# Patient Record
Sex: Female | Born: 1970
Health system: Southern US, Community
[De-identification: ages and names within clinical notes are randomized; demographics above are authoritative.]

## PROBLEM LIST (undated history)

## (undated) DIAGNOSIS — I2699 Other pulmonary embolism without acute cor pulmonale: Secondary | ICD-10-CM

## (undated) DIAGNOSIS — I82409 Acute embolism and thrombosis of unspecified deep veins of unspecified lower extremity: Secondary | ICD-10-CM

## (undated) DIAGNOSIS — R079 Chest pain, unspecified: Secondary | ICD-10-CM

## (undated) HISTORY — DX: Chest pain, unspecified: R07.9

## (undated) HISTORY — PX: NO PAST SURGERIES: SHX2092

---

## 1999-04-27 ENCOUNTER — Other Ambulatory Visit: Admission: RE | Admit: 1999-04-27 | Discharge: 1999-04-27 | Payer: Self-pay | Admitting: Obstetrics & Gynecology

## 2000-05-11 ENCOUNTER — Other Ambulatory Visit: Admission: RE | Admit: 2000-05-11 | Discharge: 2000-05-11 | Payer: Self-pay | Admitting: Obstetrics & Gynecology

## 2002-11-07 ENCOUNTER — Other Ambulatory Visit: Admission: RE | Admit: 2002-11-07 | Discharge: 2002-11-07 | Payer: Self-pay | Admitting: Obstetrics & Gynecology

## 2002-11-12 ENCOUNTER — Encounter: Admission: RE | Admit: 2002-11-12 | Discharge: 2002-11-12 | Payer: Self-pay | Admitting: Obstetrics & Gynecology

## 2002-11-12 ENCOUNTER — Encounter: Payer: Self-pay | Admitting: Obstetrics & Gynecology

## 2003-12-11 ENCOUNTER — Other Ambulatory Visit: Admission: RE | Admit: 2003-12-11 | Discharge: 2003-12-11 | Payer: Self-pay | Admitting: Obstetrics & Gynecology

## 2005-08-09 ENCOUNTER — Ambulatory Visit (HOSPITAL_COMMUNITY): Admission: RE | Admit: 2005-08-09 | Discharge: 2005-08-09 | Payer: Self-pay | Admitting: Obstetrics & Gynecology

## 2010-02-24 ENCOUNTER — Encounter: Admission: RE | Admit: 2010-02-24 | Discharge: 2010-02-24 | Payer: Self-pay | Admitting: Obstetrics & Gynecology

## 2013-07-03 ENCOUNTER — Emergency Department (INDEPENDENT_AMBULATORY_CARE_PROVIDER_SITE_OTHER)
Admission: EM | Admit: 2013-07-03 | Discharge: 2013-07-03 | Disposition: A | Discharge status: Hospice | Payer: Self-pay | Source: Home / Self Care | Attending: Emergency Medicine | Admitting: Emergency Medicine

## 2013-07-03 ENCOUNTER — Encounter (HOSPITAL_COMMUNITY): Payer: Self-pay | Admitting: *Deleted

## 2013-07-03 ENCOUNTER — Emergency Department (HOSPITAL_COMMUNITY)
Admission: EM | Admit: 2013-07-03 | Discharge: 2013-07-04 | Disposition: A | Discharge status: Home / Self Care | Payer: Self-pay | Attending: Emergency Medicine | Admitting: Emergency Medicine

## 2013-07-03 DIAGNOSIS — Z79899 Other long term (current) drug therapy: Secondary | ICD-10-CM | POA: Insufficient documentation

## 2013-07-03 DIAGNOSIS — M7989 Other specified soft tissue disorders: Secondary | ICD-10-CM | POA: Insufficient documentation

## 2013-07-03 LAB — CBC WITH DIFFERENTIAL/PLATELET
Basophils Relative: 0 % (ref 0–1)
Eosinophils Absolute: 0.4 10*3/uL (ref 0.0–0.7)
HCT: 32.3 % — ABNORMAL LOW (ref 36.0–46.0)
Hemoglobin: 11.3 g/dL — ABNORMAL LOW (ref 12.0–15.0)
MCH: 29.4 pg (ref 26.0–34.0)
MCHC: 35 g/dL (ref 30.0–36.0)
Monocytes Absolute: 0.7 10*3/uL (ref 0.1–1.0)
Monocytes Relative: 8 % (ref 3–12)

## 2013-07-03 LAB — COMPREHENSIVE METABOLIC PANEL
Albumin: 3.7 g/dL (ref 3.5–5.2)
BUN: 11 mg/dL (ref 6–23)
Creatinine, Ser: 0.62 mg/dL (ref 0.50–1.10)
Total Bilirubin: 0.3 mg/dL (ref 0.3–1.2)
Total Protein: 7.4 g/dL (ref 6.0–8.3)

## 2013-07-03 LAB — POCT I-STAT TROPONIN I: Troponin i, poc: 0.01 ng/mL (ref 0.00–0.08)

## 2013-07-03 NOTE — ED Provider Notes (Addendum)
  CSN: [id number]     Arrival date & time 07/03/13  1702 History     First MD Initiated Contact with Patient 07/03/13 1901     Chief Complaint  Patient presents with  . Leg Swelling   (Consider location/radiation/quality/duration/timing/severity/associated sxs/prior Treatment) HPI Comments: Patient presents urgent care with left lower leg swelling and pain has been worsening over the last 3 weeks. Initially patient was diagnosed and treated for gout including chiropractor sessions. She has noticed that her leg has gotten progressively worse with more pain and swelling in described that her mother had a DVT and a pulmonary embolism and have recommended she come to urgent care tonight to be worked up for a DVT.  Patient at this point denies any chest pains or shortness of breath denies having a personal history of DVT or PE or any blood dyscrasias. She is having more difficulty walking on her leg  Patient is a 42 y.o. female presenting with leg pain. The history is provided by the patient.  Leg Pain Location:  Leg Leg location:  L leg Pain details:    Quality:  Aching   Severity:  Moderate   Onset quality:  Sudden   Timing:  Constant   Progression:  Worsening Chronicity:  New Ineffective treatments:  None tried Associated symptoms: swelling   Associated symptoms: no back pain, no fatigue, no fever and no numbness     No past medical history on file. No past surgical history on file. No family history on file. History  Substance Use Topics  . Smoking status: Not on file  . Smokeless tobacco: Not on file  . Alcohol Use: Not on file   OB History   No data available     Review of Systems  Constitutional: Positive for activity change. Negative for fever and fatigue.  Musculoskeletal: Negative for back pain.    Allergies  Review of patient's allergies indicates no known allergies.  Home Medications   Current Outpatient Rx  Name  Route  Sig  Dispense  Refill  .  acetaminophen (TYLENOL) 325 MG tablet   Oral   Take 650 mg by mouth every 6 (six) hours as needed for pain.          BP 140/91  Pulse 80  Temp(Src) 99.4 F (37.4 C) (Oral)  Resp 18  SpO2 97%  LMP 06/12/2013 Physical Exam  Constitutional: No distress.  HENT:  Head: Normocephalic.  Pulmonary/Chest: Effort normal. No respiratory distress. She has no wheezes. She has no rales.  Musculoskeletal: She exhibits tenderness.       Legs: Skin: No erythema.    ED Course   Procedures (including critical care time)  Labs Reviewed - No data to display No results found. 1. Leg swelling     MDM  Left lower extremity swelling and pain-   Physical exam in retrospect are consistent with a DVT patient will be transferred to the emergency department for further evaluation difficult venous Dopplers to be initiated on the DVT protocol. Consider possibility of extensive DVT patient has a significant circumferential diameter difference. Denies chest pain or dyspnea.  Jimmie Molly, MD 07/03/13 Kristopher Oppenheim  Jimmie Molly, MD 07/03/13 [phone]

## 2013-07-03 NOTE — ED Notes (Signed)
C/o left leg swelling which started a couple of weeks ago.  Patient thought it was gout.  Denies any injury.   OTC medications pain medications taken but no relief.  Patient saw a chiropractor 06/24/13 and was told it was gout

## 2013-07-03 NOTE — ED Notes (Signed)
NURSE FIRST ROUNDS : NURSES UPDATED PT. ON WAIT TIME / DELAY AND PROCESS. ALERT /RESPIRATIONS UNLABORED / DENIES PAIN AT THIS TIME .

## 2013-07-03 NOTE — ED Notes (Signed)
The pts lt leg is swollen and painful for 2 weeks.  She was sent down from [facility] for treatment.  She has also had some sl sob when walking  None now.  lmp July 23rd

## 2013-07-04 ENCOUNTER — Encounter (HOSPITAL_COMMUNITY): Payer: Self-pay | Admitting: Radiology

## 2013-07-04 ENCOUNTER — Ambulatory Visit (HOSPITAL_COMMUNITY)
Admission: RE | Admit: 2013-07-04 | Discharge: 2013-07-04 | Disposition: A | Discharge status: Home / Self Care | Payer: Self-pay | Source: Ambulatory Visit | Attending: Emergency Medicine | Admitting: Emergency Medicine

## 2013-07-04 ENCOUNTER — Emergency Department (HOSPITAL_COMMUNITY): Payer: Self-pay

## 2013-07-04 ENCOUNTER — Inpatient Hospital Stay (HOSPITAL_COMMUNITY)
Admission: EM | Admit: 2013-07-04 | Discharge: 2013-07-05 | DRG: 176 | Disposition: A | Discharge status: Home / Self Care | Payer: MEDICAID | Attending: Internal Medicine | Admitting: Internal Medicine

## 2013-07-04 DIAGNOSIS — D649 Anemia, unspecified: Secondary | ICD-10-CM | POA: Diagnosis present

## 2013-07-04 DIAGNOSIS — I82409 Acute embolism and thrombosis of unspecified deep veins of unspecified lower extremity: Secondary | ICD-10-CM | POA: Diagnosis present

## 2013-07-04 DIAGNOSIS — M7989 Other specified soft tissue disorders: Secondary | ICD-10-CM | POA: Insufficient documentation

## 2013-07-04 DIAGNOSIS — M79609 Pain in unspecified limb: Secondary | ICD-10-CM | POA: Insufficient documentation

## 2013-07-04 DIAGNOSIS — I2699 Other pulmonary embolism without acute cor pulmonale: Principal | ICD-10-CM | POA: Diagnosis present

## 2013-07-04 DIAGNOSIS — I82402 Acute embolism and thrombosis of unspecified deep veins of left lower extremity: Secondary | ICD-10-CM

## 2013-07-04 HISTORY — DX: Acute embolism and thrombosis of unspecified deep veins of unspecified lower extremity: I82.409

## 2013-07-04 HISTORY — DX: Other pulmonary embolism without acute cor pulmonale: I26.99

## 2013-07-04 LAB — PROTIME-INR: Prothrombin Time: 14.5 seconds (ref 11.6–15.2)

## 2013-07-04 MED ORDER — HYDROCODONE-ACETAMINOPHEN 5-325 MG PO TABS
1.0000 | ORAL_TABLET | Freq: Once | ORAL | Status: AC
  Administered 2013-07-04: 1 via ORAL
  Filled 2013-07-04: qty 1

## 2013-07-04 MED ORDER — HYDROCODONE-ACETAMINOPHEN 5-325 MG PO TABS
1.0000 | ORAL_TABLET | ORAL | Status: DC | PRN
  Administered 2013-07-04 – 2013-07-05 (×4): 1 via ORAL
  Filled 2013-07-04 (×4): qty 1

## 2013-07-04 MED ORDER — WARFARIN - PHARMACIST DOSING INPATIENT
Freq: Every day | Status: DC
  Administered 2013-07-04: 18:00:00

## 2013-07-04 MED ORDER — PATIENT'S GUIDE TO USING COUMADIN BOOK
Freq: Once | Status: AC
  Administered 2013-07-04: 18:00:00
  Filled 2013-07-04: qty 1

## 2013-07-04 MED ORDER — ENOXAPARIN SODIUM 120 MG/0.8ML ~~LOC~~ SOLN
110.0000 mg | Freq: Two times a day (BID) | SUBCUTANEOUS | Status: DC
  Administered 2013-07-04 – 2013-07-05 (×2): 110 mg via SUBCUTANEOUS
  Filled 2013-07-04 (×5): qty 0.8

## 2013-07-04 MED ORDER — WARFARIN VIDEO: Freq: Once | Status: DC

## 2013-07-04 MED ORDER — SODIUM CHLORIDE 0.9 % IJ SOLN
3.0000 mL | Freq: Two times a day (BID) | INTRAMUSCULAR | Status: DC
  Administered 2013-07-04 – 2013-07-05 (×2): 3 mL via INTRAVENOUS

## 2013-07-04 MED ORDER — HYDROCODONE-ACETAMINOPHEN 5-325 MG PO TABS: 1.0000 | ORAL_TABLET | Freq: Four times a day (QID) | ORAL | Status: DC | PRN

## 2013-07-04 MED ORDER — WARFARIN SODIUM 7.5 MG PO TABS
7.5000 mg | ORAL_TABLET | Freq: Once | ORAL | Status: AC
  Administered 2013-07-04: 7.5 mg via ORAL
  Filled 2013-07-04: qty 1

## 2013-07-04 MED ORDER — IOHEXOL 350 MG/ML SOLN
100.0000 mL | Freq: Once | INTRAVENOUS | Status: AC | PRN
  Administered 2013-07-04: 100 mL via INTRAVENOUS

## 2013-07-04 MED ORDER — ACETAMINOPHEN 325 MG PO TABS: 650.0000 mg | ORAL_TABLET | Freq: Four times a day (QID) | ORAL | Status: DC | PRN

## 2013-07-04 MED ORDER — OXYCODONE-ACETAMINOPHEN 5-325 MG PO TABS
2.0000 | ORAL_TABLET | Freq: Once | ORAL | Status: AC
  Administered 2013-07-04: 2 via ORAL
  Filled 2013-07-04: qty 2

## 2013-07-04 MED ORDER — ACETAMINOPHEN 650 MG RE SUPP: 650.0000 mg | Freq: Four times a day (QID) | RECTAL | Status: DC | PRN

## 2013-07-04 MED ORDER — ENOXAPARIN SODIUM 100 MG/ML ~~LOC~~ SOLN
1.0000 mg/kg | Freq: Once | SUBCUTANEOUS | Status: AC
  Administered 2013-07-04: 100 mg via SUBCUTANEOUS
  Filled 2013-07-04: qty 1

## 2013-07-04 NOTE — ED Notes (Signed)
MD at bedside. 

## 2013-07-04 NOTE — H&P (Signed)
Date: 07/04/2013               Patient Name:  Kara Mcclain MRN: [id number]  DOB: [birth year] Age / Sex: 42 y.o., female   PCP: No Pcp Per Patient         Medical Service: Internal Medicine Teaching Service         Attending Physician: Dr. Juliet Rude. Rubin Payor, MD    First Contact: Dr. Angelina Sheriff, MD Pager: [phone]  Second Contact: Dr. Dede Query, MD Pager: [phone]       After Hours (After 5p/  First Contact Pager: [phone]  weekends / holidays): Second Contact Pager: [phone]   Chief Complaint: Exertional Dyspnea  History of Present Illness: Kara Mcclain is a 42 y.o. female with no significant past medical history who came to the ED with a chief complaint of exertional dyspnea. The patient describes a 1 month history of left leg leg swelling associated with calf pain. The swelling started shortly after the July 4th and has been slowly progressive since. The lower leg increased in circumference and swelling moved proximally to her mid thigh over the last month. There has been no redness, swelling, or purulence. The patient does not have health insurance so she went online and diagnosed herself with gout. She saw a chiropractor who apparently recommended that she take her sisters colchicine, which did not effect her leg swelling and pain.   Three days ago, the patient noticed that she became short of breath after walking. She normally makes this identical walk without any problems. This made her concerned and se sought medical attention at an urgent care center. The center performed a doppler that identified a left lower extremity DVT. The patient was then instructed to go to the hospital for futher work up.  In the ED, the patient had stable vital signs. CTA diagnosed a PE. The internal medicine was contacted to admit the patient for treatment of DVT c/b PE.  Meds: No current facility-administered medications for this encounter.   Current Outpatient Prescriptions  Medication Sig  Dispense Refill  . acetaminophen (TYLENOL) 325 MG tablet Take 650 mg by mouth every 6 (six) hours as needed for pain.        Allergies: Allergies as of 07/04/2013  . (No Known Allergies)   History reviewed. No pertinent past medical history. No past surgical history on file. No family history on file. History   Social History  . Marital Status: Married    Spouse Name: N/A    Number of Children: N/A  . Years of Education: N/A   Occupational History  . Not on file.   Social History Main Topics  . Smoking status: Never Smoker   . Smokeless tobacco: Not on file  . Alcohol Use: Yes  . Drug Use: Not on file  . Sexual Activity: Not on file   Other Topics Concern  . Not on file   Social History Narrative  . No narrative on file    Review of Systems: Pertinent items are noted in HPI.  Physical Exam: Blood pressure 150/77, pulse 73, temperature 98.2 F (36.8 C), temperature source Oral, resp. rate 19, last menstrual period 06/12/2013, SpO2 100.00%. Physical Exam  Constitutional: She appears well-developed and well-nourished. No distress.  HENT:  Head: Normocephalic.  Eyes: EOM are normal.  Cardiovascular: Normal rate, regular rhythm, normal heart sounds and intact distal pulses.   No murmur heard. Pulmonary/Chest: Effort normal and breath sounds normal. No respiratory distress. She  has no wheezes. She has no rales. She exhibits no tenderness.  Abdominal: Soft. Bowel sounds are normal.  Musculoskeletal: She exhibits edema.  Significant lower extremity edema up to the mid thigh (left).  Skin: No rash noted. She is not diaphoretic. No erythema. No pallor.  Psychiatric: She has a normal mood and affect. Her behavior is normal.   Lab results: Basic Metabolic Panel:  Recent Labs  40/98/11 2040  NA 139  K 3.6  CL 103  CO2 26  GLUCOSE 101*  BUN 11  CREATININE 0.62  CALCIUM 9.3   Liver Function Tests:  Recent Labs  07/03/13 2040  AST 12  ALT 9  ALKPHOS 78    BILITOT 0.3  PROT 7.4  ALBUMIN 3.7   CBC:  Recent Labs  07/03/13 2040  WBC 9.6  NEUTROABS 5.9  HGB 11.3*  HCT 32.3*  MCV 84.1  PLT 367   Coagulation:  Recent Labs  07/04/13 1000  LABPROT 14.5  INR 1.15    Imaging results:  Ct Angio Chest W/cm &/or Wo Cm  07/04/2013   *RADIOLOGY REPORT*  Clinical Data: Shortness of breath; deep venous thrombosis.  CT ANGIOGRAPHY CHEST  Technique:  Multidetector CT imaging of the chest using the standard protocol during bolus administration of intravenous contrast. Multiplanar reconstructed images including MIPs were obtained and reviewed to evaluate the vascular anatomy.  Contrast: OMNIPAQUE IOHEXOL 350 MG/ML SOLN  Comparison: None.  Findings: There is extensive pulmonary embolism involving multiple pulmonary arterial structures in the lower lobe branches bilaterally.  Pulmonary emboli began in the proximal lower lobe branches bilaterally but do not involve the main pulmonary artery on either side.  There is mild bibasilar atelectasis.  There is no frank edema or consolidation. There is underlying centrilobular emphysema with mild lower lobe bronchiectatic change bilaterally.  There is no appreciable thoracic adenopathy. The pericardium is not thickened.  Visualized upper abdominal structures appear within normal limits. There are no blastic or lytic bone lesions.  IMPRESSION: Extensive lower lobe pulmonary emboli bilaterally.  Underlying centrilobular emphysema with mild lower lobe bronchiectasis.  No edema or consolidation.  Comment: Results of this study were communicated directly by phone to Dr.Pickering, emergency department physician, immediately upon review of the study.   Original Report Authenticated By: Bretta Bang, M.D.    Other results: EKG: abnormal EKG, normal sinus rhythm, nonspecific ST and T waves changes, QT prolongated.  Assessment & Plan by Problem:  Assessment Plan  Acute pulmonary embolism 2/2 Acute DVT (deep  venous thrombosis) The patients SOB and leg pain are due to DVT c/b PE. The patient is hemodynamically stable. Plan to admit to floor bed. Plan for sq lovenox to bridge coumadin as it reaches a therapeutic level. As she has no risk factors for DVT, aside from family history, we plan for her PCP to r/o causes of DVT (eg Factor V leiden, Protein C and S deficiency, AT III deficiency). Unfortunately, the acute process of DVT and subsequent PE may cause these factors to be abnormal. The ED ordered these labs and we will f/u accordingly. - Lovenox per pharmacy - Coumadin per pharmacy, goal INR=2-3 - Plan to have nurse teach patient how to administer sq lovenox at home - BMP and CBC - Tylenol prn for mild pain Hydrocodone-APAP for moderate pain prn  Anemia Hg found to be 11.3. We do not have a baseline. Plan to trend CBC to ensure stable. Plan for patient to f/u as outpatient with PCP to workout anemia  if stable. - F/U CBC in am  Nutrition/ DVT Regular Diet/ on lovenox for PE     Dispo: Disposition is deferred at this time, awaiting improvement of current medical problems. Anticipated discharge in approximately 2-3 day(s).   The patient does not have a current PCP (No Pcp Per Patient) and does need an [facility] hospital follow-up appointment after discharge.  The patient does not have transportation limitations that hinder transportation to clinic appointments.  Signed: Pleas Koch, MD 07/04/2013, 12:20 PM

## 2013-07-04 NOTE — ED Notes (Signed)
Pt states she understands discharge instructions.

## 2013-07-04 NOTE — ED Notes (Signed)
Admitting MD at bedside.

## 2013-07-04 NOTE — ED Notes (Addendum)
Pt thought she had gout when she had swelling in left leg for several weeks. Swelling did not get better and started to move from ankle to calf. Normal walk to work was extremely painful. Left leg is swollen at this time. Pt not on BC pills or smoker.

## 2013-07-04 NOTE — ED Notes (Signed)
Patient transported to CT 

## 2013-07-04 NOTE — Progress Notes (Signed)
*  Preliminary Results* Left lower extremity venous duplex completed. Left lower extremity is positive for deep vein thrombosis involving the left mid femoral vein, left popliteal vein, left posterior tibial veins, and left peroneal veins. There is no evidence of left Baker's cyst.  Preliminary results discussed with Dr.Pickering and he has advised that the patient be admitted to the ED.  07/04/2013 9:17 AM  Gertie Fey, RVT, RDCS, RDMS

## 2013-07-04 NOTE — ED Provider Notes (Signed)
CSN: [id number]     Arrival date & time 07/03/13  2004 History     None    Chief Complaint  Patient presents with  . Leg Pain   (Consider location/radiation/quality/duration/timing/severity/associated sxs/prior Treatment) HPI   Kara Mcclain is a 42 y/o F iiwth 2 weeks of LLE swelling and pain. + family history of DVT. Denies HX SOB, dyspnea or pleurisy. Patient is not on any exogenous estrogens. Sent from [facility] for DVT r/o. Denies fevers, chills, myalgias, arthralgias. Denies, chest tightness or pressure, radiation to left arm, jaw or back, or diaphoresis. Denies dysuria, flank pain, suprapubic pain, frequency, urgency, or hematuria. Denies headaches, light headedness, weakness, visual disturbances. Denies abdominal pain, nausea, vomiting, diarrhea or constipation.    No past medical history on file. History reviewed. No pertinent past surgical history. No family history on file. History  Substance Use Topics  . Smoking status: Never Smoker   . Smokeless tobacco: Not on file  . Alcohol Use: Yes   OB History   Grav Para Term Preterm Abortions TAB SAB Ect Mult Living                 Review of Systems Ten systems reviewed and are negative for acute change, except as noted in the HPI.   Allergies  Review of patient's allergies indicates no known allergies.  Home Medications   Current Outpatient Rx  Name  Route  Sig  Dispense  Refill  . acetaminophen (TYLENOL) 325 MG tablet   Oral   Take 650 mg by mouth every 6 (six) hours as needed for pain.         Marland Kitchen COLCHICINE PO   Oral   Take 1 tablet by mouth daily.          BP 146/81  Pulse 73  Temp(Src) 99.3 F (37.4 C) (Oral)  Resp 14  Ht 5\' 11"  (1.803 m)  Wt 224 lb (101.606 kg)  BMI 31.26 kg/m2  SpO2 100%  LMP 06/12/2013 Physical Exam Physical Exam  Nursing note and vitals reviewed. Constitutional: She is oriented to person, place, and time. She appears well-developed and well-nourished. No distress.  HENT:   Head: Normocephalic and atraumatic.  Eyes: Conjunctivae normal and EOM are normal. Pupils are equal, round, and reactive to light. No scleral icterus.  Neck: Normal range of motion.  Cardiovascular: Normal rate, regular rhythm and normal heart sounds.  Exam reveals no gallop and no friction rub.   No murmur heard. LLE swelling, warmth Redness, Homan's Positive, 2+ pitting Edema. Pulmonary/Chest: Effort normal and breath sounds normal. No respiratory distress.  Abdominal: Soft. Bowel sounds are normal. She exhibits no distension and no mass. There is no tenderness. There is no guarding.  Neurological: She is alert and oriented to person, place, and time.  Skin: Skin is warm and dry. She is not diaphoretic.    ED Course   Procedures (including critical care time)  Labs Reviewed  CBC WITH DIFFERENTIAL - Abnormal; Notable for the following:    RBC 3.84 (*)    Hemoglobin 11.3 (*)    HCT 32.3 (*)    All other components within normal limits  COMPREHENSIVE METABOLIC PANEL - Abnormal; Notable for the following:    Glucose, Bld 101 (*)    All other components within normal limits  POCT I-STAT TROPONIN I   No results found. No diagnosis found.  MDM  Patient with LLE swelling concerning for DVT. I have covered the patient with Sub Q lovenox. She  will return to the [place] department first thing in the morning for US of the LEg.  I explained risks of blood clot including PE and Death. Patient02 ssaturations normalwithout dypnea or cp. Patient appears safe for discharge.  Arthor Captain, PA-C 07/14/13 [phone]

## 2013-07-04 NOTE — Progress Notes (Signed)
Report received for transfer.

## 2013-07-04 NOTE — Progress Notes (Signed)
Chaplain visited pt in response to a spiritual care request. Pt was awake alert and responsive. Chaplain provided listened empathically to pt and prayed with her at her request. Pt thanked chaplain for his visit, presence and prayer. Kelle Darting [phone]   07/04/13 1708  Clinical Encounter Type  Visited With Patient  Visit Type Initial;Spiritual support  Referral From Patient  Consult/Referral To Chaplain  Spiritual Encounters  Spiritual Needs Prayer  Stress Factors  Patient Stress Factors None identified

## 2013-07-04 NOTE — ED Notes (Signed)
Kara Mcclain. Patient was given the orange card application to establish Primary Care and to follow up after discharging from the hospital.  Patient does have my contact information for any questions or concerns.

## 2013-07-04 NOTE — Progress Notes (Signed)
Attempted to receive report from transferring RN. Unable to take call; awaiting call back. Kara Mcclain

## 2013-07-04 NOTE — Progress Notes (Signed)
ANTICOAGULATION CONSULT NOTE - Initial Consult  Pharmacy Consult for Lovenox and Coumadin Indication: pulmonary embolus and DVT  No Known Allergies  Patient Measurements: Height: 5' 11.5" (181.6 cm) Weight: 245 lb 13 oz (111.5 kg) IBW/kg (Calculated) : 71.95  Vital Signs: Temp: 98.8 F (37.1 C) (08/14 1333) Temp src: Oral (08/14 1333) BP: 142/79 mmHg (08/14 1333) Pulse Rate: 70 (08/14 1333)  Labs:  Recent Labs  07/03/13 2040 07/04/13 1000  HGB 11.3*  --   HCT 32.3*  --   PLT 367  --   LABPROT  --  14.5  INR  --  1.15  CREATININE 0.62  --     Estimated Creatinine Clearance: 129.6 ml/min (by C-G formula based on Cr of 0.62).   Medical History: History reviewed. No pertinent past medical history.  Assessment:  42 yr old woman, presented to ED with exertional dyspnea, worse over the last few days. Also noted to have had left leg swelling for ~1 month, which had slowly progressed.  Found to have extensive bilateral lower lobe PE and left leg DVT.  Hypercoag panel sent.   To begin Lovenox and Coumadin.  Expect at least 5-day overlap, and at least until INR >2 x 2 consecutive days.    Goal of Therapy:  INR 2-3 Anti-Xa level 0.6-1.2 units/ml 4hrs after LMWH dose given Monitor platelets by anticoagulation protocol: Yes   Plan:    Lovenox 110 mg SQ q12hrs.   Coumadin 10 mg today.   Daily PT/INR.  CBC at least q72 hrs.   Coumadin education prior to discharge.   Patient to be instructed on self-administration of Lovenox.  Dennie Fetters, [place] Pager: [phone] 07/04/2013,2:37 PM

## 2013-07-04 NOTE — Progress Notes (Signed)
Patient states that she is familiar with coumadin, diet, and INR d/t her mother being on coumadin. She refused  Coumadin teaching/video at this time.Kara Mcclain

## 2013-07-05 ENCOUNTER — Other Ambulatory Visit: Payer: Self-pay | Admitting: Internal Medicine

## 2013-07-05 DIAGNOSIS — I82409 Acute embolism and thrombosis of unspecified deep veins of unspecified lower extremity: Secondary | ICD-10-CM

## 2013-07-05 LAB — LUPUS ANTICOAGULANT PANEL: DRVVT: 36.5 secs (ref <42.9)

## 2013-07-05 LAB — CARDIOLIPIN ANTIBODIES, IGG, IGM, IGA
Anticardiolipin IgA: 1 APL U/mL — ABNORMAL LOW (ref <22)
Anticardiolipin IgM: 0 MPL U/mL — ABNORMAL LOW (ref <11)

## 2013-07-05 LAB — PROTIME-INR
INR: 1.16 (ref 0.00–1.49)
Prothrombin Time: 14.6 seconds (ref 11.6–15.2)

## 2013-07-05 LAB — BASIC METABOLIC PANEL
BUN: 10 mg/dL (ref 6–23)
Chloride: 104 mEq/L (ref 96–112)
GFR calc Af Amer: >90 mL/min (ref >90)
Potassium: 3.7 mEq/L (ref 3.5–5.1)
Sodium: 138 mEq/L (ref 135–145)

## 2013-07-05 LAB — PROTEIN C ACTIVITY: Protein C Activity: 79 % (ref 75–133)

## 2013-07-05 LAB — CBC
HCT: 31.6 % — ABNORMAL LOW (ref 36.0–46.0)
RDW: 13.2 % (ref 11.5–15.5)
WBC: 7.4 10*3/uL (ref 4.0–10.5)

## 2013-07-05 LAB — BETA-2-GLYCOPROTEIN I ABS, IGG/M/A: Beta-2-Glycoprotein I IgM: 1 M Units (ref <20)

## 2013-07-05 LAB — RAPID URINE DRUG SCREEN, HOSP PERFORMED
Cocaine: NOT DETECTED
Opiates: POSITIVE — AB
Tetrahydrocannabinol: POSITIVE — AB

## 2013-07-05 LAB — PROTHROMBIN GENE MUTATION

## 2013-07-05 LAB — PROTEIN C, TOTAL: Protein C, Total: 91 % (ref 72–160)

## 2013-07-05 MED ORDER — HYDROCODONE-ACETAMINOPHEN 5-325 MG PO TABS: 1.0000 | ORAL_TABLET | ORAL | Status: DC | PRN

## 2013-07-05 MED ORDER — WARFARIN SODIUM 10 MG PO TABS
10.0000 mg | ORAL_TABLET | Freq: Once | ORAL | Status: AC
  Administered 2013-07-05: 10 mg via ORAL
  Filled 2013-07-05: qty 1

## 2013-07-05 MED ORDER — ENOXAPARIN SODIUM 120 MG/0.8ML ~~LOC~~ SOLN: 110.0000 mg | Freq: Two times a day (BID) | SUBCUTANEOUS | Status: DC

## 2013-07-05 MED ORDER — WARFARIN SODIUM 5 MG PO TABS: 5.0000 mg | ORAL_TABLET | Freq: Every day | ORAL | Status: DC

## 2013-07-05 NOTE — Care Management Note (Signed)
    Page 1 of 1   07/05/2013     3:33:45 PM   CARE MANAGEMENT NOTE 07/05/2013  Patient:  Kara Mcclain, Kara Mcclain   Account Number:  [id number]  Date Initiated:  07/05/2013  Documentation initiated by:  Ryah Cribb  Subjective/Objective Assessment:   PT ADM ON 07/05/13 WITH BILAT MULT PULM EMBOLI.  PTA, PT INDEPENDENT OF ADLS.     Action/Plan:   CM REFERRAL FOR MEDICATION ASSISTANCE.  PT IS UNINSURED, AND WILL DC HOME ON LOVENOX THERAPY.  SHE IS ELIGIBLE FOR [facility] MATCH PROGRAM.   Anticipated DC Date:  07/05/2013   Anticipated DC Plan:  HOME W HOME HEALTH SERVICES      DC Planning Services  CM consult  MATCH Program      [facility] Choice  HOME HEALTH   Choice offered to / List presented to:          [facility] arranged  HH - 11 Patient Refused      Status of service:  Completed, signed off Medicare Important Message given?   (If response is "NO", the following Medicare IM given date fields will be blank) Date Medicare IM given:   Date Additional Medicare IM given:    Discharge Disposition:  HOME/SELF CARE  Per UR Regulation:  Reviewed for med. necessity/level of care/duration of stay  If discussed at Long Length of Stay Meetings, dates discussed:    Comments:  07/05/13 Ziyana Morikawa,RN,BSN [phone] PT GIVEN MATCH LETTER WITH EXPLANATION OF PROGRAM BENEFITS. PT STATES SHE CAN SELF INJECT; HAS GIVEN LOVENOX TO HER MOM.  BEDSIDE RN TO PROVIDE TEACHING WITH 4PM DOSE.  SHE POLITELY DECLINES HHRN SET UP, AS SHE STATES SHE WILL HAVE NO PROB WITH INJECTIONS.  FAXED ALL DC RX TO [facility], [id number].

## 2013-07-05 NOTE — H&P (Signed)
INTERNAL MEDICINE TEACHING SERVICE Attending Admission Note  Date: 07/05/2013  Patient name: ALENE BERGERSON  Medical record number: [id number]  Date of birth: 10/08/1971    I have seen and evaluated Si Gaul and discussed their care with the Residency Team.   42 yr. Old AAF w/ no previous medical history with SOB on exertion and leg pain/swelling. She was found to have evidence of DVT and bilateral lower lobe PE's.  She is hemodynamically stable without evidence of hypoxia. No hx of provoking event and uncertain family history. She will need to be D/C on lovenox 1 mg/kg bid for at least 5 days and coumadin until INR therapeutic 2 days in a row. She will need follow up with Dr. Alexandria Lodge on Monday for INR check. She can then decide if she wants one of the newer agents, affordability may be an issue. She is eager to go home. She may be D/C from a medical standpoint if Lovenox/coumadin arranged for home. Risks/benefits explained to patient as well as alternatives to current tx and patient agrees to proceed.  She will need a PCP, she may establish in our clinic.  Jonah Blue, [place] 8/15/20141:52 PM

## 2013-07-05 NOTE — Plan of Care (Signed)
Problem: Food- and Nutrition-Related Knowledge Deficit (NB-1.1) Goal: Nutrition education Formal process to instruct or train a patient/client in a skill or to impart knowledge to help patients/clients voluntarily manage or modify food choices and eating behavior to maintain or improve health. Outcome: Completed/Met Date Met:  07/05/13 RD consulted for diet edu re: Vitamin K.   Pt provided with Vitamin K and Medication hand out from [facility]. Discussed need to have consistent intake. Pt verbalized understanding. Denied any additional nutrition questions at this time.  Body mass index is 33.81 kg/(m^2). Overweight Diet: Regular  Chart reviewed, no additional nutrition interventions warranted at this time. Please re-consult as needed.   Clarene Duke RD, LDN Pager [phone] After Hours pager [phone]

## 2013-07-05 NOTE — Progress Notes (Signed)
Patient stated she was comfortable self administering lovenox at home. Unable to demonstrate administration at this time; next dose due 4pm. Patient was able to verbalize administration to Rn. Will re-evaluate.Kara Mcclain

## 2013-07-05 NOTE — Progress Notes (Signed)
ANTICOAGULATION CONSULT NOTE - Follow Up Consult  Pharmacy Consult for Lovenox and Coumadin Indication: pulmonary embolus and DVT  No Known Allergies  Patient Measurements: Height: 5' 11.5" (181.6 cm) Weight: 245 lb 13 oz (111.5 kg) IBW/kg (Calculated) : 71.95  Vital Signs: Temp: 98.3 F (36.8 C) (08/15 1330) Temp src: Oral (08/15 1330) BP: 123/79 mmHg (08/15 1330) Pulse Rate: 80 (08/15 1330)  Labs:  Recent Labs  07/03/13 2040 07/04/13 1000 07/05/13 0430  HGB 11.3*  --  10.8*  HCT 32.3*  --  31.6*  PLT 367  --  360  LABPROT  --  14.5 14.6  INR  --  1.15 1.16  CREATININE 0.62  --  0.65    Estimated Creatinine Clearance: 129.6 ml/min (by C-G formula based on Cr of 0.65).  Assessment:  Day # 2 51f 7 minimum overlap of Lovenox and Coumadin for PE and left leg DVT.  For discharge later today.  Patient to self-administer next Lovenox injection, ~4pm.  INR unchanged after 1st dose of Coumadin.  Intended 10 mg yesterday, but mistakenly entered 7.5 mg dose, so 7.5 mg dose given.  Today's dose of Coumadin 10 mg to be given prior to discharge.   Lovenox Rx to be given.  Discussed Coumadin with patient and her mother, including precautions, monitoring, potential drug-drug and drug-food interactions. Also discussed potential teratogenic effects of Coumadin, and need to avoid pregnancy while on Coumadin.  Goal of Therapy:  INR 2-3 Anti-Xa level 0.6-1.2 units/ml 4hrs after LMWH dose given Monitor platelets by anticoagulation protocol: Yes   Plan:   Coumadin 10 mg today.  Continue Lovenox 110 mg SQ q12hrs.  Discussed discharge plans with Dr. Glendell Docker.  To be given Rx for Coumadin 5 mg tablets.  Suggested 10 mg dose on 8/16, then 7.5 mg on 8/17.  For follow-up PT/INR on 8/18.   Lovenox 110 mg SQ q12hrs to continue, through at least 8/18, and until INR >2.  Case Manager assisting with Lovenox acquistion.  Dennie Fetters, [place] Pager: [phone] 07/05/2013,2:02 PM

## 2013-07-05 NOTE — Discharge Summary (Signed)
Name: Kara Mcclain MRN: [id number] DOB: Apr 27, 1971 42 y.o. PCP: No Pcp Per Patient  Date of Admission: 07/04/2013  9:24 AM Date of Discharge: 07/05/2013 Attending Physician: Jonah Blue, DO  Discharge Diagnosis: 1. Acute pulmonary embolism 2. Acute DVT (deep venous thrombosis)  Discharge Medications:   Medication List         acetaminophen 325 MG tablet  Commonly known as:  TYLENOL  Take 650 mg by mouth every 6 (six) hours as needed for pain.     enoxaparin 120 MG/0.8ML injection  Commonly known as:  LOVENOX  Inject 0.73 mL (110 mg total) into the skin every 12 (twelve) hours.     HYDROcodone-acetaminophen 5-325 MG per tablet  Commonly known as:  NORCO/VICODIN  Take 1 tablet by mouth every 4 (four) hours as needed.     warfarin 5 MG tablet  Commonly known as:  COUMADIN  Take 1 tablet (5 mg total) by mouth daily. Take two tablets (10 mg) on Saturday. Take one and a half tablets (7.5 mg) on Sunday. Do no take any additional tablets until directed by Dr. Alexandria Lodge on Monday.        Disposition and follow-up:   Kara Mcclain was discharged from [facility] in Good condition.  At the hospital follow up visit please address:  1.  DVT c/b PE  2.  Labs / imaging needed at time of follow-up: PT INR  3.  Pending labs/ test needing follow-up: None  Follow-up Appointments:   Discharge Instructions:     Discharge Orders   Future Appointments Provider Department Dept Phone   07/08/2013 1:15 PM Dow Adolph, MD [facility] [phone]   07/08/2013 2:00 PM [facility] [phone]   07/31/2013 1:15 PM Willodean Rosenthal, MD [facility] [phone]   Future Orders Complete By Expires   Call MD for:  difficulty breathing, headache or visual disturbances  As directed    Call MD for:  extreme fatigue  As directed    Call MD for:  persistant dizziness or  light-headedness  As directed    Call MD for:  severe uncontrolled pain  As directed    Call MD for:  As directed    Diet - low sodium heart healthy  As directed    Discharge instructions  As directed    Comments:     You have been diasgnosed with a clot in your leg and clots in both of your lungs. We have initiated treatment with lovenox injections and warfarin. These are blood thinners that prevent the clot from enlarging. You will require a prolonged treatment with warfarin. We will stop the lovenox injections once your warfarin has reached a therapeutic level.   Increase activity slowly  As directed      Procedures Performed:  Ct Angio Chest W/cm &/or Wo Cm  07/04/2013   *RADIOLOGY REPORT*  Clinical Data: Shortness of breath; deep venous thrombosis.  CT ANGIOGRAPHY CHEST  Technique:  Multidetector CT imaging of the chest using the standard protocol during bolus administration of intravenous contrast. Multiplanar reconstructed images including MIPs were obtained and reviewed to evaluate the vascular anatomy.  Contrast: OMNIPAQUE IOHEXOL 350 MG/ML SOLN  Comparison: None.  Findings: There is extensive pulmonary embolism involving multiple pulmonary arterial structures in the lower lobe branches bilaterally.  Pulmonary emboli began in the proximal lower lobe branches bilaterally but do not involve the main pulmonary artery on either side.  There is mild bibasilar atelectasis.  There is no frank edema or consolidation. There is underlying centrilobular emphysema with mild lower lobe bronchiectatic change bilaterally.  There is no appreciable thoracic adenopathy. The pericardium is not thickened.  Visualized upper abdominal structures appear within normal limits. There are no blastic or lytic bone lesions.  IMPRESSION: Extensive lower lobe pulmonary emboli bilaterally.  Underlying centrilobular emphysema with mild lower lobe bronchiectasis.  No edema or consolidation.  Comment: Results of this study  were communicated directly by phone to Dr.Pickering, emergency department physician, immediately upon review of the study.   Original Report Authenticated By: Bretta Bang, M.D.   Admission HPI: Kara Mcclain is a 42 y.o. female with no significant past medical history who came to the ED with a chief complaint of exertional dyspnea. The patient describes a 1 month history of left leg leg swelling associated with calf pain. The swelling started shortly after the July 4th and has been slowly progressive since. The lower leg increased in circumference and swelling moved proximally to her mid thigh over the last month. There has been no redness, swelling, or purulence. The patient does not have health insurance so she went online and diagnosed herself with gout. She saw a chiropractor who apparently recommended that she take her sisters colchicine, which did not effect her leg swelling and pain.  Three days ago, the patient noticed that she became short of breath after walking. She normally makes this identical walk without any problems. This made her concerned and se sought medical attention at an urgent care center. The center performed a doppler that identified a left lower extremity DVT. The patient was then instructed to go to the hospital for futher work up.  In the ED, the patient had stable vital signs. CTA diagnosed a PE. The internal medicine was contacted to admit the patient for treatment of DVT c/b PE.   Hospital Course by problem list: Principal Problem:   Acute pulmonary embolism Active Problems:   Acute DVT (deep venous thrombosis)   1. Acute pulmonary embolism 2/2 Acute DVT (deep venous thrombosis)  The patients SOB and leg pain are due to DVT c/b PE. The patient is hemodynamically stable. Plan to admit to floor bed. Plan for sq lovenox to bridge coumadin as it reaches a therapeutic level. As she has no risk factors for DVT, aside from family history, we plan for her PCP to r/o causes  of DVT (eg Factor V leiden, Protein C and S deficiency, AT III deficiency). Unfortunately, the acute process of DVT and subsequent PE may cause these factors to be abnormal. The ED ordered these labs and we will f/u accordingly. The patient was started on treatment dose of sq lovenox and po coumadin. The patient was instructed how to administer her lovenox injections. The patient was able to perform these injections. Furthermore, she was instructed on coumadin therapy. She was prescribed coumadin 5 mg tablets and instructed to take 10 mg on Sat and 7.5 mg on Sunday, per pharmacy's reccomendation. The patient was discharged home and scheduled for follow-up with Dr. Alexandria Lodge on Monday 8/18 in his coumadin clinic.   Discharge Vitals:   BP 126/78  Pulse 85  Temp(Src) 99 F (37.2 C) (Oral)  Resp 18  Ht 5' 11.5" (1.816 m)  Wt 245 lb 13 oz (111.5 kg)  BMI 33.81 kg/m2  SpO2 99%  LMP 06/12/2013  Discharge Labs:  Results for orders placed during the hospital encounter of 07/04/13 (from the past  24 hour(s))  CBC     Status: Abnormal   Collection Time    07/05/13  4:30 AM      Result Value Range   WBC 7.4  4.0 - 10.5 K/uL   RBC 3.77 (*) 3.87 - 5.11 MIL/uL   Hemoglobin 10.8 (*) 12.0 - 15.0 g/dL   HCT 40.9 (*) 81.1 - 91.4 %   MCV 83.8  78.0 - 100.0 fL   MCH 28.6  26.0 - 34.0 pg   MCHC 34.2  30.0 - 36.0 g/dL   RDW 78.2  95.6 - 21.3 %   Platelets 360  150 - 400 K/uL  BASIC METABOLIC PANEL     Status: None   Collection Time    07/05/13  4:30 AM      Result Value Range   Sodium 138  135 - 145 mEq/L   Potassium 3.7  3.5 - 5.1 mEq/L   Chloride 104  96 - 112 mEq/L   CO2 26  19 - 32 mEq/L   Glucose, Bld 98  70 - 99 mg/dL   BUN 10  6 - 23 mg/dL   Creatinine, Ser 0.86  0.50 - 1.10 mg/dL   Calcium 9.1  8.4 - 57.8 mg/dL   GFR calc non Af Amer >90  >90 mL/min   GFR calc Af Amer >90  >90 mL/min  PROTIME-INR     Status: None   Collection Time    07/05/13  4:30 AM      Result Value Range   Prothrombin  Time 14.6  11.6 - 15.2 seconds   INR 1.16  0.00 - 1.49  URINE RAPID DRUG SCREEN (HOSP PERFORMED)     Status: Abnormal   Collection Time    07/05/13  8:14 AM      Result Value Range   Opiates POSITIVE (*) NONE DETECTED   Cocaine NONE DETECTED  NONE DETECTED   Benzodiazepines NONE DETECTED  NONE DETECTED   Amphetamines NONE DETECTED  NONE DETECTED   Tetrahydrocannabinol POSITIVE (*) NONE DETECTED   Barbiturates NONE DETECTED  NONE DETECTED    Signed: Pleas Koch, MD 07/05/2013, 1:53 PM   Time Spent on Discharge: 20 minutes Services Ordered on Discharge: None Equipment Ordered on Discharge: None

## 2013-07-05 NOTE — Progress Notes (Signed)
Subjective:  NAE overnight. Is learning how to give herself lovenox injections.  Objective: Vital signs in last 24 hours: Filed Vitals:   07/04/13 1300 07/04/13 1333 07/04/13 2042 07/05/13 0515  BP: 125/67 142/79 119/80 126/78  Pulse: 59 70 79 85  Temp:  98.8 F (37.1 C) 98 F (36.7 C) 99 F (37.2 C)  TempSrc:  Oral Oral Oral  Resp: 16 16 18 18   Height:  5' 11.5" (1.816 m)    Weight:  245 lb 13 oz (111.5 kg)    SpO2: 100% 100% 100% 99%   Weight change:   Intake/Output Summary (Last 24 hours) at 07/05/13 9562 Last data filed at 07/04/13 2242  Gross per 24 hour  Intake    240 ml  Output    150 ml  Net     90 ml  Physical Exam  Constitutional: She appears well-developed and well-nourished. No distress.  HENT:  Head: Normocephalic.  Eyes: EOM are normal.  Cardiovascular: Normal rate, regular rhythm, normal heart sounds and intact distal pulses.  No murmur heard.  Pulmonary/Chest: Effort normal and breath sounds normal. No respiratory distress. She has no wheezes. She has no rales. She exhibits no tenderness.  Abdominal: Soft. Bowel sounds are normal.  Musculoskeletal: She exhibits edema.  Significant lower extremity edema up to the mid thigh (left).  Skin: No rash noted. She is not diaphoretic. No erythema. No pallor.  Psychiatric: She has a normal mood and affect. Her behavior is normal.    Lab Results: Basic Metabolic Panel:  Recent Labs Lab 07/03/13 2040 07/05/13 0430  NA 139 138  K 3.6 3.7  CL 103 104  CO2 26 26  GLUCOSE 101* 98  BUN 11 10  CREATININE 0.62 0.65  CALCIUM 9.3 9.1   Liver Function Tests:  Recent Labs Lab 07/03/13 2040  AST 12  ALT 9  ALKPHOS 78  BILITOT 0.3  PROT 7.4  ALBUMIN 3.7   No results found for this basename: LIPASE, AMYLASE,  in the last 168 hours No results found for this basename: AMMONIA,  in the last 168 hours CBC:  Recent Labs Lab 07/03/13 2040 07/05/13 0430  WBC 9.6 7.4  NEUTROABS 5.9  --   HGB 11.3*  10.8*  HCT 32.3* 31.6*  MCV 84.1 83.8  PLT 367 360   Coagulation:  Recent Labs Lab 07/04/13 1000 07/05/13 0430  LABPROT 14.5 14.6  INR 1.15 1.16   Studies/Results: Ct Angio Chest W/cm &/or Wo Cm  07/04/2013   *RADIOLOGY REPORT*  Clinical Data: Shortness of breath; deep venous thrombosis.  CT ANGIOGRAPHY CHEST  Technique:  Multidetector CT imaging of the chest using the standard protocol during bolus administration of intravenous contrast. Multiplanar reconstructed images including MIPs were obtained and reviewed to evaluate the vascular anatomy.  Contrast: OMNIPAQUE IOHEXOL 350 MG/ML SOLN  Comparison: None.  Findings: There is extensive pulmonary embolism involving multiple pulmonary arterial structures in the lower lobe branches bilaterally.  Pulmonary emboli began in the proximal lower lobe branches bilaterally but do not involve the main pulmonary artery on either side.  There is mild bibasilar atelectasis.  There is no frank edema or consolidation. There is underlying centrilobular emphysema with mild lower lobe bronchiectatic change bilaterally.  There is no appreciable thoracic adenopathy. The pericardium is not thickened.  Visualized upper abdominal structures appear within normal limits. There are no blastic or lytic bone lesions.  IMPRESSION: Extensive lower lobe pulmonary emboli bilaterally.  Underlying centrilobular emphysema with mild lower  lobe bronchiectasis.  No edema or consolidation.  Comment: Results of this study were communicated directly by phone to Dr.Pickering, emergency department physician, immediately upon review of the study.   Original Report Authenticated By: Bretta Bang, M.D.   Medications: I have reviewed the patient's current medications. Scheduled Meds: . enoxaparin (LOVENOX) injection  110 mg Subcutaneous Q12H  . sodium chloride  3 mL Intravenous Q12H  . warfarin   Does not apply Once  . Warfarin - Pharmacist Dosing Inpatient   Does not apply q1800     Continuous Infusions:  PRN Meds:.acetaminophen, acetaminophen, HYDROcodone-acetaminophen Assessment/Plan: Principal Problem:   Acute pulmonary embolism Active Problems:   Acute DVT (deep venous thrombosis)  Assessment & Plan by Problem:  Assessment  Plan   Acute pulmonary embolism 2/2 Acute DVT (deep venous thrombosis)  The patients SOB and leg pain are due to DVT c/b PE. The patient is hemodynamically stable. Plan to admit to floor bed. Plan for sq lovenox to bridge coumadin as it reaches a therapeutic level. As she has no risk factors for DVT, aside from family history, we plan for her PCP to r/o causes of DVT (eg Factor V leiden, Protein C and S deficiency, AT III deficiency). Unfortunately, the acute process of DVT and subsequent PE may cause these factors to be abnormal. The ED ordered these labs and we will f/u accordingly.  - Lovenox per pharmacy  - Coumadin per pharmacy, goal INR=2-3  - Plan to have nurse teach patient how to administer sq lovenox at home  - BMP and CBC  - Tylenol prn for mild pain Hydrocodone-APAP for moderate pain prn   -  Anemia  Hg found to be 11.3. We do not have a baseline. Plan to trend CBC to ensure stable. Plan for patient to f/u as outpatient with PCP to workout anemia if stable.  - CBC in am 10.8, stable  Nutrition/ DVT  Regular Diet/ on lovenox for PE       Dispo: Disposition is deferred at this time, awaiting improvement of current medical problems.  Anticipated discharge in approximately 2-3 day(s).   The patient does not have a current PCP (No Pcp Per Patient) and does need an [facility] hospital follow-up appointment after discharge.  The patient does not have transportation limitations that hinder transportation to clinic appointments.  .Services Needed at time of discharge: Y = Yes, Blank = No PT:   OT:   RN:   Equipment:   Other:     LOS: 1 day   Pleas Koch, MD 07/05/2013, 6:49 AM

## 2013-07-05 NOTE — Progress Notes (Signed)
CSW received referral for assistance with medication. CSW referred this to Case Manager who will follow up patient and assist. CSW signing off. Please re consult if social work needs arise.  Maree Krabbe, MSW, Theresia Majors [phone]

## 2013-07-05 NOTE — Progress Notes (Signed)
See my separate note.

## 2013-07-05 NOTE — Progress Notes (Signed)
Patient able to self administer lovenox shot w/out difficulty. Verbalized understanding of education. D/c instructions provided. IV d/c'd. Patient transported via wheelchair to lobby for d/c home. Belongings with patient. Medications were picked up by patient's mom from outpt pharmacy. Kara Mcclain

## 2013-07-05 NOTE — Progress Notes (Signed)
CSW attempted to speak to patient. Has visitor by bedside. CSW will visit patient again later today or pass on to weekend CSW.  Maree Krabbe, MSW, Theresia Majors [phone]

## 2013-07-08 ENCOUNTER — Ambulatory Visit (INDEPENDENT_AMBULATORY_CARE_PROVIDER_SITE_OTHER): Payer: Self-pay | Admitting: Pharmacist

## 2013-07-08 ENCOUNTER — Encounter: Payer: Self-pay | Admitting: Internal Medicine

## 2013-07-08 ENCOUNTER — Ambulatory Visit (INDEPENDENT_AMBULATORY_CARE_PROVIDER_SITE_OTHER): Payer: Self-pay | Admitting: Internal Medicine

## 2013-07-08 VITALS — BP 130/81 | HR 91 | Temp 97.6°F | Ht 71.5 in | Wt 245.7 lb

## 2013-07-08 DIAGNOSIS — I82402 Acute embolism and thrombosis of unspecified deep veins of left lower extremity: Secondary | ICD-10-CM

## 2013-07-08 DIAGNOSIS — I2699 Other pulmonary embolism without acute cor pulmonale: Secondary | ICD-10-CM

## 2013-07-08 DIAGNOSIS — I82409 Acute embolism and thrombosis of unspecified deep veins of unspecified lower extremity: Secondary | ICD-10-CM

## 2013-07-08 DIAGNOSIS — Z7901 Long term (current) use of anticoagulants: Secondary | ICD-10-CM | POA: Insufficient documentation

## 2013-07-08 LAB — CBC
Hemoglobin: 12.1 g/dL (ref 12.0–15.0)
RBC: 4.38 MIL/uL (ref 3.87–5.11)
WBC: 6.5 10*3/uL (ref 4.0–10.5)

## 2013-07-08 LAB — BASIC METABOLIC PANEL WITH GFR
Creat: 0.69 mg/dL (ref 0.50–1.10)
GFR, Est African American: >89 mL/min
GFR, Est Non African American: >89 mL/min
Glucose, Bld: 84 mg/dL (ref 70–99)
Potassium: 4.4 mEq/L (ref 3.5–5.3)
Sodium: 134 mEq/L — ABNORMAL LOW (ref 135–145)

## 2013-07-08 LAB — POCT INR: INR: 2.2

## 2013-07-08 NOTE — Patient Instructions (Signed)
Patient instructed to take medications as defined in the Anti-coagulation Track section of this encounter.  Patient instructed to take today's dose.  Patient verbalized understanding of these instructions.  Patient instructed to CONTINUE her Lovenox injections every 12 hours until finished (has 4 remaining syringes).

## 2013-07-08 NOTE — Assessment & Plan Note (Signed)
Compliant with her Coumadin and Lovenox shots.  Patient denies any bleeding. Will do a BMP and a CBC today.To have INR checked today and follow up with Dr Alexandria Lodge. Patient will followup in one month.

## 2013-07-08 NOTE — Progress Notes (Signed)
Patient ID: Kara Mcclain, female   DOB: [birth year], 42 y.o.   MRN: [id number]    Patient: Kara Mcclain   MRN: [id number]  DOB: July 30, 1971  PCP: No Pcp Per Patient   Subjective:    CC: Follow-up   HPI: [name] is a 42 y.o. female with a PMHx of as outlined below, who presented to clinic today to establish care with a new PCP and also for HFU due to recent diagnosis of left lower extremity DVT and bilateral pulmonary embolism. This seemed unprovoked thrombosis. She was recently admitted from 07/04/2013 to 07/05/2013 and currently on Coumadin, and Lovenox shots. Patient reports compliance with her medications. Does not have any bleeding - no blood in stools, no blood in urine, no recent excessive bleeding from trauma, or easy bruising. Patient denies fevers. The patient's symptoms of difficulty in breathing have improved. The leg swelling is also gradually improving.  She denies being on any contraceptives. Patient is currently without insurance and she will see Kara Mcclain today in order to get an orange card.  Patient's family history significant for her mother , who is currently on Xarelto due to a history of DVTs and pulmonary embolism. Patient had a list of questions, which I took time to answer. I also provided information on PE and DVT. She will be seen by Dr Kara Mcclain today.   Review of Systems: Constitutional:  denies fever, chills, diaphoresis, appetite change and fatigue.  HEENT: denies photophobia, eye pain, redness, hearing loss, ear pain, congestion, sore throat, rhinorrhea, sneezing, neck pain, neck stiffness and tinnitus.  Respiratory: denies SOB, DOE, cough, chest tightness, and wheezing.  Cardiovascular: denies chest pain, palpitation. LE swelling improving.  Gastrointestinal: denies nausea, vomiting, abdominal pain, diarrhea, constipation, blood in stool.  Genitourinary: denies dysuria, urgency, frequency, hematuria, flank pain and difficulty urinating.    Musculoskeletal: denies  myalgias, back pain, joint swelling, arthralgias and gait problem.   Skin: denies pallor, rash and wound.  Neurological: denies dizziness, seizures, syncope, weakness, light-headedness, numbness and headaches.   Hematological: denies adenopathy, easy bruising, personal or family bleeding history.  Psychiatric/ Behavioral: denies suicidal ideation, mood changes, confusion, nervousness, sleep disturbance and agitation.      Current Outpatient Medications: Current Outpatient Prescriptions  Medication Sig Dispense Refill  . acetaminophen (TYLENOL) 325 MG tablet Take 650 mg by mouth every 6 (six) hours as needed for pain.      Marland Kitchen enoxaparin (LOVENOX) 120 MG/0.8ML injection Inject 0.73 mL (110 mg total) into the skin every 12 (twelve) hours.  10 Syringe  0  . HYDROcodone-acetaminophen (NORCO/VICODIN) 5-325 MG per tablet Take 1 tablet by mouth every 4 (four) hours as needed.  15 tablet  0  . warfarin (COUMADIN) 5 MG tablet Take 1 tablet (5 mg total) by mouth daily. Take two tablets (10 mg) on Saturday. Take one and a half tablets (7.5 mg) on Sunday. Do no take any additional tablets until directed by Dr. Alexandria Mcclain on Monday.  60 tablet  0   No current facility-administered medications for this visit.     No Known Allergies  Past Medical History  Diagnosis Date  . Pulmonary embolism 07/04/2013  . DVT (deep venous thrombosis) 07/04/2013    "LLE" (07/04/2013)    Past Surgical History  Procedure Laterality Date  . No past surgeries      Family History  Problem Relation Age of Onset  . Pulmonary embolism Mother   . Deep vein thrombosis Mother   . Cancer  Mother   . Hypertension Mother   . Stroke Father   . Gout Sister   . Diabetes Sister   . Hypertension Sister      Social History History   Social History  . Marital Status: Married    Spouse Name: N/A    Number of Children: N/A  . Years of Education: N/A   Social History Main Topics  . Smoking status:  Never Smoker   . Smokeless tobacco: Never Used  . Alcohol Use: 1.8 oz/week    3 Shots of liquor per week     Comment: 07/04/2013 "2-3 shots/week in mixed drinks"  . Drug Use: Yes    Special: Marijuana     Comment: 07/04/2013 "last marijuana ~ 3 wks ago"  . Sexual Activity: Yes    Birth Control/ Protection: None   Other Topics Concern  . None   Social History Narrative  . None     Objective:    Physical Exam: Filed Vitals:   07/08/13 1336  BP: 130/81  Pulse: 91  Temp: 97.6 F (36.4 C)      General: Vital signs reviewed and noted. Well-developed, well-nourished, in no acute distress; alert, appropriate and cooperative throughout examination. Mother in exam room  Head: Normocephalic, atraumatic.  Eyes: conjunctivae/corneas clear.  Ears: TM nonerythematous, not bulging, good light reflex bilaterally.  Nose: Mucous membranes moist, not inflammed, nonerythematous.  Throat: Oropharynx nonerythematous, no exudate appreciated.   Neck: No deformities, masses, or tenderness noted.  Lungs:  Normal respiratory effort. Clear to auscultation BL without crackles or wheezes.  Heart: RRR. S1 and S2 normal without gallop, rubs. No murmur.  Abdomen:  BS normoactive. Soft, Nondistended, non-tender.  No masses or organomegaly.  Extremities: No pretibial edema. Moderate left lower extremity swelling. Bilateral pulses are present.   Neurologic: A&O X3, CN II - XII are grossly intact     Assessment/ Plan:   I have discussed pt with Dr. Rogelia Boga, as detailed in my assessment, and plan and the problem based charting.

## 2013-07-08 NOTE — Progress Notes (Signed)
Anti-Coagulation Progress Note  Kara Mcclain is a 42 y.o. female who is currently on an anti-coagulation regimen.    RECENT RESULTS: Recent results are below, the most recent result is correlated with a dose of 10mg  on Saturday of discharge and 7.5mg  on Sunday and returned to [facility] today.She has been on LMWH 1mg /kg SQ q12h "bridge" therapy which she will continue for 2 additional days.  Lab Results  Component Value Date   INR 2.2 07/08/2013   INR 1.16 07/05/2013   INR 1.15 07/04/2013    ANTI-COAG DOSE: Anticoagulation Dose Instructions as of 07/08/2013     [name] Tue Wed Thu Fri Sat   New Dose 0 mg 10 mg 10 mg 7.5 mg 7.5 mg 0 mg 0 mg       ANTICOAG SUMMARY: Anticoagulation Episode Summary   Current INR goal 2.0-3.0  Next INR check 07/12/2013  INR from last check 2.2 (07/08/2013)  Weekly max dose   Target end date   INR check location [facility]  Preferred lab   Send INR reminders to    Indications  Acute DVT (deep venous thrombosis) [453.40] Acute pulmonary embolism [415.19] Long term (current) use of anticoagulants [V58.61]        Comments         ANTICOAG TODAY: Anticoagulation Summary as of 07/08/2013   INR goal 2.0-3.0  Selected INR 2.2 (07/08/2013)  Next INR check 07/12/2013  Target end date    Indications  Acute DVT (deep venous thrombosis) [453.40] Acute pulmonary embolism [415.19] Long term (current) use of anticoagulants [V58.61]      Anticoagulation Episode Summary   INR check location [facility]   Preferred lab    Send INR reminders to    Comments       PATIENT INSTRUCTIONS: Patient Instructions  Patient instructed to take medications as defined in the Anti-coagulation Track section of this encounter.  Patient instructed to take today's dose.  Patient verbalized understanding of these instructions.  Patient instructed to CONTINUE her Lovenox injections every 12 hours until finished (has 4 remaining syringes).     FOLLOW-UP Return in  4 days (on 07/12/2013) for Follow up at 9:30AM.  [name], III Pharm.D., CACP

## 2013-07-08 NOTE — Patient Instructions (Addendum)
Please continue taking your coumadin and Lovenox as directed by Dr Alexandria Lodge  Please follow up with Dr Alexandria Lodge for your INR checks and coudamin dose adjustment  Please read the information below about pulmonary embolism and given a thrombosis.  Please follow up within one month.  It was nice meeting you.     Pulmonary Embolus A pulmonary (lung) embolus (PE) is a blood clot that has traveled from another place in the body to the lung. Most clots come from deep veins in the legs or pelvis. PE is a dangerous and potentially life-threatening condition that can be treated if identified. CAUSES Blood clots form in a vein for different reasons. Usually several things cause blood clots. They include:  The flow of blood slows down.  The inside of the vein is damaged in some way.  The person has a condition that makes the blood clot more easily. These conditions may include:  Older age (especially over 17 years old).  Having a history of blood clots.  Having major or lengthy surgery. Hip surgery is particularly high-risk.  Breaking a hip or leg.  Sitting or lying still for a long time.  Cancer or cancer treatment.  Having a long, thin tube (catheter) placed inside a vein during a medical procedure.  Being overweight (obese).  Pregnancy and childbirth.  Medicines with estrogen.  Smoking.  Other circulation or heart problems. SYMPTOMS  The symptoms of a PE usually start suddenly and include:  Shortness of breath.  Coughing.  Coughing up blood or blood-tinged mucus (phlegm).  Chest pain. Pain is often worse with deep breaths.  Rapid heartbeat. DIAGNOSIS  If a PE is suspected, your caregiver will take a medical history and carry out a physical exam. Your caregiver will check for the risk factors listed above. Tests that also may be required include:  Blood tests, including studies of the clotting properties of your blood.  Imaging tests. Ultrasound, CT, MRI, and other  tests can all be used to see if you have clots in your legs or lungs. If you have a clot in your legs and have breathing or chest problems, your caregiver may conclude that you have a clot in your lungs. Further lung tests may not be needed.  Electrocardiography can look for heart strain from blood clots in the lungs. PREVENTION   Exercise the legs regularly. Take a brisk 30 minute walk every day.  Maintain a weight that is appropriate for your height.  Avoid sitting or lying in bed for long periods of time without moving your legs.  Women, particularly those over the age of 36, should consider the risks and benefits of taking estrogen medicines, including birth control pills.  Do not smoke, especially if you take estrogen medicines.  Long-distance travel can increase your risk. You should exercise your legs by walking or pumping the muscles every hour.  In hospital prevention:  Your caregiver will assess your need for preventive PE care (prophylaxis) when you are admitted to the hospital. If you are having surgery, your surgeon will assess you the day of or day after surgery.  Prevention may include medical and nonmedical measures. TREATMENT   The most common treatment for a PE is blood thinning (anticoagulant) medicine, which reduces the blood's tendency to clot. Anticoagulants can stop new blood clots from forming and old ones from growing. They cannot dissolve existing clots. Your body does this by itself over time. Anticoagulants can be given by mouth, by intravenous (IV) access, or  by injection. Your caregiver will determine the best program for you.  Less commonly, clot-dissolving drugs (thrombolytics) are used to dissolve a PE. They carry a high risk of bleeding, so they are used mainly in severe cases.  Very rarely, a blood clot in the leg needs to be removed surgically.  If you are unable to take anticoagulants, your caregiver may arrange for you to have a filter placed in a  main vein in your abdomen. This filter prevents clots from traveling to your lungs. HOME CARE INSTRUCTIONS   Take all medicines prescribed by your caregiver. Follow the directions carefully.  Warfarin. Most people will continue taking warfarin after hospital discharge. Your caregiver will advise you on the length of treatment (usually 3 6 months, sometimes lifelong).  Too much and too little warfarin are both dangerous. Too much warfarin increases the risk of bleeding. Too little warfarin continues to allow the risk for blood clots. While taking warfarin, you will need to have regular blood tests to measure your blood clotting time. These blood tests usually include both the prothrombin time (PT) and International Normalized Ratio (INR) tests. The PT and INR results allow your caregiver to adjust your dose of warfarin. The dose can change for many reasons. It is critically important that you take warfarin exactly as prescribed, and that you have your PT and INR levels drawn exactly as directed.  Many foods, especially foods high in vitamin K can interfere with warfarin and affect the PT and INR results. Foods high in vitamin K include spinach, kale, broccoli, cabbage, collard and turnip greens, brussels sprouts, peas, cauliflower, seaweed, and parsley as well as beef and pork liver, green tea, and soybean oil. You should eat a consistent amount of foods high in vitamin K. Avoid major changes in your diet, or notify your caregiver before changing your diet. Arrange a visit with a dietitian to answer your questions.  Many medicines can interfere with warfarin and affect the PT and INR results. You must tell your caregiver about any and all medicines you take, this includes all vitamins and supplements. Be especially cautious with aspirin and anti-inflammatory medicines. Ask your caregiver before taking these. Do not take or discontinue any prescribed or over-the-counter medicine except on the advice of your  caregiver or pharmacist.  Warfarin can have side effects, such as excessive bruising or bleeding. You will need to hold pressure over cuts for longer than usual.  Alcohol can change the body's ability to handle warfarin. It is best to avoid alcoholic drinks or consume only very small amounts while taking warfarin. Notify your caregiver if you change your alcohol intake.  Notify your dentist or other caregivers before procedures.  Avoid contact sports.  Wear a medical alert bracelet or carry a medical alert card.  Ask your caregiver how soon you can go back to normal activities. Not being active can lead to new clots. Ask for a list of what you should and should not do.  Compression stockings. These are tight elastic stockings that apply pressure to the lower legs. This can help keep the blood in the legs from clotting. You may need to wear compressions stockings at home to help prevent clots.  Smoking. If you smoke, quit. Ask your caregiver for help with quitting smoking.  Learn as much as you can about PE. Educating yourself can help prevent PE from reoccurring. SEEK MEDICAL CARE IF:   You notice a rapid heartbeat.  You feel weaker or more tired than usual.  You feel faint.  You notice increased bruising.  Your symptoms are not getting better in the time expected.  You are having side effects of medicine. SEEK IMMEDIATE MEDICAL CARE IF:   You have chest pain.  You have trouble breathing.  You have new or increased swelling or pain in one leg.  You cough up blood.  You notice blood in vomit, in a bowel movement, or in urine.  You have an oral temperature above 102 F (38.9 C), not controlled by medicine. You may have another PE. A blood clot in the lungs is a medical emergency. Call your local emergency services (911 in [place] to get to the nearest hospital or clinic. Do not drive yourself. MAKE SURE YOU:   Understand these instructions.  Will watch your  condition.  Will get help right away if you are not doing well or get worse. Document Released: 11/04/2000 Document Revised: 05/08/2012 Document Reviewed: 05/11/2009 [facility] Patient Information 2014 [place], [place].    Venous Thromboembolism, Prevention A venous thromboembolism is a blood clot that forms in a vein. A blood clot in a deep vein is called a deep venous thrombosis (DVT). A blood clot in the lungs is called a pulmonary embolism (PE). Blood clots are dangerous and can cause death. Blood clots can form in the:  Lungs.  Legs.  Arms. CAUSES  A blood clot can form in a vein from different conditions. A blood clot can develop due to:  Blood flow within a vein that is sluggish or very slow.  Medical conditions that make the blood clot easily.  Vein damage. RISK FACTORS Risk factors can increase your risk of developing a blood clot. Risk factors can include:  Smoking.  Obesity.  Age.  Immobility or sedentary lifestyle.  Sitting or standing for long periods of time.  Chronic or long-term bedrest.  Medical or past history of blood clots.  Family history of blood clots.  Hip, leg, or pelvis injury or trauma.  Major surgery, especially surgery on the hip, knee, or abdomen.  Pregnancy and childbirth.  Birth control pills and hormone replacement therapy.  Medical conditions such as  Peripheral vascular disease (PVD).  Diabetes.  Cancer. SYMPTOMS  Symptoms of VTE can depend on where the clot is located and if the clot breaks off and travels to another organ. Sometimes, there may be no symptoms.   DVT symptoms can include:  Swelling of the leg or arm, especially on one side.  Warmth and redness of the leg or arm, especially on one side.  Pain in an arm or leg. Leg pain may be more noticeable or worse when standing or walking.  PE symptoms can include:  Shortness of breath.  Coughing.  Coughing up blood or blood-tinged mucus  (hemoptysis).  Chest pain or chest pain with deep breaths (pleuritic chest pain).  Apprehension, anxiety, or a feeling of impending doom.  Rapid heartbeat. PREVENTION  Exercise regularly. Take a brisk 30 minute walk every day. Staying active and moving around can help prevent blood clots.  Avoid sitting or lying in bed for long periods of time. Change your position often, especially during a long trip.  Women, especially those over the age of 64, should consider the risks and benefits of taking estrogen medicines. This includes birth control pills and hormone replacement therapy.  Do not smoke, especially if you take estrogen medicines. If you smoke, talk to your caregiver on how to quit.  Eat plenty of fruits and vegetables. Ask your  caregiver or dietitian if there are foods you should avoid.  Maintain a weight as suggested by your caregiver.  Wear loose-fitting clothing. Avoid constrictive or tight clothing around your legs or waist.  Try not to bump or injure your legs. Avoid crossing your legs when you are sitting.  Do not use pillows under your knees unless told by your caregiver.  Take all medicines that your caregiver prescribes you.  Wear special stockings (compression stockings or TED hose) if your caregiver prescribes them.  Wearing compression stockings (support hose) can make the leg veins more narrow. This increases blood flow in the legs and can help prevent blood clots.  It is important to wear compression stockings correctly. Do not let them bunch up when you are wearing them. TRAVEL Long distance travel can increase the risk of a blood clot. To prevent a blood clot when traveling:  You should exercise your legs by walking or by pumping your muscles every hour. To help prevent poor circulation on long trips, stand, stretch, and walk up and down the aisle of your airplane, train, or bus as often as possible to get the blood moving.  Do squats if you are able. If  you are unable to do squats, raise your foot on the balls of your feet and tighten your lower leg muscles (particularly the calve muscles) while seated. Pointing (flexing and extending) your toes while tightening your calves while seated are also good exercises to do every hour during long trips. They help increase blood flow and reduce risk of DVT.  Stay well hydrated. Drink water regularly when traveling, especially when you are sitting or immobile for long periods of time.  Use of drugs to prevent DVT during routine travel is not generally recommended. Before taking any drugs to reduce risk of DVT, consult your caregiver. SURGERY AND HOSPITALIZATION  People who are at high risk for a blood clot may be given a blood thinning medicine (anticoagulant) when they are hospitalized even if they are not going to have surgery.  A long trip prior to surgery can increase the risk of a clot for patients undergoing hip and knee replacements. Talk to your caregiver about travel plans before your surgery.  After hip or knee surgery, your caregiver may give you anticoagulants to help prevent blood clots.  Anticoagulants may be given to people at high risk of developing thromboembolism, before, during, or sometimes after surgery, including people with clotting disorders or with a history of past thromboembolism. TRAVEL AFTER SURGERY  In orthopedic surgery, the cutting of bones prompts the body to increase clotting factors in the blood. Due to the size of the bones involved in hip and knee replacements, there is a higher risk of blood clotting than other orthopedic surgeries.  There is a risk of clotting for up to 4 6 weeks after surgery. Flying or traveling long distances can increase your risk of a clot. As a result, those who travel long distances may need additional preventive measures after their procedure.  Drink only non-alcoholic beverages during your flight, train, or car travel. Alcohol can dehydrate  you and increase your risk of getting blood clots. SEEK IMMEDIATE MEDICAL CARE IF:   You develop chest pain.  You develop severe shortness of breath.  You have breathing problems after traveling.  You develop swelling or pain in the leg.  You begin to cough up bloody mucus or phlegm (sputum).  You feel dizzy or faint. Document Released: 10/26/2009 Document Revised: 08/01/2012 Document  Reviewed: 10/26/2009 [employer] Patient Information 2014 [place], [place].

## 2013-07-08 NOTE — Assessment & Plan Note (Signed)
As above under DVT.  Ordered BMET and CBC

## 2013-07-09 ENCOUNTER — Other Ambulatory Visit: Payer: Self-pay | Admitting: *Deleted

## 2013-07-09 MED ORDER — HYDROCODONE-ACETAMINOPHEN 5-325 MG PO TABS: 1.0000 | ORAL_TABLET | Freq: Four times a day (QID) | ORAL | Status: DC | PRN

## 2013-07-09 NOTE — Telephone Encounter (Signed)
Call from pt - stated she forgot to ask about something for pain yesterday ; continues to c/o LLE pain.

## 2013-07-09 NOTE — Discharge Summary (Signed)
  Date: 07/09/2013  Patient name: Kara Mcclain  Medical record number: [id number]  Date of birth: 02/19/1971   This patient has been discussed with the house staff. Please see their note for complete details. I concur with their findings and plan.  Jonah Blue, DO 07/09/2013, 2:45 PM

## 2013-07-09 NOTE — Telephone Encounter (Signed)
Hydrocodone 5/325mg  rx called to [facility] pharmacy; pt also made awared.

## 2013-07-09 NOTE — Progress Notes (Signed)
Case discussed with Dr. Kazibwe soon after the resident saw the patient.  We reviewed the resident's history and exam and pertinent patient test results.  I agree with the assessment, diagnosis, and plan of care documented in the resident's note. 

## 2013-07-12 ENCOUNTER — Ambulatory Visit: Payer: Self-pay

## 2013-07-12 ENCOUNTER — Ambulatory Visit (INDEPENDENT_AMBULATORY_CARE_PROVIDER_SITE_OTHER): Payer: Self-pay | Admitting: Pharmacist

## 2013-07-12 DIAGNOSIS — I82402 Acute embolism and thrombosis of unspecified deep veins of left lower extremity: Secondary | ICD-10-CM

## 2013-07-12 DIAGNOSIS — I82409 Acute embolism and thrombosis of unspecified deep veins of unspecified lower extremity: Secondary | ICD-10-CM

## 2013-07-12 DIAGNOSIS — Z7901 Long term (current) use of anticoagulants: Secondary | ICD-10-CM

## 2013-07-12 DIAGNOSIS — I2699 Other pulmonary embolism without acute cor pulmonale: Secondary | ICD-10-CM

## 2013-07-12 LAB — POCT INR: INR: 3.2

## 2013-07-12 NOTE — Patient Instructions (Signed)
Patient instructed to take medications as defined in the Anti-coagulation Track section of this encounter.  Patient instructed to take today's dose.  Patient verbalized understanding of these instructions.    

## 2013-07-12 NOTE — Progress Notes (Signed)
Anti-Coagulation Progress Note  Kara Mcclain is a 42 y.o. female who is currently on an anti-coagulation regimen.    RECENT RESULTS: Recent results are below, the most recent result is correlated with a dose of 35mg  over 4 days for average weighted daily dose of 8.75mg . Will DECREASE to 7.5mg  daily until INR on  Monday. Lab Results  Component Value Date   INR 3.2 07/12/2013   INR 2.2 07/08/2013   INR 1.16 07/05/2013    ANTI-COAG DOSE: Anticoagulation Dose Instructions as of 07/12/2013     [name] Tue Wed Thu Fri Sat   New Dose 7.5 mg 10 mg 10 mg 7.5 mg 7.5 mg 7.5 mg 7.5 mg       ANTICOAG SUMMARY: Anticoagulation Episode Summary   Current INR goal 2.0-3.0  Next INR check 07/15/2013  INR from last check 3.2! (07/12/2013)  Weekly max dose   Target end date   [facility] check [facility]  Preferred lab   Send INR reminders to    Indications  Acute DVT (deep venous thrombosis) [453.40] Acute pulmonary embolism [415.19] Long term (current) use of anticoagulants [V58.61]        Comments         ANTICOAG TODAY: Anticoagulation Summary as of 07/12/2013   INR goal 2.0-3.0  Selected INR 3.2! (07/12/2013)  Next INR check 07/15/2013  Target end date    Indications  Acute DVT (deep venous thrombosis) [453.40] Acute pulmonary embolism [415.19] Long term (current) use of anticoagulants [V58.61]      Anticoagulation Episode Summary   [facility] check [facility]   Preferred lab    Send INR reminders to    Comments       PATIENT INSTRUCTIONS: Patient Instructions  Patient instructed to take medications as defined in the Anti-coagulation Track section of this encounter.  Patient instructed to take today's dose.  Patient verbalized understanding of these instructions.       FOLLOW-UP Return in 3 days (on 07/15/2013) for Follow up INR at 0945h.  [name], III Pharm.D., CACP

## 2013-07-13 NOTE — ED Provider Notes (Signed)
CSN: [id number]     Arrival date & time 07/04/13  5621 History     First MD Initiated Contact with Patient 07/04/13 [phone]     Chief Complaint  Patient presents with  . DVT   (Consider location/radiation/quality/duration/timing/severity/associated sxs/prior Treatment) HPI Patient was sent in with an outpatient Doppler showing a DVT in her left leg. She has had swelling in her leg. She saw a chiropractor that did not help. She was instructed to come to the ED for further workup of her DVT. She's had increasing dyspnea over the last month also. She states she cannot walk as much as she used to. No fevers. No cough. No hemoptysis. She has had family members with DVTs.   Past Medical History  Diagnosis Date  . Pulmonary embolism 07/04/2013  . DVT (deep venous thrombosis) 07/04/2013    "LLE" (07/04/2013)   Past Surgical History  Procedure Laterality Date  . No past surgeries     Family History  Problem Relation Age of Onset  . Pulmonary embolism Mother   . Deep vein thrombosis Mother   . Cancer Mother   . Hypertension Mother   . Stroke Father   . Gout Sister   . Diabetes Sister   . Hypertension Sister    History  Substance Use Topics  . Smoking status: Never Smoker   . Smokeless tobacco: Never Used  . Alcohol Use: 1.8 oz/week    3 Shots of liquor per week     Comment: 07/04/2013 "2-3 shots/week in mixed drinks"   OB History   Grav Para Term Preterm Abortions TAB SAB Ect Mult Living                 Review of Systems  Constitutional: Negative for activity change and appetite change.  HENT: Negative for neck stiffness.   Eyes: Negative for pain.  Respiratory: Positive for shortness of breath. Negative for chest tightness.   Cardiovascular: Positive for leg swelling. Negative for chest pain.  Gastrointestinal: Negative for nausea, vomiting, abdominal pain and diarrhea.  Genitourinary: Negative for flank pain.  Musculoskeletal: Negative for back pain.  Skin: Negative for  rash.  Neurological: Negative for weakness, numbness and headaches.  Psychiatric/Behavioral: Negative for behavioral problems.    Allergies  Review of patient's allergies indicates no known allergies.  Home Medications   Current Outpatient Rx  Name  Route  Sig  Dispense  Refill  . acetaminophen (TYLENOL) 325 MG tablet   Oral   Take 650 mg by mouth every 6 (six) hours as needed for pain.         Marland Kitchen enoxaparin (LOVENOX) 120 MG/0.8ML injection   Subcutaneous   Inject 0.73 mL (110 mg total) into the skin every 12 (twelve) hours.   10 Syringe   0   . HYDROcodone-acetaminophen (NORCO/VICODIN) 5-325 MG per tablet   Oral   Take 1 tablet by mouth every 6 (six) hours as needed.   120 tablet   0   . warfarin (COUMADIN) 5 MG tablet   Oral   Take 1 tablet (5 mg total) by mouth daily. Take two tablets (10 mg) on Saturday. Take one and a half tablets (7.5 mg) on Sunday. Do no take any additional tablets until directed by Dr. Alexandria Lodge on Monday.   60 tablet   0    BP 123/79  Pulse 80  Temp(Src) 98.3 F (36.8 C) (Oral)  Resp 18  Ht 5' 11.5" (1.816 m)  Wt 245 lb 13 oz (111.5  kg)  BMI 33.81 kg/m2  SpO2 100%  LMP 06/12/2013 Physical Exam  Nursing note and vitals reviewed. Constitutional: She is oriented to person, place, and time. She appears well-developed and well-nourished.  HENT:  Head: Normocephalic and atraumatic.  Eyes: EOM are normal. Pupils are equal, round, and reactive to light.  Neck: Normal range of motion. Neck supple.  Cardiovascular: Normal rate, regular rhythm and normal heart sounds.   No murmur heard. Pulmonary/Chest: Effort normal and breath sounds normal. No respiratory distress. She has no wheezes. She has no rales.  Abdominal: Soft. Bowel sounds are normal. She exhibits no distension. There is no tenderness. There is no rebound and no guarding.  Musculoskeletal: Normal range of motion. She exhibits edema and tenderness.  Mild edema to left lower leg   Neurological: She is alert and oriented to person, place, and time. No cranial nerve deficit.  Skin: Skin is warm and dry.  Psychiatric: She has a normal mood and affect. Her speech is normal.    ED Course   Procedures (including critical care time)  Labs Reviewed  LUPUS ANTICOAGULANT PANEL - Abnormal; Notable for the following:    PTT Lupus Anticoagulant 45.4 (*)    All other components within normal limits  CARDIOLIPIN ANTIBODIES, IGG, IGM, IGA - Abnormal; Notable for the following:    Anticardiolipin IgG 5 (*)    Anticardiolipin IgM 0 (*)    Anticardiolipin IgA 1 (*)    All other components within normal limits  CBC - Abnormal; Notable for the following:    RBC 3.77 (*)    Hemoglobin 10.8 (*)    HCT 31.6 (*)    All other components within normal limits  URINE RAPID DRUG SCREEN (HOSP PERFORMED) - Abnormal; Notable for the following:    Opiates POSITIVE (*)    Tetrahydrocannabinol POSITIVE (*)    All other components within normal limits  ANTITHROMBIN III  PROTEIN C ACTIVITY  PROTEIN C, TOTAL  PROTEIN S ACTIVITY  PROTEIN S, TOTAL  BETA-2-GLYCOPROTEIN I ABS, IGG/M/A  HOMOCYSTEINE  FACTOR 5 LEIDEN  PROTHROMBIN GENE MUTATION  PROTIME-INR  BASIC METABOLIC PANEL  PROTIME-INR  POCT PREGNANCY, URINE   No results found. 1. Pulmonary embolism   2. DVT (deep venous thrombosis), left   3. Acute pulmonary embolism     MDM  Patient with DVT and large pulmonary embolisms. Admitted to the hospital.  Juliet Rude. Rubin Payor, MD 07/13/13 [phone]

## 2013-07-15 ENCOUNTER — Ambulatory Visit (INDEPENDENT_AMBULATORY_CARE_PROVIDER_SITE_OTHER): Payer: No Typology Code available for payment source | Admitting: Pharmacist

## 2013-07-15 ENCOUNTER — Ambulatory Visit: Payer: Self-pay

## 2013-07-15 DIAGNOSIS — I82402 Acute embolism and thrombosis of unspecified deep veins of left lower extremity: Secondary | ICD-10-CM

## 2013-07-15 DIAGNOSIS — I2699 Other pulmonary embolism without acute cor pulmonale: Secondary | ICD-10-CM

## 2013-07-15 DIAGNOSIS — I82409 Acute embolism and thrombosis of unspecified deep veins of unspecified lower extremity: Secondary | ICD-10-CM

## 2013-07-15 DIAGNOSIS — Z7901 Long term (current) use of anticoagulants: Secondary | ICD-10-CM

## 2013-07-15 LAB — POCT INR: INR: 2.7

## 2013-07-15 MED ORDER — WARFARIN SODIUM 5 MG PO TABS: ORAL_TABLET | ORAL | Status: DC

## 2013-07-15 NOTE — Progress Notes (Signed)
Anti-Coagulation Progress Note  Kara Mcclain is a 42 y.o. female who is currently on an anti-coagulation regimen.    RECENT RESULTS: Recent results are below, the most recent result is correlated with a dose of 57.5 mg. per week: Lab Results  Component Value Date   INR 2.70 07/15/2013   INR 3.2 07/12/2013   INR 2.2 07/08/2013    ANTI-COAG DOSE: Anticoagulation Dose Instructions as of 07/15/2013     [name] Tue Wed Thu Fri Sat   New Dose 7.5 mg 7.5 mg 10 mg 7.5 mg 7.5 mg 10 mg 7.5 mg       ANTICOAG SUMMARY: Anticoagulation Episode Summary   Current INR goal 2.0-3.0  Next INR check 07/23/2013  INR from last check 2.70 (07/15/2013)  Weekly max dose   Target end date   [facility]  Preferred lab   Send INR reminders to    Indications  Acute DVT (deep venous thrombosis) [453.40] Acute pulmonary embolism [415.19] Long term (current) use of anticoagulants [V58.61]        Comments         ANTICOAG TODAY: Anticoagulation Summary as of 07/15/2013   INR goal 2.0-3.0  Selected INR 2.70 (07/15/2013)  Next INR check 07/23/2013  Target end date    Indications  Acute DVT (deep venous thrombosis) [453.40] Acute pulmonary embolism [415.19] Long term (current) use of anticoagulants [V58.61]      Anticoagulation Episode Summary   [facility]   Preferred lab    Send INR reminders to    Comments       PATIENT INSTRUCTIONS: Patient Instructions  Patient instructed to take medications as defined in the Anti-coagulation Track section of this encounter.  Patient instructed to take today's dose.  Patient verbalized understanding of these instructions.       FOLLOW-UP Return in 8 days (on 07/23/2013) for Follow up INR at 1000h.  [name], III Pharm.D., CACP

## 2013-07-15 NOTE — ED Provider Notes (Signed)
Medical screening examination/treatment/procedure(s) were performed by non-physician practitioner and as supervising physician I was immediately available for consultation/collaboration.  Corinda Ammon M Chloie Loney, MD 07/15/13 2053 

## 2013-07-15 NOTE — Patient Instructions (Signed)
Patient instructed to take medications as defined in the Anti-coagulation Track section of this encounter.  Patient instructed to take today's dose.  Patient verbalized understanding of these instructions.    

## 2013-07-16 ENCOUNTER — Encounter: Payer: Self-pay | Admitting: Internal Medicine

## 2013-07-16 ENCOUNTER — Telehealth: Payer: Self-pay | Admitting: *Deleted

## 2013-07-16 ENCOUNTER — Ambulatory Visit (INDEPENDENT_AMBULATORY_CARE_PROVIDER_SITE_OTHER): Payer: No Typology Code available for payment source | Admitting: Internal Medicine

## 2013-07-16 ENCOUNTER — Ambulatory Visit (HOSPITAL_COMMUNITY)
Admission: RE | Admit: 2013-07-16 | Discharge: 2013-07-16 | Disposition: A | Discharge status: Home / Self Care | Payer: No Typology Code available for payment source | Source: Ambulatory Visit | Attending: Internal Medicine | Admitting: Internal Medicine

## 2013-07-16 VITALS — BP 133/82 | HR 90 | Temp 98.9°F | Ht 71.5 in | Wt 246.5 lb

## 2013-07-16 DIAGNOSIS — R079 Chest pain, unspecified: Secondary | ICD-10-CM | POA: Insufficient documentation

## 2013-07-16 DIAGNOSIS — I82409 Acute embolism and thrombosis of unspecified deep veins of unspecified lower extremity: Secondary | ICD-10-CM

## 2013-07-16 NOTE — Assessment & Plan Note (Signed)
Intermittent episodes of brief 1-5 seconds of chest discomfort on left upper side x2 days.  Started after Dr. Alexandria Lodge visit yesterday.  Associated with SOB but she says that was when she was anxious about the pain which then quickly resolves. Currently no chest pain or SOB. Likely secondary to recent PE diagnosis. On coumadin with therapeutic INR. EKG done today shows minor change in t wave axis with flattening in lateral leads compared to before.   -continue coumadin and monitor for now -counseled extensively on warning symptoms including worsening of pain, radiation to arms, diaphoresis, SOB, fever, chills, syncope. She will need to go to ED or call and come to clinic right away.  She is able to repeat symptoms of concern to me and voices understanding.

## 2013-07-16 NOTE — Telephone Encounter (Signed)
Pt called with c/o sharp quick pulling sensation to left side of chest, lasting about 5 seconds. Pain 3/10 but goes right away.   Onset yesterday, has had 5 pulling feelings since then.  This is getting her nervous and then she gets SOB after thinking about it. She feels normal at all other times. No pain to leg,   Hx: Acute  PE and DVT Pt on coumadin, last INR 2.7 on 8/25 Pt # [phone]  Pt called earlier today and was told to go to [facility] but she called there and was told there was a 2 hour wait.  She wants to come to clinic. Appointment given today at 1:45

## 2013-07-16 NOTE — Patient Instructions (Signed)
Please continue current treatment with warfarin and elevate your legs when sitting down or lying down.  If your chest pain returns or worsens or you notice shortness of breath, sweating, radiation of pain to your arms or back, numbness, tingling, nausea, vomiting, fever, chills, or abdominal pain, call us right away or go to ED.  Pulmonary Embolus A pulmonary (lung) embolus (PE) is a blood clot that has traveled from another place in the body to the lung. Most clots come from deep veins in the legs or pelvis. PE is a dangerous and potentially life-threatening condition that can be treated if identified. CAUSES Blood clots form in a vein for different reasons. Usually several things cause blood clots. They include:  The flow of blood slows down.  The inside of the vein is damaged in some way.  The person has a condition that makes the blood clot more easily. These conditions may include:  Older age (especially over 52 years old).  Having a history of blood clots.  Having major or lengthy surgery. Hip surgery is particularly high-risk.  Breaking a hip or leg.  Sitting or lying still for a long time.  Cancer or cancer treatment.  Having a long, thin tube (catheter) placed inside a vein during a medical procedure.  Being overweight (obese).  Pregnancy and childbirth.  Medicines with estrogen.  Smoking.  Other circulation or heart problems. SYMPTOMS  The symptoms of a PE usually start suddenly and include:  Shortness of breath.  Coughing.  Coughing up blood or blood-tinged mucus (phlegm).  Chest pain. Pain is often worse with deep breaths.  Rapid heartbeat. DIAGNOSIS  If a PE is suspected, your caregiver will take a medical history and carry out a physical exam. Your caregiver will check for the risk factors listed above. Tests that also may be required include:  Blood tests, including studies of the clotting properties of your blood.  Imaging tests. Ultrasound, CT,  MRI, and other tests can all be used to see if you have clots in your legs or lungs. If you have a clot in your legs and have breathing or chest problems, your caregiver may conclude that you have a clot in your lungs. Further lung tests may not be needed.  Electrocardiography can look for heart strain from blood clots in the lungs. PREVENTION   Exercise the legs regularly. Take a brisk 30 minute walk every day.  Maintain a weight that is appropriate for your height.  Avoid sitting or lying in bed for long periods of time without moving your legs.  Women, particularly those over the age of 38, should consider the risks and benefits of taking estrogen medicines, including birth control pills.  Do not smoke, especially if you take estrogen medicines.  Long-distance travel can increase your risk. You should exercise your legs by walking or pumping the muscles every hour.  In hospital prevention:  Your caregiver will assess your need for preventive PE care (prophylaxis) when you are admitted to the hospital. If you are having surgery, your surgeon will assess you the day of or day after surgery.  Prevention may include medical and nonmedical measures. TREATMENT   The most common treatment for a PE is blood thinning (anticoagulant) medicine, which reduces the blood's tendency to clot. Anticoagulants can stop new blood clots from forming and old ones from growing. They cannot dissolve existing clots. Your body does this by itself over time. Anticoagulants can be given by mouth, by intravenous (IV) access, or by  injection. Your caregiver will determine the best program for you.  Less commonly, clot-dissolving drugs (thrombolytics) are used to dissolve a PE. They carry a high risk of bleeding, so they are used mainly in severe cases.  Very rarely, a blood clot in the leg needs to be removed surgically.  If you are unable to take anticoagulants, your caregiver may arrange for you to have a  filter placed in a main vein in your abdomen. This filter prevents clots from traveling to your lungs. HOME CARE INSTRUCTIONS   Take all medicines prescribed by your caregiver. Follow the directions carefully.  Warfarin. Most people will continue taking warfarin after hospital discharge. Your caregiver will advise you on the length of treatment (usually 3 6 months, sometimes lifelong).  Too much and too little warfarin are both dangerous. Too much warfarin increases the risk of bleeding. Too little warfarin continues to allow the risk for blood clots. While taking warfarin, you will need to have regular blood tests to measure your blood clotting time. These blood tests usually include both the prothrombin time (PT) and International Normalized Ratio (INR) tests. The PT and INR results allow your caregiver to adjust your dose of warfarin. The dose can change for many reasons. It is critically important that you take warfarin exactly as prescribed, and that you have your PT and INR levels drawn exactly as directed.  Many foods, especially foods high in vitamin K can interfere with warfarin and affect the PT and INR results. Foods high in vitamin K include spinach, kale, broccoli, cabbage, collard and turnip greens, brussels sprouts, peas, cauliflower, seaweed, and parsley as well as beef and pork liver, green tea, and soybean oil. You should eat a consistent amount of foods high in vitamin K. Avoid major changes in your diet, or notify your caregiver before changing your diet. Arrange a visit with a dietitian to answer your questions.  Many medicines can interfere with warfarin and affect the PT and INR results. You must tell your caregiver about any and all medicines you take, this includes all vitamins and supplements. Be especially cautious with aspirin and anti-inflammatory medicines. Ask your caregiver before taking these. Do not take or discontinue any prescribed or over-the-counter medicine except on  the advice of your caregiver or pharmacist.  Warfarin can have side effects, such as excessive bruising or bleeding. You will need to hold pressure over cuts for longer than usual.  Alcohol can change the body's ability to handle warfarin. It is best to avoid alcoholic drinks or consume only very small amounts while taking warfarin. Notify your caregiver if you change your alcohol intake.  Notify your dentist or other caregivers before procedures.  Avoid contact sports.  Wear a medical alert bracelet or carry a medical alert card.  Ask your caregiver how soon you can go back to normal activities. Not being active can lead to new clots. Ask for a list of what you should and should not do.  Compression stockings. These are tight elastic stockings that apply pressure to the lower legs. This can help keep the blood in the legs from clotting. You may need to wear compressions stockings at home to help prevent clots.  Smoking. If you smoke, quit. Ask your caregiver for help with quitting smoking.  Learn as much as you can about PE. Educating yourself can help prevent PE from reoccurring. SEEK MEDICAL CARE IF:   You notice a rapid heartbeat.  You feel weaker or more tired than usual.  You feel faint.  You notice increased bruising.  Your symptoms are not getting better in the time expected.  You are having side effects of medicine. SEEK IMMEDIATE MEDICAL CARE IF:   You have chest pain.  You have trouble breathing.  You have new or increased swelling or pain in one leg.  You cough up blood.  You notice blood in vomit, in a bowel movement, or in urine.  You have an oral temperature above 102 F (38.9 C), not controlled by medicine. You may have another PE. A blood clot in the lungs is a medical emergency. Call your local emergency services (911 in [place] to get to the nearest hospital or clinic. Do not drive yourself. MAKE SURE YOU:   Understand these instructions.  Will  watch your condition.  Will get help right away if you are not doing well or get worse. Document Released: 11/04/2000 Document Revised: 05/08/2012 Document Reviewed: 05/11/2009 [facility] Patient Information 2014 [place], [place].

## 2013-07-16 NOTE — Telephone Encounter (Signed)
closed

## 2013-07-16 NOTE — Telephone Encounter (Signed)
OK 

## 2013-07-16 NOTE — Progress Notes (Signed)
Subjective:   Patient ID: [name] female   DOB: 1971-10-07 42 y.o.   MRN: [id number]  HPI: [name] is a 42 y.o. female with PMH of DVT and PE 07/04/2013 now on coumadin and follows with Dr. Michaell Mcclain presenting to [facility] for acute visit.  She reports having sharp quick pulling sensations on the left side of her chest since yesterday that last 1-5 seconds and spontaneously resolves. The pain in non-radiating, not-reproducible, no alleviating or exacerbating factors, and non-tender to palpation of left chest.  She was last seen by Dr. Alexandria Mcclain yesterday for INR check where her INR was 2.70.  She is compliant with her coumadin.  She does report increase stress lately with recent separation from her husband.  She also endorses shortness of breath when she got anxious about her chest pain but then she calms herself down and notices improvement.  She was taking vicodin for her LLE swelling and pain that has since then improved and she has not had to take her medication for pain for the past 3 days.  She currently denies any fever, chills, chest pain, sob, diaphoresis, nausea, vomiting, diarrhea, abdominal pain, dysuria, or any urinary complaints.   Past Medical History  Diagnosis Date  . Pulmonary embolism 07/04/2013  . DVT (deep venous thrombosis) 07/04/2013    "LLE" (07/04/2013)   Current Outpatient Prescriptions  Medication Sig Dispense Refill  . acetaminophen (TYLENOL) 325 MG tablet Take 650 mg by mouth every 6 (six) hours as needed for pain.      Marland Kitchen warfarin (COUMADIN) 5 MG tablet Take 1&1/2 tablets every day EXCEPT on Tuesdays/Fridays--take 2 tablets.  72 tablet  2   No current facility-administered medications for this visit.   Family History  Problem Relation Age of Onset  . Pulmonary embolism Mother   . Deep vein thrombosis Mother   . Cancer Mother   . Hypertension Mother   . Stroke Father   . Gout Sister   . Diabetes Sister   . Hypertension Sister    History   Social  History  . Marital Status: Married    Spouse Name: N/A    Number of Children: N/A  . Years of Education: N/A   Social History Main Topics  . Smoking status: Never Smoker   . Smokeless tobacco: Never Used  . Alcohol Use: 1.8 oz/week    3 Shots of liquor per week     Comment: sometimes  . Drug Use: No     Comment: 07/04/2013 "last marijuana ~ 3 wks ago"  . Sexual Activity: None   Other Topics Concern  . None   Social History Narrative  . None   Review of Systems:  Constitutional:  Denies fever, chills, diaphoresis, appetite change and fatigue.   HEENT:  Denies congestion, sore throat, rhinorrhea, sneezing, mouth sores, trouble swallowing, neck pain   Respiratory:  Occasional SOB and cough. Denies DOE and wheezing.   Cardiovascular:  Intermittent brief chest pain, leg swelling  Gastrointestinal:  Denies nausea, vomiting, abdominal pain, diarrhea, constipation, blood in stool and abdominal distention.   Genitourinary:  Denies dysuria, urgency, frequency, hematuria, flank pain and difficulty urinating.   Musculoskeletal:  LLE swelling.  Denies myalgias, back pain, arthralgias and gait problem.   Skin:  Denies pallor, rash and wound.   Neurological:  Anxious at times. Denies dizziness, seizures, syncope, weakness, light-headedness, numbness and headaches.    Objective:  Physical Exam: Filed Vitals:   07/16/13 1405  BP: 133/82  Pulse: 90  Temp: 98.9 F (37.2 C)  TempSrc: Oral  Height: 5' 11.5" (1.816 m)  Weight: 246 lb 8 oz (111.812 kg)  SpO2: 100%   Vitals reviewed. General: sitting in chair, NAD HEENT: PERRL, EOMI, no scleral icterus Cardiac: RRR, no rubs, murmurs or gallops Breast exam: non-tender to palpation, no discrete masses noted on palpation, no discharge Pulm: clear to auscultation bilaterally, no wheezes, rales, or rhonchi Abd: soft, nontender, nondistended, BS present Ext: warm and well perfused, no pedal edema, +2DP B/L Neuro: alert and oriented X3, cranial  nerves II-XII grossly intact, strength and sensation to light touch equal in bilateral upper and lower extremities  Assessment & Plan:  Discussed with Dr. Aundria Mcclain

## 2013-07-17 NOTE — Progress Notes (Signed)
Case discussed with Dr. Qureshi soon after the resident saw the patient.  We reviewed the resident's history and exam and pertinent patient test results.  I agree with the assessment, diagnosis, and plan of care documented in the resident's note. 

## 2013-07-23 ENCOUNTER — Ambulatory Visit (INDEPENDENT_AMBULATORY_CARE_PROVIDER_SITE_OTHER): Payer: No Typology Code available for payment source | Admitting: Pharmacist

## 2013-07-23 DIAGNOSIS — Z7901 Long term (current) use of anticoagulants: Secondary | ICD-10-CM

## 2013-07-23 DIAGNOSIS — I82402 Acute embolism and thrombosis of unspecified deep veins of left lower extremity: Secondary | ICD-10-CM

## 2013-07-23 DIAGNOSIS — I2699 Other pulmonary embolism without acute cor pulmonale: Secondary | ICD-10-CM

## 2013-07-23 DIAGNOSIS — I82409 Acute embolism and thrombosis of unspecified deep veins of unspecified lower extremity: Secondary | ICD-10-CM

## 2013-07-23 LAB — POCT INR: INR: 3.7

## 2013-07-23 MED ORDER — WARFARIN SODIUM 5 MG PO TABS: ORAL_TABLET | ORAL | Status: DC

## 2013-07-23 NOTE — Patient Instructions (Signed)
Patient instructed to take medications as defined in the Anti-coagulation Track section of this encounter.  Patient instructed to take today's dose.  Patient verbalized understanding of these instructions.    

## 2013-07-23 NOTE — Progress Notes (Signed)
Anti-Coagulation Progress Note  Kara Mcclain is a 42 y.o. female who is currently on an anti-coagulation regimen.    RECENT RESULTS: Recent results are below, the most recent result is correlated with a dose of 57.5 mg. per week:  Will reduce to 52.5mg /wk and RTC on 8-SEP-14. Lab Results  Component Value Date   INR 3.70 07/23/2013   INR 2.70 07/15/2013   INR 3.2 07/12/2013    ANTI-COAG DOSE: Anticoagulation Dose Instructions as of 07/23/2013     [name] Tue Wed Thu Fri Sat   New Dose 7.5 mg 7.5 mg 7.5 mg 7.5 mg 7.5 mg 7.5 mg 7.5 mg       ANTICOAG SUMMARY: Anticoagulation Episode Summary   Current INR goal 2.0-3.0  Next INR check 07/29/2013  INR from last check 3.70! (07/23/2013)  Weekly max dose   Target end date   [facility]  Preferred lab   Send INR reminders to    Indications  Acute DVT (deep venous thrombosis) [453.40] Acute pulmonary embolism [415.19] Long term (current) use of anticoagulants [V58.61]        Comments         ANTICOAG TODAY: Anticoagulation Summary as of 07/23/2013   INR goal 2.0-3.0  Selected INR 3.70! (07/23/2013)  Next INR check 07/29/2013  Target end date    Indications  Acute DVT (deep venous thrombosis) [453.40] Acute pulmonary embolism [415.19] Long term (current) use of anticoagulants [V58.61]      Anticoagulation Episode Summary   [facility]   Preferred lab    Send INR reminders to    Comments       PATIENT INSTRUCTIONS: Patient Instructions  Patient instructed to take medications as defined in the Anti-coagulation Track section of this encounter.  Patient instructed to take today's dose.  Patient verbalized understanding of these instructions.       FOLLOW-UP Return in 6 days (on 07/29/2013) for Follow up INR at 1015h.  [name], III Pharm.D., CACP

## 2013-07-29 ENCOUNTER — Ambulatory Visit (INDEPENDENT_AMBULATORY_CARE_PROVIDER_SITE_OTHER): Payer: No Typology Code available for payment source | Admitting: Pharmacist

## 2013-07-29 DIAGNOSIS — Z7901 Long term (current) use of anticoagulants: Secondary | ICD-10-CM

## 2013-07-29 DIAGNOSIS — I82409 Acute embolism and thrombosis of unspecified deep veins of unspecified lower extremity: Secondary | ICD-10-CM

## 2013-07-29 DIAGNOSIS — I2699 Other pulmonary embolism without acute cor pulmonale: Secondary | ICD-10-CM

## 2013-07-29 LAB — POCT INR: INR: 1.9

## 2013-07-29 NOTE — Progress Notes (Signed)
Anti-Coagulation Progress Note  JENILYN MAGANA is a 42 y.o. female who is currently on an anti-coagulation regimen.    RECENT RESULTS: Recent results are below, the most recent result is correlated with a dose of 52.5 mg. per week:  Will increase back to 75.5mg /week. Lab Results  Component Value Date   INR 1.90 07/29/2013   INR 3.70 07/23/2013   INR 2.70 07/15/2013    ANTI-COAG DOSE: Anticoagulation Dose Instructions as of 07/29/2013     [name] Tue Wed Thu Fri Sat   New Dose 7.5 mg 10 mg 7.5 mg 7.5 mg 10 mg 7.5 mg 7.5 mg       ANTICOAG SUMMARY: Anticoagulation Episode Summary   Current INR goal 2.0-3.0  Next INR check 08/12/2013  INR from last check 1.90! (07/29/2013)  Weekly max dose   Target end date   [facility]  Preferred lab   Send INR reminders to    Indications  Acute DVT (deep venous thrombosis) [453.40] Acute pulmonary embolism [415.19] Long term (current) use of anticoagulants [V58.61]        Comments         ANTICOAG TODAY: Anticoagulation Summary as of 07/29/2013   INR goal 2.0-3.0  Selected INR 1.90! (07/29/2013)  Next INR check 08/12/2013  Target end date    Indications  Acute DVT (deep venous thrombosis) [453.40] Acute pulmonary embolism [415.19] Long term (current) use of anticoagulants [V58.61]      Anticoagulation Episode Summary   [facility]   Preferred lab    Send INR reminders to    Comments       PATIENT INSTRUCTIONS: Patient Instructions  Patient instructed to take medications as defined in the Anti-coagulation Track section of this encounter.  Patient instructed to take today's dose.  Patient verbalized understanding of these instructions.       FOLLOW-UP Return in 2 weeks (on 08/12/2013) for Follow up INR at 1030h.  [name], III Pharm.D., CACP

## 2013-07-29 NOTE — Patient Instructions (Signed)
Patient instructed to take medications as defined in the Anti-coagulation Track section of this encounter.  Patient instructed to take today's dose.  Patient verbalized understanding of these instructions.    

## 2013-07-31 ENCOUNTER — Encounter: Payer: Self-pay | Admitting: Obstetrics & Gynecology

## 2013-07-31 ENCOUNTER — Ambulatory Visit (INDEPENDENT_AMBULATORY_CARE_PROVIDER_SITE_OTHER): Payer: No Typology Code available for payment source | Admitting: Obstetrics & Gynecology

## 2013-07-31 VITALS — BP 131/83 | HR 72 | Temp 98.5°F | Resp 20 | Ht 71.5 in | Wt 241.2 lb

## 2013-07-31 DIAGNOSIS — Z01419 Encounter for gynecological examination (general) (routine) without abnormal findings: Secondary | ICD-10-CM

## 2013-07-31 NOTE — Patient Instructions (Addendum)
Deep Vein Thrombosis A deep vein thrombosis (DVT) is a blood clot that develops in a deep vein. A DVT is a clot in the deep, larger veins of the leg, arm, or pelvis. These are more dangerous than clots that might form in veins near the surface of the body. A DVT can lead to complications if the clot breaks off and travels in the bloodstream to the lungs.  A DVT can damage the valves in your leg veins, so that instead of flowing upwards, the blood pools in the lower leg. This is called post-thrombotic syndrome, and can result in pain, swelling, discoloration, and sores on the leg. Once identified, a DVT can be treated. It can also be prevented in some circumstances. Once you have had a DVT, you may be at increased risk for a DVT in the future. CAUSES Blood clots form in a vein for different reasons. Usually several things contribute to blood clots. Contributing factors include:  The flow of blood slows down.  The inside of the vein is damaged in some way.  The person has a condition that makes blood clot more easily. Some people are more likely than others to develop blood clots. That is because they have more factors that make clots likely. These are called risk factors. Risk factors include:   Older age, especially over 75 years old.  Having a history of blood clots. This means you have had one before. Or, it means that someone else in your family has had blood clots. You may have a genetic tendency to form clots.  Having major or lengthy surgery. This is especially true for surgery on the hip, knee, or belly (abdomen). Hip surgery is particularly high risk.  Breaking a hip or leg.  Sitting or lying still for a long time. This includes long distance travel, paralysis, or recovery from an illness or surgery.  Cancer, or cancer treatment.  Having a long, thin tube (catheter) placed inside a vein during a medical procedure.  Being overweight (obese).  Pregnancy and childbirth. Hormone  changes make the blood clot more easily during pregnancy. The fetus puts pressure on the veins of the pelvis. There is also risk of injury to veins during delivery or a caesarean. The risk is at its highest just after childbirth.  Medicines with the female hormone estrogen. This includes birth control pills and hormone replacement therapy.  Smoking.  Other circulation or heart problems. SYMPTOMS When a clot forms, it can either partially or totally block the blood flow in that vein. Symptoms of a DVT can include:  Swelling of the leg or arm, especially if one side is much worse.  Warmth and redness of the leg or arm, especially if one side is much worse.  Pain in an arm or leg. If the clot is in the leg, symptoms may be more noticeable or worse when standing or walking. The symptoms of a DVT that has traveled to the lungs (pulmonary embolism, PE) usually start suddenly, and include:  Shortness of breath.  Coughing.  Coughing up blood or blood-tinged phlegm.  Chest pain. The chest pain is often worse with deep breaths.  Rapid heartbeat. Anyone with these symptoms should get emergency medical treatment right away. Call your local emergency services (911 in [place] if you have these symptoms. DIAGNOSIS If a DVT is suspected, your caregiver will take a full medical history and carry out a physical exam. Tests that also may be required include:  Blood tests, including studies of   the clotting properties of the blood.  Ultrasonography to see if you have clots in your legs or lungs.  X-rays to show the flow of blood when dye is injected into the veins (venography).  Studies of your lungs, if you have any chest symptoms. PREVENTION  Exercise the legs regularly. Take a brisk 30 minute walk every day.  Maintain a weight that is appropriate for your height.  Avoid sitting or lying in bed for long periods of time without moving your legs.  Women, particularly those over the age of 35,  should consider the risks and benefits of taking estrogen medicines, including birth control pills.  Do not smoke, especially if you take estrogen medicines.  Long distance travel can increase your risk of DVT. You should exercise your legs by walking or pumping the muscles every hour.  In-hospital prevention:  Many of the risk factors above relate to situations that exist with hospitalization, either for illness, injury, or elective surgery.  Your caregiver will assess you for the need for venous thromboembolism prophylaxis when you are admitted to the hospital. If you are having surgery, your surgeon will assess you the day of or day after surgery.  Prevention may include medical and nonmedical measures. TREATMENT Treatment for DVT helps prevent death and disability. The most common treatment for DVT is blood thinning (anticoagulant) medicine, which reduces the blood's tendency to clot. Anticoagulants can stop new blood clots from forming and old ones from growing. They cannot dissolve existing clots. Your body does this by itself over time. Anticoagulants can be given by mouth, by intravenous (IV) access, or by injection. Your caregiver will determine the best program for you.  Heparin or related medicines (low molecular weight heparin) are usually the first treatment for a blood clot. They act quickly. However, they cannot be taken orally.  Heparin can cause a fall in a component of blood that stops bleeding and forms blood clots (platelets). You will be monitored with blood tests to be sure this does not occur.  Warfarin is an anticoagulant that can be swallowed (taken orally). It takes a few days to start working, so usually heparin or related medicines are used in combination. Once warfarin is working, heparin is usually stopped.  Less commonly, clot dissolving drugs (thrombolytics) are used to dissolve a DVT. They carry a high risk of bleeding, so they are used mainly in severe cases,  where a life or limb is threatened.  Very rarely, a blood clot in the leg needs to be removed surgically.  If you are unable to take anticoagulants, your caregiver may arrange for you to have a filter placed in a main vein in your belly (abdomen). This filter prevents clots from traveling to your lungs. HOME CARE INSTRUCTIONS  Take all medicines prescribed by your caregiver. Follow the directions carefully.  Warfarin. Most people will continue taking warfarin after hospital discharge. Your caregiver will advise you on the length of treatment (usually 3 6 months, sometimes lifelong).  Too much and too little warfarin are both dangerous. Too much warfarin increases the risk of bleeding. Too little warfarin continues to allow the risk for blood clots. While taking warfarin, you will need to have regular blood tests to measure your blood clotting time. These blood tests usually include both the prothrombin time (PT) and international normalized ratio (INR) tests. The PT and INR results allow your caregiver to adjust your dose of warfarin. The dose can change for many reasons. It is critically important that   you take warfarin exactly as prescribed, and that you have your PT and INR levels drawn exactly as directed.  Many foods, especially foods high in vitamin K can interfere with warfarin and affect the PT and INR results. Foods high in vitamin K include spinach, kale, broccoli, cabbage, collard and turnip greens, brussels sprouts, peas, cauliflower, seaweed, and parsley as well as beef and pork liver, green tea, and soybean oil. You should eat a consistent amount of foods high in vitamin K. Avoid major changes in your diet, or notify your caregiver before changing your diet. Arrange a visit with a dietitian to answer your questions.  Many medicines can interfere with warfarin and affect the PT and INR results. You must tell your caregiver about any and all medicines you take, this includes all vitamins  and supplements. Be especially cautious with aspirin and anti-inflammatory medicines. Ask your caregiver before taking these. Do not take or discontinue any prescribed or over-the-counter medicine except on the advice of your caregiver or pharmacist.  Warfarin can have side effects, primarily excessive bruising or bleeding. You will need to hold pressure over cuts for longer than usual. Your caregiver or pharmacist will discuss other potential side effects.  Alcohol can change the body's ability to handle warfarin. It is best to avoid alcoholic drinks or consume only very small amounts while taking warfarin. Notify your caregiver if you change your alcohol intake.  Notify your dentist or other caregivers before procedures.  Activity. Ask your caregiver how soon you can go back to normal activities. It is important to stay active to prevent blood clots. If you are on anticoagulant medicine, avoid contact sports.  Exercise. It is very important to exercise. This is especially important while traveling, sitting or standing for long periods of time. Exercise your legs by walking or by pumping the muscles frequently. Take frequent walks.  Compression stockings. These are tight elastic stockings that apply pressure to the lower legs. This pressure can help keep the blood in the legs from clotting. You may need to wear compressions stockings at home to help prevent a DVT.  Smoking. If you smoke, quit. Ask your caregiver for help with quitting smoking.  Learn as much as you can about DVT. Knowing more about the condition should help you keep it from coming back.  Wear a medical alert bracelet or carry a medical alert card. SEEK MEDICAL CARE IF:  You notice a rapid heartbeat.  You feel weaker or more tired than usual.  You feel faint.  You notice increased bruising.  You feel your symptoms are not getting better in the time expected.  You believe you are having side effects of medicine. SEEK  IMMEDIATE MEDICAL CARE IF:  You have chest pain.  You have trouble breathing.  You have new or increased swelling or pain in one leg.  You cough up blood.  You notice blood in vomit, in a bowel movement, or in urine. MAKE SURE YOU:  Understand these instructions.  Will watch your condition.  Will get help right away if you are not doing well or get worse. Document Released: 11/07/2005 Document Revised: 08/01/2012 Document Reviewed: 12/30/2010 [employer] Patient Information 2014 [employer].  

## 2013-07-31 NOTE — Progress Notes (Signed)
Pt states she was recently hospitalized for DVT and PE. She is followed by Chancy Milroy, MD @ [facility] clinic.  Pt states she had previous early mammogram because of lumps under Rt axilla- was told she had fibrocystic breast.

## 2013-07-31 NOTE — Progress Notes (Signed)
Patient ID: Kara Mcclain, female   DOB: Dec 03, 1970, 42 y.o.   MRN: [id number] Subjective:     Kara Mcclain is a 42 y.o. female here for a routine exam.  Current complaints: no GYN complaints.  Separation from spouse- currently abstinant    Gynecologic History Patient's last menstrual period was 07/14/2013. Contraception: condoms Last mammogram: 2012. Results were: normal.  H/o fibrocystic breasts  Obstetric History OB History  Gravida Para Term Preterm AB SAB TAB Ectopic Multiple Living  0                  The following portions of the patient's history were reviewed and updated as appropriate: allergies, current medications, past family history, past medical history, past social history, past surgical history and problem list.  Review of Systems Pertinent items are noted in HPI.    Objective:    BP 131/83  Pulse 72  Temp(Src) 98.5 F (36.9 C) (Oral)  Resp 20  Ht 5' 11.5" (1.816 m)  Wt 241 lb 3.2 oz (109.408 kg)  BMI 33.18 kg/m2  LMP 07/14/2013  General Appearance:    Alert, cooperative, no distress, appears stated age  Head:    Normocephalic, without obvious abnormality, atraumatic           Throat:   Lips, mucosa, and tongue normal; teeth and gums normal  Neck:   Supple, symmetrical, trachea midline, no adenopathy;    thyroid:  no enlargement/tenderness/nodules; no carotid   bruit or JVD  Back:     Symmetric, no curvature, ROM normal, no CVA tenderness  Lungs:     Clear to auscultation bilaterally, respirations unlabored  Chest Wall:    No tenderness or deformity   Heart:    Regular rate and rhythm, S1 and S2 normal, no murmur, rub   or gallop  Breast Exam:    No tenderness, masses, or nipple abnormality  Abdomen:     Soft, non-tender, bowel sounds active all four quadrants,    no masses, no organomegaly  Genitalia:    Normal female without lesion, discharge or tenderness     Extremities:   Extremities normal, atraumatic, no cyanosis or edema  Pulses:   2+  and symmetric all extremities  Skin:   Skin color, texture, turgor normal, no rashes or lesions            Assessment:    Healthy female exam.  H/o recent DVT/PE- discussed with pt the importance of prevention of pregnancy for 6months to 1 year      Plan:    Follow up in: 1 year.   Screening mammorgram F/u PAP and cx

## 2013-08-05 ENCOUNTER — Ambulatory Visit (HOSPITAL_COMMUNITY)
Admission: RE | Admit: 2013-08-05 | Discharge: 2013-08-05 | Disposition: A | Discharge status: Home / Self Care | Payer: No Typology Code available for payment source | Source: Ambulatory Visit | Attending: Obstetrics & Gynecology | Admitting: Obstetrics & Gynecology

## 2013-08-05 DIAGNOSIS — Z1231 Encounter for screening mammogram for malignant neoplasm of breast: Secondary | ICD-10-CM | POA: Insufficient documentation

## 2013-08-05 DIAGNOSIS — Z01419 Encounter for gynecological examination (general) (routine) without abnormal findings: Secondary | ICD-10-CM

## 2013-08-12 ENCOUNTER — Ambulatory Visit (INDEPENDENT_AMBULATORY_CARE_PROVIDER_SITE_OTHER): Payer: No Typology Code available for payment source | Admitting: Pharmacist

## 2013-08-12 DIAGNOSIS — I82402 Acute embolism and thrombosis of unspecified deep veins of left lower extremity: Secondary | ICD-10-CM

## 2013-08-12 DIAGNOSIS — I2699 Other pulmonary embolism without acute cor pulmonale: Secondary | ICD-10-CM

## 2013-08-12 DIAGNOSIS — I82409 Acute embolism and thrombosis of unspecified deep veins of unspecified lower extremity: Secondary | ICD-10-CM

## 2013-08-12 DIAGNOSIS — Z7901 Long term (current) use of anticoagulants: Secondary | ICD-10-CM

## 2013-08-12 NOTE — Progress Notes (Signed)
Please note that today's adjustment was a decrease from 57.5.mg weekly to 55 mg weekly.

## 2013-08-12 NOTE — Patient Instructions (Signed)
Patient instructed to take medications as defined in the Anti-coagulation Track section of this encounter.  Patient instructed to take today's dose.  Patient verbalized understanding of these instructions.    

## 2013-08-12 NOTE — Progress Notes (Signed)
Anti-Coagulation Progress Note  Kara Mcclain is a 42 y.o. female who is currently on an anti-coagulation regimen.    RECENT RESULTS: Recent results are below, the most recent result is correlated with a dose of 52.5 mg. per week: Lab Results  Component Value Date   INR 3.10 08/12/2013   INR 1.90 07/29/2013   INR 3.70 07/23/2013    ANTI-COAG DOSE: Anticoagulation Dose Instructions as of 08/12/2013     [name] Tue Wed Thu Fri Sat   New Dose 7.5 mg 7.5 mg 7.5 mg 10 mg 7.5 mg 7.5 mg 7.5 mg       ANTICOAG SUMMARY: Anticoagulation Episode Summary   Current INR goal 2.0-3.0  Next INR check 08/26/2013  INR from last check 3.10! (08/12/2013)  Weekly max dose   Target end date   [facility]  Preferred lab   Send INR reminders to    Indications  Acute DVT (deep venous thrombosis) [453.40] Acute pulmonary embolism [415.19] Long term (current) use of anticoagulants [V58.61]        Comments         ANTICOAG TODAY: Anticoagulation Summary as of 08/12/2013   INR goal 2.0-3.0  Selected INR 3.10! (08/12/2013)  Next INR check 08/26/2013  Target end date    Indications  Acute DVT (deep venous thrombosis) [453.40] Acute pulmonary embolism [415.19] Long term (current) use of anticoagulants [V58.61]      Anticoagulation Episode Summary   [facility]   Preferred lab    Send INR reminders to    Comments       PATIENT INSTRUCTIONS: Patient Instructions  Patient instructed to take medications as defined in the Anti-coagulation Track section of this encounter.  Patient instructed to take today's dose.  Patient verbalized understanding of these instructions.       FOLLOW-UP Return in 2 weeks (on 08/26/2013) for Follow up INR at 1030h.  [name], III Pharm.D., CACP

## 2013-08-14 ENCOUNTER — Encounter: Payer: Self-pay | Admitting: Internal Medicine

## 2013-08-14 ENCOUNTER — Ambulatory Visit (INDEPENDENT_AMBULATORY_CARE_PROVIDER_SITE_OTHER): Payer: No Typology Code available for payment source | Admitting: Internal Medicine

## 2013-08-14 VITALS — BP 121/81 | HR 75 | Temp 97.8°F | Ht 71.5 in | Wt 243.5 lb

## 2013-08-14 DIAGNOSIS — I82409 Acute embolism and thrombosis of unspecified deep veins of unspecified lower extremity: Secondary | ICD-10-CM

## 2013-08-14 DIAGNOSIS — R079 Chest pain, unspecified: Secondary | ICD-10-CM

## 2013-08-14 DIAGNOSIS — Z23 Encounter for immunization: Secondary | ICD-10-CM

## 2013-08-14 DIAGNOSIS — I82402 Acute embolism and thrombosis of unspecified deep veins of left lower extremity: Secondary | ICD-10-CM

## 2013-08-14 DIAGNOSIS — Z7901 Long term (current) use of anticoagulants: Secondary | ICD-10-CM

## 2013-08-14 DIAGNOSIS — I2699 Other pulmonary embolism without acute cor pulmonale: Secondary | ICD-10-CM

## 2013-08-14 DIAGNOSIS — Z0000 Encounter for general adult medical examination without abnormal findings: Secondary | ICD-10-CM

## 2013-08-14 NOTE — Assessment & Plan Note (Addendum)
Described chest pain, likely related to PE versus anxiety. Chest exam is unremarkable. She reports 10 pills of Vicodin, which she takes as needed. For chest pain. She reports relief.  Plan. - Encourage her to continue using Vicodin as needed for chest pain, with expectation that her chest pain will reduce  - I again counseled the patient about symptoms of PE including dizziness, persistent severe chest pain, or acute onset of shortness of breath, and encouraged her to call the clinic or present to the emergency department, if she develops those symptoms. Patient verbalized understanding. - Will continue with current Coumadin. INR therapeutic. She is compliant. - f/u with Dr Alexandria Lodge.  Addendum: - discussed with Dr Dalphine Handing after the patient had left. We both felt that 2d echocardiogram might be helpful to work up this chest pain to evaluate to right heart pressures. Another possibility is pericarditis. If the pain persists for more that a few weeks, I will refer her to cardiology. For now ordered an echocardiogram. Called the patient and informed her of this test. I will send a message to [facility] to assist her arrange for this test.

## 2013-08-14 NOTE — Addendum Note (Signed)
Addended by: Dow Adolph on: 08/14/2013 05:32 PM   Modules accepted: Orders

## 2013-08-14 NOTE — Assessment & Plan Note (Signed)
Up to date with vaccinations, mammogram and Pap smear.

## 2013-08-14 NOTE — Assessment & Plan Note (Signed)
Continue with coumadin. Follow up with Dr Alexandria Lodge.

## 2013-08-14 NOTE — Progress Notes (Signed)
Patient ID: Kara Mcclain, female   DOB: 04-Jun-1971, 42 y.o.   MRN: [id number]   Subjective:   HPI: Kara Mcclain is a 42 y.o. woman with past medical history of left lower extremity DVT, accompanied by pulmonary embolism in August 2014 presents for routine followup visit.   She was evaluated by Dr. Virgina Organ on 07/16/2013, with complaints of intermittent sharp, chest pain, located on the left side of her chest. The pains last a few seconds. She reports that this chest pain, improved significantly with Vicodin, which she infrequently takes (1 pill 1-3 times a week as needed). She denies any syncope episodes, cough, shortness of breath, palpitations, dizziness, or any other cardiopulmonary symptoms. She is compliant with her regular Coumadin, with a good therapeutic range. She follows closely with Dr. Alexandria Lodge for INR checks, and Coumadin dose adjustments.  No other complaints. She recently had a mammogram and a Pap smear, which both were negative.  Past Medical History  Diagnosis Date  . Pulmonary embolism 07/04/2013  . DVT (deep venous thrombosis) 07/04/2013    "LLE" (07/04/2013)   Current Outpatient Prescriptions  Medication Sig Dispense Refill  . acetaminophen (TYLENOL) 325 MG tablet Take 650 mg by mouth every 6 (six) hours as needed for pain.      Marland Kitchen warfarin (COUMADIN) 5 MG tablet Take 1&1/2 tablets daily on M-W-F-S-S. Take 2 tablets on T-Th. .       No current facility-administered medications for this visit.   Family History  Problem Relation Age of Onset  . Pulmonary embolism Mother   . Deep vein thrombosis Mother   . Cancer Mother   . Hypertension Mother   . Stroke Father   . Gout Sister   . Diabetes Sister   . Hypertension Sister    History   Social History  . Marital Status: Married    Spouse Name: N/A    Number of Children: N/A  . Years of Education: N/A   Social History Main Topics  . Smoking status: Never Smoker   . Smokeless tobacco: Never Used  . Alcohol  Use: 1.8 oz/week    3 Shots of liquor per week     Comment: sometimes  . Drug Use: No     Comment: 07/04/2013 "last marijuana ~ 3 wks ago"  . Sexual Activity: Yes    Birth Control/ Protection: None   Other Topics Concern  . None   Social History Narrative  . None   Review of Systems: Constitutional: Denies fever, chills, diaphoresis, appetite change and fatigue.  Respiratory: Denies SOB, DOE, cough, chest tightness, and wheezing.  Cardiovascular: No chest pain, palpitations and leg swelling.  Gastrointestinal: No abdominal pain, nausea, vomiting, bloody stools Genitourinary: No dysuria, frequency, hematuria, or flank pain.  Musculoskeletal: No myalgias, back pain, joint swelling, arthralgias . Uses a TED hose on the left lower extremity with gradual improvement of the swelling and pain.  Objective:  Physical Exam: Filed Vitals:   08/14/13 1439  BP: 121/81  Pulse: 75  Temp: 97.8 F (36.6 C)  TempSrc: Oral  Height: 5' 11.5" (1.816 m)  Weight: 243 lb 8 oz (110.451 kg)  SpO2: 100%   General: Pleasant young woman, Well nourished. No acute distress.  Lungs: CTA bilaterally. Heart: RRR; no extra sounds or murmurs, JVD not elevated.  Abdomen: Non-distended, normal BS, soft, nontender; no hepatosplenomegaly  Extremities: No pedal edema.  TED hose presenton the left lower extremity. Good pulses bilaterally. Neurologic: Alert and oriented x3. No obvious neurologic  deficits.  Assessment & Plan:  I have discussed my assessment and plan  with Dr. Dalphine Handing as detailed under problem based charting.

## 2013-08-14 NOTE — Patient Instructions (Addendum)
Please take your coumadin as prescribed Call clinic anytime with questions. We will give you a flu shot and a tetanus shot.  I will get your results of the pap smear and mammogram from [facility] Please come back in 2-3 months

## 2013-08-15 NOTE — Addendum Note (Signed)
Addended by: Angelina Ok F on: 08/15/2013 08:31 AM   Modules accepted: Orders

## 2013-08-16 NOTE — Progress Notes (Signed)
Case discussed with Dr.Kazibwe at the time of the visit.  We reviewed the resident's history and exam and pertinent patient test results.  I agree with the assessment, diagnosis, and plan of care documented in the resident's note.    

## 2013-08-20 ENCOUNTER — Other Ambulatory Visit: Payer: Self-pay | Admitting: *Deleted

## 2013-08-22 ENCOUNTER — Other Ambulatory Visit: Payer: Self-pay | Admitting: Internal Medicine

## 2013-08-22 DIAGNOSIS — K089 Disorder of teeth and supporting structures, unspecified: Secondary | ICD-10-CM

## 2013-08-23 ENCOUNTER — Ambulatory Visit (HOSPITAL_COMMUNITY)
Admission: RE | Admit: 2013-08-23 | Discharge: 2013-08-23 | Disposition: A | Discharge status: Home / Self Care | Payer: No Typology Code available for payment source | Source: Ambulatory Visit | Attending: Internal Medicine | Admitting: Internal Medicine

## 2013-08-23 DIAGNOSIS — R079 Chest pain, unspecified: Secondary | ICD-10-CM

## 2013-08-23 DIAGNOSIS — R0789 Other chest pain: Secondary | ICD-10-CM | POA: Insufficient documentation

## 2013-08-23 DIAGNOSIS — Z86711 Personal history of pulmonary embolism: Secondary | ICD-10-CM | POA: Insufficient documentation

## 2013-08-23 DIAGNOSIS — I82409 Acute embolism and thrombosis of unspecified deep veins of unspecified lower extremity: Secondary | ICD-10-CM | POA: Insufficient documentation

## 2013-08-23 DIAGNOSIS — I2699 Other pulmonary embolism without acute cor pulmonale: Secondary | ICD-10-CM | POA: Insufficient documentation

## 2013-08-23 NOTE — Progress Notes (Signed)
*  PRELIMINARY RESULTS* Echocardiogram 2D Echocardiogram has been performed.  Kara Mcclain 08/23/2013, 4:23 PM

## 2013-08-26 ENCOUNTER — Ambulatory Visit (INDEPENDENT_AMBULATORY_CARE_PROVIDER_SITE_OTHER): Payer: No Typology Code available for payment source | Admitting: Pharmacist

## 2013-08-26 ENCOUNTER — Ambulatory Visit (INDEPENDENT_AMBULATORY_CARE_PROVIDER_SITE_OTHER): Payer: No Typology Code available for payment source | Admitting: Internal Medicine

## 2013-08-26 ENCOUNTER — Encounter: Payer: Self-pay | Admitting: Internal Medicine

## 2013-08-26 VITALS — BP 124/72 | HR 73 | Temp 97.6°F | Ht 71.5 in | Wt 246.6 lb

## 2013-08-26 DIAGNOSIS — I82409 Acute embolism and thrombosis of unspecified deep veins of unspecified lower extremity: Secondary | ICD-10-CM

## 2013-08-26 DIAGNOSIS — I2699 Other pulmonary embolism without acute cor pulmonale: Secondary | ICD-10-CM

## 2013-08-26 DIAGNOSIS — R079 Chest pain, unspecified: Secondary | ICD-10-CM

## 2013-08-26 DIAGNOSIS — Z7901 Long term (current) use of anticoagulants: Secondary | ICD-10-CM

## 2013-08-26 LAB — POCT INR: INR: 2.2

## 2013-08-26 MED ORDER — WARFARIN SODIUM 5 MG PO TABS: ORAL_TABLET | ORAL | Status: DC

## 2013-08-26 NOTE — Assessment & Plan Note (Signed)
No bleeding issues. Continue with coumadin. Has appt with Dr Alexandria Lodge today.

## 2013-08-26 NOTE — Assessment & Plan Note (Addendum)
Her described chest pain raised a concern for possibily pericarditis. Echo on 08/23/2013 was normal without precardial effusion. She continues to have intermittent chest pains.   Plan  - I again discussed with Dr Dalphine Handing, my attending in clinic.  - pt would benefit from NSAIDs if cause of these pain is pericarditis - rec referral to cardiology for further evaluation

## 2013-08-26 NOTE — Progress Notes (Signed)
Anti-Coagulation Progress Note  Kara Mcclain is a 42 y.o. female who is currently on an anti-coagulation regimen.    RECENT RESULTS: Recent results are below, the most recent result is correlated with a dose of 55 mg. per week: Lab Results  Component Value Date   INR 2.2 08/26/2013   INR 3.10 08/12/2013   INR 1.90 07/29/2013    ANTI-COAG DOSE: Anticoagulation Dose Instructions as of 08/26/2013     [name] Tue Wed Thu Fri Sat   New Dose 7.5 mg 7.5 mg 7.5 mg 10 mg 7.5 mg 7.5 mg 7.5 mg       ANTICOAG SUMMARY: Anticoagulation Episode Summary   Current INR goal 2.0-3.0  Next INR check 09/16/2013  INR from last check 2.2 (08/26/2013)  Weekly max dose   Target end date   [facility]  Preferred lab   Send INR reminders to    Indications  Acute DVT (deep venous thrombosis) [453.40] Acute pulmonary embolism [415.19] Long term (current) use of anticoagulants [V58.61]        Comments         ANTICOAG TODAY: Anticoagulation Summary as of 08/26/2013   INR goal 2.0-3.0  Selected INR 2.2 (08/26/2013)  Next INR check 09/16/2013  Target end date    Indications  Acute DVT (deep venous thrombosis) [453.40] Acute pulmonary embolism [415.19] Long term (current) use of anticoagulants [V58.61]      Anticoagulation Episode Summary   [facility]   Preferred lab    Send INR reminders to    Comments       PATIENT INSTRUCTIONS: Patient Instructions  Patient instructed to take medications as defined in the Anti-coagulation Track section of this encounter.  Patient instructed to take today's dose.  Patient verbalized understanding of these instructions.       FOLLOW-UP Return in 3 weeks (on 09/16/2013) for Follow up INR at 1030h.  [name], III Pharm.D., CACP

## 2013-08-26 NOTE — Progress Notes (Signed)
Patient ID: Kara Mcclain, female   DOB: [birth year], 42 y.o.   MRN: [id number]   Subjective:   HPI: [name] is a 42 y.o. woman with past medical history of left lower extremity DVT, accompanied by pulmonary embolism in August 2014 presents for routine followup visit.   She returns for a follow up visit regarding her intermittent chest pain. She reports that on 08/24/2013, she had 2 episodes of "punching" central chest pain with each episode lasting about 5 seconds. The intensity of the chest pain reached 4/10 with each episode. She is not sure whether her chest pain is related to anxiety. She denies any other accompanying cardiopulmonary symptoms. After she took one pill of Tylenol, she did not experience any more chest pains and she is chest pain free during clinic visit today.   12-lead EKG on 07/17/2013 was marked for prolonged QTC and nonspecific T-wave changes, but otherwise normal. Echocardiogram on 08/23/2013 was normal.  She denies any syncope episodes, cough, shortness of breath, palpitations, dizziness, or any other cardiopulmonary symptoms. She is compliant with her regular Coumadin, with a good therapeutic range. She follows closely with Dr. Alexandria Lodge for INR checks, and Coumadin dose adjustments.  No other complaints.   Past Medical History  Diagnosis Date  . Pulmonary embolism 07/04/2013  . DVT (deep venous thrombosis) 07/04/2013    "LLE" (07/04/2013)   Current Outpatient Prescriptions  Medication Sig Dispense Refill  . acetaminophen (TYLENOL) 325 MG tablet Take 650 mg by mouth every 6 (six) hours as needed for pain.      Marland Kitchen warfarin (COUMADIN) 5 MG tablet Take 1&1/2 tablets daily EXCEPT on Wednesdays--then take 2 tablets every Wednesday .  50 tablet  2   No current facility-administered medications for this visit.   Family History  Problem Relation Age of Onset  . Pulmonary embolism Mother   . Deep vein thrombosis Mother   . Cancer Mother   . Hypertension Mother    . Stroke Father   . Gout Sister   . Diabetes Sister   . Hypertension Sister    History   Social History  . Marital Status: Married    Spouse Name: N/A    Number of Children: N/A  . Years of Education: N/A   Social History Main Topics  . Smoking status: Never Smoker   . Smokeless tobacco: Never Used  . Alcohol Use: 1.8 oz/week    3 Shots of liquor per week     Comment: sometimes  . Drug Use: No     Comment: 07/04/2013 "last marijuana ~ 3 wks ago"  . Sexual Activity: Yes    Birth Control/ Protection: None   Other Topics Concern  . None   Social History Narrative  . None   Review of Systems: Constitutional: Denies fever, chills, diaphoresis, appetite change and fatigue.  Respiratory: Denies SOB, DOE, cough, chest tightness, and wheezing.  Cardiovascular: No chest pain, palpitations and leg swelling.  Gastrointestinal: No abdominal pain, nausea, vomiting, bloody stools Genitourinary: No dysuria, frequency, hematuria, or flank pain.  Musculoskeletal: No myalgias, back pain, joint swelling, arthralgias . Uses a TED hose on the left lower extremity with gradual improvement of the swelling and pain.  Objective:  Physical Exam: Filed Vitals:   08/26/13 0956  BP: 124/72  Pulse: 73  Temp: 97.6 F (36.4 C)  TempSrc: Oral  Height: 5' 11.5" (1.816 m)  Weight: 246 lb 9.6 oz (111.857 kg)  SpO2: 100%   General: Pleasant young woman, Well  nourished. No acute distress.  Lungs: CTA bilaterally. Heart: RRR; no extra sounds or murmurs, JVD not elevated.  Abdomen: Non-distended, normal BS, soft, nontender; no hepatosplenomegaly  Extremities: No pedal edema.  TED hose presenton the left lower extremity. Good pulses bilaterally. Neurologic: Alert and oriented x3. No obvious neurologic deficits.  Assessment & Plan:  I have discussed my assessment and plan  with Dr. Dalphine Handing as detailed under problem based charting.

## 2013-08-26 NOTE — Patient Instructions (Signed)
I will refer you to cardiology for more evaluation to make sure we are not missing anything for your chest pain  Please take Tylenol as needed for chest pain  Please follow up with Dr Alexandria Lodge Please come back to see me in 2 months.

## 2013-08-26 NOTE — Patient Instructions (Signed)
Patient instructed to take medications as defined in the Anti-coagulation Track section of this encounter.  Patient instructed to take today's dose.  Patient verbalized understanding of these instructions.    

## 2013-08-30 ENCOUNTER — Other Ambulatory Visit: Payer: Self-pay | Admitting: Internal Medicine

## 2013-08-30 DIAGNOSIS — R079 Chest pain, unspecified: Secondary | ICD-10-CM

## 2013-08-30 NOTE — Progress Notes (Addendum)
Case discussed with Dr. Zada Girt at the time of the visit.  We reviewed the resident's history and exam and pertinent patient test results.  I agree with the assessment, diagnosis, and plan of care documented in the resident's note.   Her chest pain seems non-anginal from symptoms (EKG not significant for ACS). It could be pleuritic post PE, musculoskeletal chest wall pain (alleviation with tylenol) or a remote possibility would be pericarditis. I agree with cardiology referral for further evaluation. A remote diagnosis of GERD could be there, however, the patient has not described typical reflux symptoms, there is no association with food intake, and the patient has so far experienced this event only twice. I discussed possible PPI therapy with Dr. Zada Girt, if the pain is persistent, either now or once the patient is evaluated by cardiology.

## 2013-09-16 ENCOUNTER — Ambulatory Visit (INDEPENDENT_AMBULATORY_CARE_PROVIDER_SITE_OTHER): Payer: No Typology Code available for payment source | Admitting: Pharmacist

## 2013-09-16 DIAGNOSIS — I82409 Acute embolism and thrombosis of unspecified deep veins of unspecified lower extremity: Secondary | ICD-10-CM

## 2013-09-16 DIAGNOSIS — Z7901 Long term (current) use of anticoagulants: Secondary | ICD-10-CM

## 2013-09-16 DIAGNOSIS — I2699 Other pulmonary embolism without acute cor pulmonale: Secondary | ICD-10-CM

## 2013-09-16 LAB — POCT INR: INR: 1.7

## 2013-09-16 MED ORDER — WARFARIN SODIUM 5 MG PO TABS: ORAL_TABLET | ORAL | Status: DC

## 2013-09-16 NOTE — Progress Notes (Signed)
Indication: Acute venous thromboembolism. Duration: Until 01/05/2014, then reassess. INR: Below target. Agree with Dr. Groce's assessment and plan. 

## 2013-09-16 NOTE — Patient Instructions (Signed)
Patient instructed to take medications as defined in the Anti-coagulation Track section of this encounter.  Patient instructed to take today's dose.  Patient verbalized understanding of these instructions.    

## 2013-09-16 NOTE — Progress Notes (Signed)
Anti-Coagulation Progress Note  Kara Mcclain is a 42 y.o. female who is currently on an anti-coagulation regimen.    RECENT RESULTS: Recent results are below, the most recent result is correlated with a dose of 55 mg. per week: Lab Results  Component Value Date   INR 1.70 09/16/2013   INR 2.2 08/26/2013   INR 3.10 08/12/2013    ANTI-COAG DOSE: Anticoagulation Dose Instructions as of 09/16/2013     [name] Tue Wed Thu Fri Sat   New Dose 7.5 mg 10 mg 10 mg 10 mg 7.5 mg 10 mg 7.5 mg       ANTICOAG SUMMARY: Anticoagulation Episode Summary   Current INR goal 2.0-3.0  Next INR check 09/30/2013  INR from last check 1.70! (09/16/2013)  Weekly max dose   Target end date   [facility]  Preferred lab   Send INR reminders to    Indications  Acute DVT (deep venous thrombosis) [453.40] Acute pulmonary embolism [415.19] Long term (current) use of anticoagulants [V58.61]        Comments         ANTICOAG TODAY: Anticoagulation Summary as of 09/16/2013   INR goal 2.0-3.0  Selected INR 1.70! (09/16/2013)  Next INR check 09/30/2013  Target end date    Indications  Acute DVT (deep venous thrombosis) [453.40] Acute pulmonary embolism [415.19] Long term (current) use of anticoagulants [V58.61]      Anticoagulation Episode Summary   [facility]   Preferred lab    Send INR reminders to    Comments       PATIENT INSTRUCTIONS: Patient Instructions  Patient instructed to take medications as defined in the Anti-coagulation Track section of this encounter.  Patient instructed to take today's dose.  Patient verbalized understanding of these instructions.       FOLLOW-UP Return in 2 weeks (on 09/30/2013) for Follow up INR at 1015h.  [name], III Pharm.D., CACP

## 2013-09-17 ENCOUNTER — Encounter: Payer: Self-pay | Admitting: *Deleted

## 2013-09-17 ENCOUNTER — Encounter: Payer: Self-pay | Admitting: Cardiovascular Disease

## 2013-09-18 ENCOUNTER — Institutional Professional Consult (permissible substitution): Payer: No Typology Code available for payment source | Admitting: Cardiovascular Disease

## 2013-09-19 ENCOUNTER — Ambulatory Visit (INDEPENDENT_AMBULATORY_CARE_PROVIDER_SITE_OTHER): Payer: No Typology Code available for payment source | Admitting: Cardiovascular Disease

## 2013-09-19 ENCOUNTER — Encounter: Payer: Self-pay | Admitting: Cardiovascular Disease

## 2013-09-19 ENCOUNTER — Other Ambulatory Visit: Payer: Self-pay | Admitting: Internal Medicine

## 2013-09-19 VITALS — BP 124/76 | HR 74 | Ht 71.0 in | Wt 238.0 lb

## 2013-09-19 DIAGNOSIS — I2699 Other pulmonary embolism without acute cor pulmonale: Secondary | ICD-10-CM

## 2013-09-19 DIAGNOSIS — R079 Chest pain, unspecified: Secondary | ICD-10-CM

## 2013-09-19 DIAGNOSIS — J432 Centrilobular emphysema: Secondary | ICD-10-CM | POA: Insufficient documentation

## 2013-09-19 DIAGNOSIS — R9389 Abnormal findings on diagnostic imaging of other specified body structures: Secondary | ICD-10-CM

## 2013-09-19 LAB — D-DIMER, QUANTITATIVE: D-Dimer, Quant: 0.5 ug/mL-FEU — ABNORMAL HIGH (ref 0.00–0.48)

## 2013-09-19 NOTE — Assessment & Plan Note (Signed)
Atypical.  Check d-dimer to make sure no evidence of recurrent PE.  F/U stress echo in 4 weeks when she is 3 months out from PE

## 2013-09-19 NOTE — Assessment & Plan Note (Signed)
Nonsmoker with no history of asthma.  CT showed parenchymal lung disease with emphysema and bronchiectasis  Refer to pulmonary

## 2013-09-19 NOTE — Progress Notes (Signed)
Patient ID: Kara Mcclain, female   DOB: [birth year], 42 y.o.   MRN: [id number] Kara Mcclain is a 42 y.o. woman with past medical history of left lower extremity DVT, accompanied by pulmonary embolism in August 2014 presents for routine followup visit.  She returns for a follow up visit regarding her intermittent chest pain. She reports that on 08/24/2013, she had 2 episodes of "punching" central chest pain with each episode lasting about 5 seconds. The intensity of the chest pain reached 4/10 with each episode. She is not sure whether her chest pain is related to anxiety. She denies any other accompanying cardiopulmonary symptoms. After she took one pill of Tylenol, she did not experience any more chest pains and she is chest pain free during clinic visit today.  12-lead EKG on 07/17/2013 was marked for prolonged QTC and nonspecific T-wave changes, but otherwise normal. Echocardiogram on 08/23/2013 was normal.  CT 8/14 IMPRESSION: Extensive lower lobe pulmonary emboli bilaterally. Underlying centrilobular emphysema with mild lower lobe bronchiectasis. No edema or consolidation.  Since d/c doing well Has had some atypical chest pain.  Sharp fleeting central chest.  She is doing Zumba and aerobics without issue.  Recent ECG with primary ok She has a mother with history of blood clots.  Hypercoag w/u negative   ROS: Denies fever, malais, weight loss, blurry vision, decreased visual acuity, cough, sputum, SOB, hemoptysis, pleuritic pain, palpitaitons, heartburn, abdominal pain, melena, lower extremity edema, claudication, or rash.  All other systems reviewed and negative   General: Affect appropriate Healthy:  appears stated age HEENT: normal Neck supple with no adenopathy JVP normal no bruits no thyromegaly Lungs clear with no wheezing and good diaphragmatic motion Heart:  S1/S2 no murmur,rub, gallop or click PMI normal Abdomen: benighn, BS positve, no tenderness, no AAA no bruit.  No  HSM or HJR Distal pulses intact with no bruits No edema Neuro non-focal Skin warm and dry No muscular weakness  Medications Current Outpatient Prescriptions  Medication Sig Dispense Refill  . warfarin (COUMADIN) 5 MG tablet Take 2 tablets on Mondays/Tuesdays/Wednesdays/Fridays; Take 1& 1/2 tablets on all other days.  55 tablet  2   No current facility-administered medications for this visit.    Allergies Review of patient's allergies indicates no known allergies.  Family History: Family History  Problem Relation Age of Onset  . Pulmonary embolism Mother   . Deep vein thrombosis Mother   . Cancer Mother   . Hypertension Mother   . Stroke Father   . Gout Sister   . Diabetes Sister   . Hypertension Sister     Social History: History   Social History  . Marital Status: Married    Spouse Name: N/A    Number of Children: N/A  . Years of Education: N/A   Occupational History  . Not on file.   Social History Main Topics  . Smoking status: Never Smoker   . Smokeless tobacco: Never Used  . Alcohol Use: 1.8 oz/week    3 Shots of liquor per week     Comment: sometimes  . Drug Use: No     Comment: 07/04/2013 "last marijuana ~ 3 wks ago"  . Sexual Activity: Yes    Birth Control/ Protection: None   Other Topics Concern  . Not on file   Social History Narrative  . No narrative on file    Electrocardiogram:  8/26  SR 88 nonspecific ST/T wave changes   Assessment and Plan

## 2013-09-19 NOTE — Patient Instructions (Signed)
Your physician recommends that you schedule a follow-up appointment in: AS NEEDED  Your physician recommends that you continue on your current medications as directed. Please refer to the Current Medication list given to you today.  Your physician has requested that you have a stress echocardiogram. For further information please visit [email or web address] Please follow instruction sheet as given. IN  1 MONTH  Your physician recommends that you return for lab work in:  TODAY  D DIMER You have been referred to PULMONARY  DX  PE

## 2013-09-19 NOTE — Assessment & Plan Note (Signed)
F/U Dr Alexandria Lodge  Continue coumadin ? Change to xarelto.  Given mothers history would consider f/u CT and LE doppler to document resolution of clots before stopping coumadin.  Echo with no RV strain.  INR has been Rx

## 2013-09-20 ENCOUNTER — Encounter: Payer: Self-pay | Admitting: Internal Medicine

## 2013-09-20 ENCOUNTER — Telehealth: Payer: Self-pay | Admitting: Cardiovascular Disease

## 2013-09-20 NOTE — Telephone Encounter (Signed)
New Problem:  Pt states a nurse called her earlier today with her test results. Pt states she wants someone to go over her results more thoroughly with her. Pt states she did not get a full understanding of all her results and their meaning. Please advise

## 2013-09-20 NOTE — Telephone Encounter (Signed)
PT  AWARE./CY 

## 2013-09-26 ENCOUNTER — Ambulatory Visit (INDEPENDENT_AMBULATORY_CARE_PROVIDER_SITE_OTHER): Payer: No Typology Code available for payment source | Admitting: Internal Medicine

## 2013-09-26 ENCOUNTER — Other Ambulatory Visit: Payer: No Typology Code available for payment source

## 2013-09-26 ENCOUNTER — Encounter: Payer: Self-pay | Admitting: Internal Medicine

## 2013-09-26 VITALS — BP 120/78 | HR 77 | Temp 98.3°F | Ht 71.5 in | Wt 242.0 lb

## 2013-09-26 DIAGNOSIS — R079 Chest pain, unspecified: Secondary | ICD-10-CM

## 2013-09-26 DIAGNOSIS — J432 Centrilobular emphysema: Secondary | ICD-10-CM

## 2013-09-26 DIAGNOSIS — J438 Other emphysema: Secondary | ICD-10-CM

## 2013-09-26 NOTE — Assessment & Plan Note (Signed)
The pain is described to me as fleeting migratory and not pleuritic so most likely this is not related to any pulmonary or cardiac source, reassured that she was not having more blood clots

## 2013-09-26 NOTE — Progress Notes (Signed)
  Subjective:    Patient ID: Kara Mcclain, female    DOB: [birth year]   MRN: [id number]  HPI  39 yobf never smoker but grew up in house full of smokers referred by Dr Eden Emms for eval of abn CT suggesting emphysema.   09/26/2013 [facility]st [facility] office visit/ Wert with new onset L leg swelling in Aug 2014 and [place] dx of pe at [facility] rx hep/ coumadin and ex tol back to baseline, leg swelling better but not resolved new fleeting(2-3 secs) cp in LUQ maybe 2-3 times per  Week, migratory L Anterior to just below rib cage but  not pleuritic, more common when quiet or supine but does not typically wake her up.  No obvious pattern in day to day or daytime variabilty or assoc chronic cough or cp or chest tightness, subjective wheeze overt sinus or hb symptoms. No unusual exp hx or h/o childhood pna/ asthma or knowledge of premature birth.  Sleeping ok without nocturnal  or early am exacerbation  of respiratory  c/o's or need for noct saba. Also denies any obvious fluctuation of symptoms with weather or environmental changes or other aggravating or alleviating factors except as outlined above   Current Medications, Allergies, Complete Past Medical History, Past Surgical History, Family History, and Social History were reviewed in [employer] record.           Review of Systems  Constitutional: Negative for fever and unexpected weight change.  HENT: Negative for congestion, dental problem, ear pain, nosebleeds, postnasal drip, rhinorrhea, sinus pressure, sneezing, sore throat and trouble swallowing.   Eyes: Negative for redness and itching.  Respiratory: Positive for chest tightness and shortness of breath. Negative for cough and wheezing.   Cardiovascular: Positive for leg swelling. Negative for palpitations.  Gastrointestinal: Negative for nausea and vomiting.  Genitourinary: Negative for dysuria.  Musculoskeletal: Negative for joint swelling.  Skin:  Negative for rash.  Neurological: Negative for headaches.  Hematological: Does not bruise/bleed easily.  Psychiatric/Behavioral: Negative for dysphoric mood. The patient is not nervous/anxious.        Objective:   Physical Exam Wt Readings from Last 3 Encounters:  09/26/13 242 lb (109.77 kg)  09/19/13 238 lb (107.956 kg)  08/26/13 246 lb 9.6 oz (111.857 kg)      HEENT: nl dentition, turbinates, and orophanx. Nl external ear canals without cough reflex   NECK :  without JVD/Nodes/TM/ nl carotid upstrokes bilaterally   LUNGS: no acc muscle use, clear to A and P bilaterally without cough on insp or exp maneuvers   CV:  RRR  no s3 or murmur or increase in P2, no edema   ABD:  soft and nontender with nl excursion in the supine position. No bruits or organomegaly, bowel sounds nl  MS:  warm without deformities, calf tenderness, cyanosis or clubbing  SKIN: warm and dry without lesions    NEURO:  alert, approp, no deficits    CTa Chest 07/04/13 Extensive lower lobe pulmonary emboli bilaterally. Underlying  centrilobular emphysema with mild lower lobe bronchiectasis. No  edema or consolidation.       Assessment & Plan:

## 2013-09-26 NOTE — Assessment & Plan Note (Signed)
07/04/2013: Noted on Chest CT performed for PE >>Underlying centrilobular emphysema with mild lower lobe bronchiectasis.  No clinical correlation so Although there are clearly abnormalities on CT scan, they should probably be considered "microscopic" since not obvious on plain cxr and do not need to be addressed further x for baseline pft's and alpha one screening

## 2013-09-26 NOTE — Patient Instructions (Signed)
Please remember to go to the lab  department downstairs for your tests - we will call you with the results when they are available.  Please schedule a follow up office visit in 6 weeks, call sooner if needed with pfts on return       

## 2013-09-28 LAB — ALPHA-1-ANTITRYPSIN: A-1 Antitrypsin, Ser: 142 mg/dL (ref 90–200)

## 2013-09-30 ENCOUNTER — Ambulatory Visit (INDEPENDENT_AMBULATORY_CARE_PROVIDER_SITE_OTHER): Payer: No Typology Code available for payment source | Admitting: Pharmacist

## 2013-09-30 DIAGNOSIS — I2699 Other pulmonary embolism without acute cor pulmonale: Secondary | ICD-10-CM

## 2013-09-30 DIAGNOSIS — Z7901 Long term (current) use of anticoagulants: Secondary | ICD-10-CM

## 2013-09-30 DIAGNOSIS — I82409 Acute embolism and thrombosis of unspecified deep veins of unspecified lower extremity: Secondary | ICD-10-CM

## 2013-09-30 LAB — POCT INR: INR: 3.1

## 2013-09-30 NOTE — Patient Instructions (Signed)
Patient instructed to take medications as defined in the Anti-coagulation Track section of this encounter.  Patient instructed to take today's dose.  Patient verbalized understanding of these instructions.    

## 2013-09-30 NOTE — Progress Notes (Signed)
Anti-Coagulation Progress Note  Kara Mcclain is a 42 y.o. female who is currently on an anti-coagulation regimen.    RECENT RESULTS: Recent results are below, the most recent result is correlated with a dose of 62.5 mg. per week: Lab Results  Component Value Date   INR 3.10 09/30/2013   INR 1.70 09/16/2013   INR 2.2 08/26/2013    ANTI-COAG DOSE: Anticoagulation Dose Instructions as of 09/30/2013     [name] Tue Wed Thu Fri Sat   New Dose 7.5 mg 10 mg 7.5 mg 10 mg 7.5 mg 10 mg 7.5 mg       ANTICOAG SUMMARY: Anticoagulation Episode Summary   Current INR goal 2.0-3.0  Next INR check 10/21/2013  INR from last check 3.10! (09/30/2013)  Weekly max dose   Target end date   [facility]  Preferred lab   Send INR reminders to    Indications  Acute DVT (deep venous thrombosis) [453.40] Acute pulmonary embolism [415.19] Long term (current) use of anticoagulants [V58.61]        Comments         ANTICOAG TODAY: Anticoagulation Summary as of 09/30/2013   INR goal 2.0-3.0  Selected INR 3.10! (09/30/2013)  Next INR check 10/21/2013  Target end date    Indications  Acute DVT (deep venous thrombosis) [453.40] Acute pulmonary embolism [415.19] Long term (current) use of anticoagulants [V58.61]      Anticoagulation Episode Summary   [facility]   Preferred lab    Send INR reminders to    Comments       PATIENT INSTRUCTIONS: Patient Instructions  Patient instructed to take medications as defined in the Anti-coagulation Track section of this encounter.  Patient instructed to take today's dose.  Patient verbalized understanding of these instructions.       FOLLOW-UP Return in 3 weeks (on 10/21/2013) for Follow up INR at 1030h.  [name], III Pharm.D., CACP

## 2013-10-01 ENCOUNTER — Encounter: Payer: Self-pay | Admitting: Internal Medicine

## 2013-10-01 NOTE — Progress Notes (Signed)
Indication: Acute venous thromboembolism. Duration: Until 01/05/2014, then reassess. INR: Below target. Agree with Dr. Saralyn Pilar assessment and plan.

## 2013-10-03 LAB — ALPHA-1 ANTITRYPSIN PHENOTYPE: A-1 Antitrypsin: 122 mg/dL (ref 83–199)

## 2013-10-04 ENCOUNTER — Encounter: Payer: Self-pay | Admitting: Internal Medicine

## 2013-10-04 NOTE — Progress Notes (Signed)
Quick Note:  lmom informing her labs were normal ______

## 2013-10-09 ENCOUNTER — Telehealth: Payer: Self-pay | Admitting: *Deleted

## 2013-10-09 NOTE — Telephone Encounter (Signed)
Pt called stating she is having a lot of dental pain, she feels she has cavity on bottom part of mouth.  She has shooting pain to mouth to ear.  She has pounding in her ear. She can't chew on that side.  She was seen in clinic by Dr Zada Girt and pt states he has given her vicodin.  She rates pain 5/10 and in cold 9/10. Warm cloth does help. She has been out of pain meds for 3 weeks, she is using tylenol without relief. Pt is waiting for a referral to dental clinic.  Referral put in 10/2  -   She needs an urgent referral at this time.   Pt # [phone]

## 2013-10-10 ENCOUNTER — Ambulatory Visit (HOSPITAL_BASED_OUTPATIENT_CLINIC_OR_DEPARTMENT_OTHER): Payer: No Typology Code available for payment source

## 2013-10-10 ENCOUNTER — Encounter: Payer: Self-pay | Admitting: Cardiovascular Disease

## 2013-10-10 ENCOUNTER — Ambulatory Visit (HOSPITAL_COMMUNITY): Payer: No Typology Code available for payment source | Attending: Cardiovascular Disease | Admitting: Radiology

## 2013-10-10 DIAGNOSIS — R072 Precordial pain: Secondary | ICD-10-CM

## 2013-10-10 DIAGNOSIS — Z86718 Personal history of other venous thrombosis and embolism: Secondary | ICD-10-CM | POA: Insufficient documentation

## 2013-10-10 DIAGNOSIS — F1210 Cannabis abuse, uncomplicated: Secondary | ICD-10-CM | POA: Insufficient documentation

## 2013-10-10 DIAGNOSIS — R0602 Shortness of breath: Secondary | ICD-10-CM

## 2013-10-10 DIAGNOSIS — R079 Chest pain, unspecified: Secondary | ICD-10-CM | POA: Insufficient documentation

## 2013-10-10 DIAGNOSIS — R0989 Other specified symptoms and signs involving the circulatory and respiratory systems: Secondary | ICD-10-CM

## 2013-10-10 MED ORDER — PERFLUTREN PROTEIN A MICROSPH IV SUSP
3.0000 mL | Freq: Once | INTRAVENOUS | Status: AC
  Administered 2013-10-10: 3 mL via INTRAVENOUS

## 2013-10-10 MED ORDER — HYDROCODONE-ACETAMINOPHEN 5-325 MG PO TABS: 1.0000 | ORAL_TABLET | Freq: Four times a day (QID) | ORAL | Status: DC | PRN

## 2013-10-10 NOTE — Telephone Encounter (Signed)
Rx ready - pt called. 

## 2013-10-10 NOTE — Telephone Encounter (Signed)
I do not know when her dental appt is so I guessed on the # of hydrocodone pills.   I had to create a new Rx bc the first was set to phone in.

## 2013-10-10 NOTE — Addendum Note (Signed)
Addended by: Oren Bracket on: 10/10/2013 05:18 PM   Modules accepted: Orders

## 2013-10-10 NOTE — Progress Notes (Signed)
Stress Echocardiogram performed.  

## 2013-10-15 ENCOUNTER — Ambulatory Visit: Payer: No Typology Code available for payment source

## 2013-10-21 ENCOUNTER — Ambulatory Visit (INDEPENDENT_AMBULATORY_CARE_PROVIDER_SITE_OTHER): Payer: No Typology Code available for payment source | Admitting: Pharmacist

## 2013-10-21 DIAGNOSIS — I82409 Acute embolism and thrombosis of unspecified deep veins of unspecified lower extremity: Secondary | ICD-10-CM

## 2013-10-21 DIAGNOSIS — Z7901 Long term (current) use of anticoagulants: Secondary | ICD-10-CM

## 2013-10-21 DIAGNOSIS — I2699 Other pulmonary embolism without acute cor pulmonale: Secondary | ICD-10-CM

## 2013-10-21 MED ORDER — WARFARIN SODIUM 5 MG PO TABS: ORAL_TABLET | ORAL | Status: DC

## 2013-10-21 NOTE — Progress Notes (Signed)
Anti-Coagulation Progress Note  Kara Mcclain is a 42 y.o. female who is currently on an anti-coagulation regimen.    RECENT RESULTS: Recent results are below, the most recent result is correlated with a dose of 60 mg. per week: Lab Results  Component Value Date   INR 3.30 10/21/2013   INR 3.10 09/30/2013   INR 1.70 09/16/2013    ANTI-COAG DOSE: Anticoagulation Dose Instructions as of 10/21/2013     [name] Tue Wed Thu Fri Sat   New Dose 7.5 mg 7.5 mg 7.5 mg 10 mg 7.5 mg 7.5 mg 7.5 mg       ANTICOAG SUMMARY: Anticoagulation Episode Summary   Current INR goal 2.0-3.0  Next INR check 11/11/2013  INR from last check 3.30! (10/21/2013)  Weekly max dose   Target end date   [facility]  Preferred lab   Send INR reminders to    Indications  Acute DVT (deep venous thrombosis) [453.40] Acute pulmonary embolism [415.19] Long term (current) use of anticoagulants [V58.61]        Comments         ANTICOAG TODAY: Anticoagulation Summary as of 10/21/2013   INR goal 2.0-3.0  Selected INR 3.30! (10/21/2013)  Next INR check 11/11/2013  Target end date    Indications  Acute DVT (deep venous thrombosis) [453.40] Acute pulmonary embolism [415.19] Long term (current) use of anticoagulants [V58.61]      Anticoagulation Episode Summary   [facility]   Preferred lab    Send INR reminders to    Comments       PATIENT INSTRUCTIONS: Patient Instructions  Patient instructed to take medications as defined in the Anti-coagulation Track section of this encounter.  Patient instructed to take today's dose.  Patient verbalized understanding of these instructions.       FOLLOW-UP Return in 3 weeks (on 11/11/2013) for Follow up INR at 1015h.  [name], III Pharm.D., CACP

## 2013-10-21 NOTE — Patient Instructions (Signed)
Patient instructed to take medications as defined in the Anti-coagulation Track section of this encounter.  Patient instructed to take today's dose.  Patient verbalized understanding of these instructions.    

## 2013-10-21 NOTE — Progress Notes (Signed)
agree

## 2013-11-07 ENCOUNTER — Ambulatory Visit (INDEPENDENT_AMBULATORY_CARE_PROVIDER_SITE_OTHER): Payer: No Typology Code available for payment source | Admitting: Internal Medicine

## 2013-11-07 ENCOUNTER — Encounter: Payer: Self-pay | Admitting: Internal Medicine

## 2013-11-07 VITALS — BP 110/72 | HR 70 | Temp 97.9°F | Ht 69.5 in | Wt 242.0 lb

## 2013-11-07 DIAGNOSIS — J432 Centrilobular emphysema: Secondary | ICD-10-CM

## 2013-11-07 DIAGNOSIS — J438 Other emphysema: Secondary | ICD-10-CM

## 2013-11-07 LAB — PULMONARY FUNCTION TEST
DL/VA % pred: 76 %
DL/VA: 4.14 ml/min/mmHg/L
FEF 25-75 Pre: 4.21 L/sec
FEF2575-%Change-Post: 29 %
FEF2575-%Pred-Post: 168 %
FEV1-%Change-Post: 6 %
FEV1-%Pred-Post: 122 %
FEV1-%Pred-Pre: 114 %
FEV1-Post: 3.74 L
FEV1-Pre: 3.49 L
FEV1FVC-%Change-Post: 3 %
FEV1FVC-%Pred-Pre: 102 %
FEV6-%Change-Post: 3 %
FEV6-Post: 4.23 L
FVC-%Change-Post: 3 %
FVC-%Pred-Post: 113 %
Post FEV1/FVC ratio: 88 %
Pre FEV1/FVC ratio: 85 %
Pre FEV6/FVC Ratio: 100 %
RV: 1.2 L
TLC: 5.57 L

## 2013-11-07 NOTE — Progress Notes (Signed)
PFT done today. 

## 2013-11-07 NOTE — Progress Notes (Signed)
  Subjective:    Patient ID: Kara Mcclain, female    DOB: [birth year]   MRN: [id number]  Brief patient profile:  42 yobf never smoker but grew up in house full of smokers referred by Kara Mcclain for eval of abn CT suggesting emphysema.    History of Present Illness  09/26/2013 1st [facility] office visit/ Kara Mcclain with new onset L leg swelling in Aug 2014 and [place] and dx of pe at [facility] rx hep/ coumadin and ex tol back to baseline, leg swelling better but not resolved new fleeting(2-3 secs) cp in LUQ maybe 2-3 times per  Week, migratory L Anterior to just below rib cage but  not pleuritic, more common when quiet or supine but does not typically wake her up. rec Check alpha one AT >  MM   11/07/2013 f/u ov/Kara Mcclain re: f/u ? Copd > pfts wnl  Chief Complaint  Patient presents with  . Followup with PFT    Pt states that her breathing is doing well and denies any new co's today.        No obvious day to day or daytime variabilty or assoc chronic cough or cp or chest tightness, subjective wheeze overt sinus or hb symptoms. No unusual exp hx or h/o childhood pna/ asthma or knowledge of premature birth.  Sleeping ok without nocturnal  or early am exacerbation  of respiratory  c/o's or need for noct saba. Also denies any obvious fluctuation of symptoms with weather or environmental changes or other aggravating or alleviating factors except as outlined above   Current Medications, Allergies, Complete Past Medical History, Past Surgical History, Family History, and Social History were reviewed in [employer] record.  ROS  The following are not active complaints unless bolded sore throat, dysphagia, dental problems, itching, sneezing,  nasal congestion or excess/ purulent secretions, ear ache,   fever, chills, sweats, unintended wt loss, pleuritic or exertional cp, hemoptysis,  orthopnea pnd or leg swelling, presyncope, palpitations, heartburn, abdominal pain,  anorexia, nausea, vomiting, diarrhea  or change in bowel or urinary habits, change in stools or urine, dysuria,hematuria,  rash, arthralgias, visual complaints, headache, numbness weakness or ataxia or problems with walking or coordination,  change in mood/affect or memory.              Objective:   Physical Exam  11/07/2013      242 Wt Readings from Last 3 Encounters:  09/26/13 242 lb (109.77 kg)  09/19/13 238 lb (107.956 kg)  08/26/13 246 lb 9.6 oz (111.857 kg)      HEENT: nl dentition, turbinates, and orophanx. Nl external ear canals without cough reflex   NECK :  without JVD/Nodes/TM/ nl carotid upstrokes bilaterally   LUNGS: no acc muscle use, clear to A and P bilaterally without cough on insp or exp maneuvers   CV:  RRR  no s3 or murmur or increase in P2, no edema   ABD:  soft and nontender with nl excursion in the supine position. No bruits or organomegaly, bowel sounds nl  MS:  warm without deformities, calf tenderness, cyanosis or clubbing       CTa Chest 07/04/13 Extensive lower lobe pulmonary emboli bilaterally. Underlying  centrilobular emphysema with mild lower lobe bronchiectasis. No  edema or consolidation.       Assessment & Plan:

## 2013-11-07 NOTE — Patient Instructions (Signed)
Your lung function is normal so you don't need any pulmonary medications or further pulmonary follow up

## 2013-11-07 NOTE — Assessment & Plan Note (Signed)
07/04/2013: Noted on Chest CT performed for PE >>Underlying centrilobular emphysema with mild lower lobe bronchiectasis. - alpha one AT levels sent 09/26/2013 >> MM with level 122  - PFTs  11/07/2013  wnl       No functional impact so no need for rx or pulmonary f/u

## 2013-11-11 ENCOUNTER — Ambulatory Visit (INDEPENDENT_AMBULATORY_CARE_PROVIDER_SITE_OTHER): Payer: No Typology Code available for payment source | Admitting: Pharmacist

## 2013-11-11 DIAGNOSIS — I2699 Other pulmonary embolism without acute cor pulmonale: Secondary | ICD-10-CM

## 2013-11-11 DIAGNOSIS — Z7901 Long term (current) use of anticoagulants: Secondary | ICD-10-CM

## 2013-11-11 DIAGNOSIS — I82409 Acute embolism and thrombosis of unspecified deep veins of unspecified lower extremity: Secondary | ICD-10-CM

## 2013-11-11 MED ORDER — WARFARIN SODIUM 5 MG PO TABS: ORAL_TABLET | ORAL | Status: DC

## 2013-11-11 NOTE — Progress Notes (Signed)
Anti-Coagulation Progress Note  Kara Mcclain is a 42 y.o. female who is currently on an anti-coagulation regimen.    RECENT RESULTS: Recent results are below, the most recent result is correlated with a dose of 55 mg. per week: Lab Results  Component Value Date   INR 2.30 11/11/2013   INR 3.30 10/21/2013   INR 3.10 09/30/2013    ANTI-COAG DOSE: Anticoagulation Dose Instructions as of 11/11/2013     [name] Tue Wed Thu Fri Sat   New Dose 7.5 mg 10 mg 7.5 mg 7.5 mg 10 mg 7.5 mg 7.5 mg       ANTICOAG SUMMARY: Anticoagulation Episode Summary   Current INR goal 2.0-3.0  Next INR check 11/25/2013  INR from last check 2.30 (11/11/2013)  Weekly max dose   Target end date   [facility]  Preferred lab   Send INR reminders to    Indications  Acute DVT (deep venous thrombosis) [453.40] Acute pulmonary embolism [415.19] Long term (current) use of anticoagulants [V58.61]        Comments         ANTICOAG TODAY: Anticoagulation Summary as of 11/11/2013   INR goal 2.0-3.0  Selected INR 2.30 (11/11/2013)  Next INR check 11/25/2013  Target end date    Indications  Acute DVT (deep venous thrombosis) [453.40] Acute pulmonary embolism [415.19] Long term (current) use of anticoagulants [V58.61]      Anticoagulation Episode Summary   [facility]   Preferred lab    Send INR reminders to    Comments       PATIENT INSTRUCTIONS: Patient Instructions  Patient instructed to take medications as defined in the Anti-coagulation Track section of this encounter.  Patient instructed to take today's dose.  Patient verbalized understanding of these instructions.       FOLLOW-UP Return in 2 weeks (on 11/25/2013) for Follow up INR at 1100h.  [name], III Pharm.D., CACP

## 2013-11-11 NOTE — Patient Instructions (Signed)
Patient instructed to take medications as defined in the Anti-coagulation Track section of this encounter.  Patient instructed to take today's dose.  Patient verbalized understanding of these instructions.    

## 2013-11-11 NOTE — Progress Notes (Signed)
Indication: Acute venous thromboembolism. Duration: 01/05/2014. INR: At target. Agree with Dr. Saralyn Pilar assessment and plan.

## 2013-11-25 ENCOUNTER — Ambulatory Visit (INDEPENDENT_AMBULATORY_CARE_PROVIDER_SITE_OTHER): Payer: No Typology Code available for payment source | Admitting: Pharmacist

## 2013-11-25 DIAGNOSIS — I2699 Other pulmonary embolism without acute cor pulmonale: Secondary | ICD-10-CM

## 2013-11-25 DIAGNOSIS — I82409 Acute embolism and thrombosis of unspecified deep veins of unspecified lower extremity: Secondary | ICD-10-CM

## 2013-11-25 DIAGNOSIS — Z7901 Long term (current) use of anticoagulants: Secondary | ICD-10-CM

## 2013-11-25 LAB — POCT INR: INR: 2.7

## 2013-11-25 NOTE — Progress Notes (Signed)
Anti-Coagulation Progress Note  Kara Mcclain is a 43 y.o. female who is currently on an anti-coagulation regimen.    RECENT RESULTS: Recent results are below, the most recent result is correlated with a dose of 57.5 mg. per week: Lab Results  Component Value Date   INR 2.70 11/25/2013   INR 2.30 11/11/2013   INR 3.30 10/21/2013    ANTI-COAG DOSE: Anticoagulation Dose Instructions as of 11/25/2013     Glynis SmilesSun Mon Tue Wed Thu Fri Sat   New Dose 7.5 mg 10 mg 7.5 mg 7.5 mg 10 mg 7.5 mg 7.5 mg       ANTICOAG SUMMARY: Anticoagulation Episode Summary   Current INR goal 2.0-3.0  Next INR check 12/23/2013  INR from last check 2.70 (11/25/2013)  Weekly max dose   Target end date   [facility]  Preferred lab   Send INR reminders to    Indications  Acute DVT (deep venous thrombosis) [453.40] Acute pulmonary embolism [415.19] Long term (current) use of anticoagulants [V58.61]        Comments         ANTICOAG TODAY: Anticoagulation Summary as of 11/25/2013   INR goal 2.0-3.0  Selected INR 2.70 (11/25/2013)  Next INR check 12/23/2013  Target end date    Indications  Acute DVT (deep venous thrombosis) [453.40] Acute pulmonary embolism [415.19] Long term (current) use of anticoagulants [V58.61]      Anticoagulation Episode Summary   [facility]   Preferred lab    Send INR reminders to    Comments       PATIENT INSTRUCTIONS: Patient Instructions  Patient instructed to take medications as defined in the Anti-coagulation Track section of this encounter.  Patient instructed to take today's dose.  Patient verbalized understanding of these instructions.       FOLLOW-UP Return in 4 weeks (on 12/23/2013) for Follow up INR at 1115h.  Hulen LusterJames Savier Trickett, III Pharm.D., CACP

## 2013-11-25 NOTE — Patient Instructions (Signed)
Patient instructed to take medications as defined in the Anti-coagulation Track section of this encounter.  Patient instructed to take today's dose.  Patient verbalized understanding of these instructions.    

## 2013-11-26 NOTE — Progress Notes (Signed)
Indication: Acute venous thromboembolism.  Duration: Until 01/04/2014 and then reassess.  INR: At target.  Agree with Dr. Saralyn PilarGroce's assessment and plan.

## 2013-12-06 ENCOUNTER — Telehealth: Payer: Self-pay | Admitting: *Deleted

## 2013-12-06 NOTE — Telephone Encounter (Signed)
Pt called upset - has to have a tooth pulled 12/12/13 Dr Egbert Garibaldiehm - he placed pt on ibuprofen 800mg  every 6 hours and clindamycin for 7 days. States Dr Alexandria LodgeGroce always ask about antibiotics. Dr Egbert Garibaldiehm is to do INR on 12/09/13. Pt has started on antibiotic only. Stanton KidneyDebra Mirai Greenwood RN 12/06/13 12N

## 2013-12-09 NOTE — Telephone Encounter (Signed)
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 Call from patient involved that she was having odontalgia which had required her to see a dentist who has planned an extraction for Thursday 22-JAN-15. He has prescribed clinidamycin which she takes QID as well as ibuprofen for its anti-inflammatory effect as well as its analgesic effect. The patient was concerned and had recalled I had always advised for her to call when placed upon any new medications. Since there was no way to have a PT/INR performed that day for ME to interpret, she was advised to HOLD/OMIT that day's dose of warfarin (which she did). She was advised that [facility] 19-JAN-15 in observance of MLK Day, I would still meet her here at the [facility] on ground floor. Patient was advised after the ONE omitted dose, to take Sunday's dose--which she did, [facility] and to NOT take warfarin before seeing me. [facility] and her fingerstick INR was 2.7. She has been advised to HOLD warfarin commencing today through Thursday 22-JAN-15 procedure. She was advised to seek the counsel of her dentist insofar as recommencing, i.e. Can she recommence on Thursday 22-JAN-15. If he indicates she needs to omit that day's dose, she was advised that she should recommence the following day. She will RTC on Monday 02-FEB-15 at 1030h for repeat INR. She will recommence upon her regularly scheduled dose of which she was given a copy.

## 2013-12-23 ENCOUNTER — Ambulatory Visit (INDEPENDENT_AMBULATORY_CARE_PROVIDER_SITE_OTHER): Payer: No Typology Code available for payment source | Admitting: Pharmacist

## 2013-12-23 DIAGNOSIS — I2699 Other pulmonary embolism without acute cor pulmonale: Secondary | ICD-10-CM

## 2013-12-23 DIAGNOSIS — Z7901 Long term (current) use of anticoagulants: Secondary | ICD-10-CM

## 2013-12-23 DIAGNOSIS — I82409 Acute embolism and thrombosis of unspecified deep veins of unspecified lower extremity: Secondary | ICD-10-CM

## 2013-12-23 LAB — POCT INR: INR: 2.6

## 2013-12-23 MED ORDER — WARFARIN SODIUM 5 MG PO TABS: ORAL_TABLET | ORAL | Status: DC

## 2013-12-23 NOTE — Patient Instructions (Signed)
Patient instructed to take medications as defined in the Anti-coagulation Track section of this encounter.  Patient instructed to take today's dose.  Patient verbalized understanding of these instructions.    

## 2013-12-23 NOTE — Progress Notes (Signed)
Anti-Coagulation Progress Note  Kara Mcclain is a 43 y.o. female who is currently on an anti-coagulation regimen.    RECENT RESULTS: Recent results are below, the most recent result is correlated with a dose of 57.5 mg. per week: Lab Results  Component Value Date   INR 2.60 12/23/2013   INR 2.70 11/25/2013   INR 2.30 11/11/2013    ANTI-COAG DOSE: Anticoagulation Dose Instructions as of 12/23/2013     Glynis SmilesSun Mon Tue Wed Thu Fri Sat   New Dose 7.5 mg 10 mg 7.5 mg 7.5 mg 10 mg 7.5 mg 7.5 mg       ANTICOAG SUMMARY: Anticoagulation Episode Summary   Current INR goal 2.0-3.0  Next INR check 01/20/2014  INR from last check 2.60 (12/23/2013)  Weekly max dose   Target end date   [facility]  Preferred lab   Send INR reminders to    Indications  Acute DVT (deep venous thrombosis) [453.40] Acute pulmonary embolism [415.19] Long term (current) use of anticoagulants [V58.61]        Comments         ANTICOAG TODAY: Anticoagulation Summary as of 12/23/2013   INR goal 2.0-3.0  Selected INR 2.60 (12/23/2013)  Next INR check 01/20/2014  Target end date    Indications  Acute DVT (deep venous thrombosis) [453.40] Acute pulmonary embolism [415.19] Long term (current) use of anticoagulants [V58.61]      Anticoagulation Episode Summary   [facility]   Preferred lab    Send INR reminders to    Comments       PATIENT INSTRUCTIONS: Patient Instructions  Patient instructed to take medications as defined in the Anti-coagulation Track section of this encounter.  Patient instructed to take today's dose.  Patient verbalized understanding of these instructions.       FOLLOW-UP Return in 4 weeks (on 01/20/2014) for Follow up INR at 1100h.  Hulen LusterJames Caprisha Bridgett, III Pharm.D., CACP

## 2014-01-20 ENCOUNTER — Ambulatory Visit (INDEPENDENT_AMBULATORY_CARE_PROVIDER_SITE_OTHER): Payer: No Typology Code available for payment source | Admitting: Pharmacist

## 2014-01-20 DIAGNOSIS — I2699 Other pulmonary embolism without acute cor pulmonale: Secondary | ICD-10-CM

## 2014-01-20 DIAGNOSIS — Z7901 Long term (current) use of anticoagulants: Secondary | ICD-10-CM

## 2014-01-20 DIAGNOSIS — I82409 Acute embolism and thrombosis of unspecified deep veins of unspecified lower extremity: Secondary | ICD-10-CM

## 2014-01-20 LAB — POCT INR: INR: 3.5

## 2014-01-20 MED ORDER — WARFARIN SODIUM 5 MG PO TABS: 7.5000 mg | ORAL_TABLET | Freq: Every day | ORAL | Status: DC

## 2014-01-20 NOTE — Patient Instructions (Signed)
Patient instructed to take medications as defined in the Anti-coagulation Track section of this encounter.  Patient instructed to take today's dose.  Patient verbalized understanding of these instructions.    

## 2014-01-20 NOTE — Progress Notes (Signed)
Anti-Coagulation Progress Note  Si Gaulnisa C Atkerson is a 43 y.o. female who is currently on an anti-coagulation regimen.    RECENT RESULTS: Recent results are below, the most recent result is correlated with a dose of 57.5 mg. per week: Lab Results  Component Value Date   INR 3.50 01/20/2014   INR 2.60 12/23/2013   INR 2.70 11/25/2013    ANTI-COAG DOSE: Anticoagulation Dose Instructions as of 01/20/2014     Glynis SmilesSun Mon Tue Wed Thu Fri Sat   New Dose 7.5 mg 7.5 mg 7.5 mg 7.5 mg 7.5 mg 7.5 mg 7.5 mg       ANTICOAG SUMMARY: Anticoagulation Episode Summary   Current INR goal 2.0-3.0  Next INR check 02/10/2014  INR from last check 3.50! (01/20/2014)  Weekly max dose   Target end date   [facility]  Preferred lab   Send INR reminders to    Indications  Acute DVT (deep venous thrombosis) [453.40] Acute pulmonary embolism [415.19] Long term (current) use of anticoagulants [V58.61]        Comments         ANTICOAG TODAY: Anticoagulation Summary as of 01/20/2014   INR goal 2.0-3.0  Selected INR 3.50! (01/20/2014)  Next INR check 02/10/2014  Target end date    Indications  Acute DVT (deep venous thrombosis) [453.40] Acute pulmonary embolism [415.19] Long term (current) use of anticoagulants [V58.61]      Anticoagulation Episode Summary   [facility]   Preferred lab    Send INR reminders to    Comments       PATIENT INSTRUCTIONS: Patient Instructions  Patient instructed to take medications as defined in the Anti-coagulation Track section of this encounter.  Patient instructed to take today's dose.  Patient verbalized understanding of these instructions.       FOLLOW-UP Return in 3 weeks (on 02/10/2014) for Follow up INR at 1100h.  Hulen LusterJames Brando Taves, III Pharm.D., CACP

## 2014-02-10 ENCOUNTER — Ambulatory Visit (INDEPENDENT_AMBULATORY_CARE_PROVIDER_SITE_OTHER): Payer: No Typology Code available for payment source | Admitting: Pharmacist

## 2014-02-10 DIAGNOSIS — Z7901 Long term (current) use of anticoagulants: Secondary | ICD-10-CM

## 2014-02-10 DIAGNOSIS — I2699 Other pulmonary embolism without acute cor pulmonale: Secondary | ICD-10-CM

## 2014-02-10 DIAGNOSIS — Z86718 Personal history of other venous thrombosis and embolism: Secondary | ICD-10-CM

## 2014-02-10 DIAGNOSIS — I82409 Acute embolism and thrombosis of unspecified deep veins of unspecified lower extremity: Secondary | ICD-10-CM

## 2014-02-10 LAB — POCT INR: INR: 1.4

## 2014-02-10 NOTE — Progress Notes (Signed)
Anti-Coagulation Progress Note  Kara Mcclain is a 43 y.o. female who is currently on an anti-coagulation regimen.    RECENT RESULTS: Recent results are below, the most recent result is correlated with a dose of 52.5 mg. per week: Lab Results  Component Value Date   INR 1.40 02/10/2014   INR 3.50 01/20/2014   INR 2.60 12/23/2013    ANTI-COAG DOSE: Anticoagulation Dose Instructions as of 02/10/2014     Glynis SmilesSun Mon Tue Wed Thu Fri Sat   New Dose 7.5 mg 7.5 mg 7.5 mg 7.5 mg 7.5 mg 7.5 mg 7.5 mg       ANTICOAG SUMMARY: Anticoagulation Episode Summary   Current INR goal 2.0-3.0  Next INR check 02/10/2014  INR from last check 3.50! (01/20/2014)  Most recent INR 1.40! (02/10/2014)  Weekly max dose   Target end date   [facility]  Preferred lab   Send INR reminders to    Indications  Acute DVT (deep venous thrombosis) [453.40] Acute pulmonary embolism [415.19] Long term (current) use of anticoagulants [V58.61]        Comments         ANTICOAG TODAY: Anticoagulation Summary as of 02/10/2014   INR goal 2.0-3.0  Selected INR   Next INR check   Target end date    Indications  Acute DVT (deep venous thrombosis) [453.40] Acute pulmonary embolism [415.19] Long term (current) use of anticoagulants [V58.61]      Anticoagulation Episode Summary   [facility]   Preferred lab    Send INR reminders to    Comments       PATIENT INSTRUCTIONS: Patient Instructions  Patient instructed to take medications as defined in the Anti-coagulation Track section of this encounter.  Patient instructed to take today's dose.  Patient verbalized understanding of these instructions.       FOLLOW-UP Return in 3 weeks (on 03/03/2014) for Follow up INR at 1145h.  Hulen LusterJames Darcey Cardy, III Pharm.D., CACP

## 2014-02-10 NOTE — Progress Notes (Signed)
Indication: Unprovoked venous thromboembolism.  Duration: May 2015.  INR: Below target.  Agree with Dr. Saralyn PilarGroce's assessment and plan.  Has an appointment with her PCP Dr. Zada GirtKazibwe to discuss the risks and benefits of continued anticoagulation on 04/09/2014.

## 2014-02-10 NOTE — Patient Instructions (Signed)
Patient instructed to take medications as defined in the Anti-coagulation Track section of this encounter.  Patient instructed to take today's dose.  Patient verbalized understanding of these instructions.    

## 2014-02-12 ENCOUNTER — Encounter: Payer: Self-pay | Admitting: Internal Medicine

## 2014-02-12 DIAGNOSIS — I82409 Acute embolism and thrombosis of unspecified deep veins of unspecified lower extremity: Secondary | ICD-10-CM

## 2014-02-12 NOTE — Assessment & Plan Note (Addendum)
I called patient over the phone and discussed coumadin therapy in the setting of first time unprovoked DVT. She denies any symptoms presently. She has been compliant with Coumadin and INR has been within normal ranges since initiation of therapy in 07/04/2013. I discussed the risks and benefits of continuing with Coumadin therapy after standard treatment with 6 months of anticoagulation. I cautioned the patient about risk factors for DVT including cigarette smoking (including secondhand smoke), oral contraceptive pills, hormonal contraceptive, over-the-counter supplements, and the other prescription medications. She informed me that her husband is a smoker. I advised her to request him to always smoke from the outside in order to avoid having secondhand smoke. I informed her that she is at an increased risk of DVT or PE (10% over the next one year and 30% over the next 5 years according to literature from up to date). Discussed the case with Dr Rogelia BogaButcher. Most experts recommend indefinite anticoagulation in this setting (unprovoked proximal DVT or PE). Discussed with the patient about risks and benefits of continued anticoagulation. Patient is agreeable with continued anticoagulation. In the future we will consider NOACs if cost is not as issues however, I indicated to her that these medications' long-term safety has not been well established. She will follow up with on 04/09/2014.

## 2014-03-03 ENCOUNTER — Ambulatory Visit (INDEPENDENT_AMBULATORY_CARE_PROVIDER_SITE_OTHER): Payer: No Typology Code available for payment source | Admitting: Pharmacist

## 2014-03-03 DIAGNOSIS — I2699 Other pulmonary embolism without acute cor pulmonale: Secondary | ICD-10-CM

## 2014-03-03 DIAGNOSIS — Z7901 Long term (current) use of anticoagulants: Secondary | ICD-10-CM

## 2014-03-03 DIAGNOSIS — I82409 Acute embolism and thrombosis of unspecified deep veins of unspecified lower extremity: Secondary | ICD-10-CM

## 2014-03-03 LAB — POCT INR: INR: 2.1

## 2014-03-03 MED ORDER — WARFARIN SODIUM 5 MG PO TABS: ORAL_TABLET | ORAL | Status: DC

## 2014-03-03 NOTE — Patient Instructions (Signed)
Patient instructed to take medications as defined in the Anti-coagulation Track section of this encounter.  Patient instructed to take today's dose.  Patient verbalized understanding of these instructions.    

## 2014-03-03 NOTE — Progress Notes (Signed)
Anti-Coagulation Progress Note  Kara Mcclain is a 43 y.o. female who is currently on an anti-coagulation regimen.    RECENT RESULTS: Recent results are below, the most recent result is correlated with a dose of 57.5 mg. per week: Lab Results  Component Value Date   INR 2.10 03/03/2014   INR 1.40 02/10/2014   INR 3.50 01/20/2014    ANTI-COAG DOSE: Anticoagulation Dose Instructions as of 03/03/2014     Glynis SmilesSun Mon Tue Wed Thu Fri Sat   New Dose 7.5 mg 10 mg 7.5 mg 10 mg 7.5 mg 10 mg 7.5 mg       ANTICOAG SUMMARY: Anticoagulation Episode Summary   Current INR goal 2.0-3.0  Next INR check 03/31/2014  INR from last check 2.10 (03/03/2014)  Weekly max dose   Target end date   [facility]  Preferred lab   Send INR reminders to    Indications  Acute DVT (deep venous thrombosis) [453.40] Acute pulmonary embolism [415.19] Long term (current) use of anticoagulants [V58.61]        Comments         ANTICOAG TODAY: Anticoagulation Summary as of 03/03/2014   INR goal 2.0-3.0  Selected INR 2.10 (03/03/2014)  Next INR check 03/31/2014  Target end date    Indications  Acute DVT (deep venous thrombosis) [453.40] Acute pulmonary embolism [415.19] Long term (current) use of anticoagulants [V58.61]      Anticoagulation Episode Summary   [facility]   Preferred lab    Send INR reminders to    Comments       PATIENT INSTRUCTIONS: Patient Instructions  Patient instructed to take medications as defined in the Anti-coagulation Track section of this encounter.  Patient instructed to take today's dose.  Patient verbalized understanding of these instructions.       FOLLOW-UP Return in 4 weeks (on 03/31/2014) for Follow up INR at 1130h.  Hulen LusterJames Demontae Antunes, III Pharm.D., CACP

## 2014-03-31 ENCOUNTER — Ambulatory Visit: Payer: No Typology Code available for payment source

## 2014-04-09 ENCOUNTER — Encounter: Payer: No Typology Code available for payment source | Admitting: Internal Medicine

## 2014-04-09 ENCOUNTER — Encounter: Payer: Self-pay | Admitting: Internal Medicine

## 2014-04-30 ENCOUNTER — Ambulatory Visit (INDEPENDENT_AMBULATORY_CARE_PROVIDER_SITE_OTHER): Payer: Self-pay | Admitting: Internal Medicine

## 2014-04-30 ENCOUNTER — Encounter: Payer: Self-pay | Admitting: Internal Medicine

## 2014-04-30 VITALS — BP 124/79 | HR 73 | Temp 98.4°F | Ht 69.5 in | Wt 238.8 lb

## 2014-04-30 DIAGNOSIS — Z7901 Long term (current) use of anticoagulants: Secondary | ICD-10-CM

## 2014-04-30 DIAGNOSIS — Z86718 Personal history of other venous thrombosis and embolism: Secondary | ICD-10-CM

## 2014-04-30 DIAGNOSIS — Z86711 Personal history of pulmonary embolism: Secondary | ICD-10-CM

## 2014-04-30 DIAGNOSIS — Z0000 Encounter for general adult medical examination without abnormal findings: Secondary | ICD-10-CM

## 2014-04-30 LAB — POCT INR: INR: 2.4

## 2014-04-30 NOTE — Progress Notes (Signed)
Attending physician note: Presenting problems, physical findings, medications, reviewed with resident physician Dr. Dow Adolph  and I concur with this management plan. Cephas Darby, MD, FACP  Hematology-Oncology/Internal Medicine

## 2014-04-30 NOTE — Assessment & Plan Note (Addendum)
I again discussed coumadin therapy in the setting of first time unprovoked DVT with PE. She denies any symptoms presently. She has been compliant with Coumadin and INR has been within therapeutic ranges since initiation of therapy in 07/04/2013. I discussed the risks and benefits of continuing with Coumadin therapy after a standard treatment with 6 months of anticoagulation. The recommendation will be to continue with indefinite oral anticoagulation with periodic reassessment of benefits versus risks. We discussed risk factors for DVT including cigarette smoking (including secondhand smoke), hormonal contraceptive, over-the-counter supplements, and the other prescription medications. I reemphasized her that she is at an increased risk of DVT or PE (10% over the next one year and 30% over the next 5 years according to literature from up to date). I discussed the case with Dr Cyndie Chime. I also addressed the issue of Coumadin compared to NOACS like Xarelto and discussed with the advantages and disadvantages of these agents compare to coumadin. She is currently happy with her Coumadin therapy and does not wish to switch to a newer agent.  Plan. -Continue with Coumadin. Will periodically be reevaluated by Dr. Alexandria Lodge - Check INR today is 2.4. She will continue with her current dose -Followup monthly for INR checks. - I will see her in 6 -12 months for reassessment of her benefits and risks of continuing with anticoagulants

## 2014-04-30 NOTE — Patient Instructions (Signed)
It is nice seeing you again! Please continue with your coumadin as before I will see you again in 6 months but you are welcome to come back to see me anytime if needed.

## 2014-04-30 NOTE — Progress Notes (Signed)
Subjective:   HPI: Kara Mcclain is a 43 y.o. AA woman with past medical history of unprovoked proximal left lower extremity DVT, accompanied by pulmonary embolism in August 2014 presents for routine followup visit to discuss her anticoagulation therapy. She is on indefinite anticoagulation with coumadin with periodical reassessments for risks versus benefit.   She has no complaints today. She is been very compliant with her Coumadin therapy with INR within the therapeutic range for more than 95% of the time.  Kindly see the A&P for the status of the pt's chronic medical problems.  ROS: Constitutional: Denies fever, chills, diaphoresis, appetite change and fatigue.  Respiratory: Denies SOB, DOE, cough, chest tightness, and wheezing. Denies chest pain. CVS: No chest pain, palpitations and leg swelling.  GI: No abdominal pain, nausea, vomiting, bloody stools GU: No dysuria, frequency, hematuria, or flank pain.  MSK: No myalgias, back pain, joint swelling, arthralgias  Psych: No depression symptoms. No SI or SA.   Past Medical History  Diagnosis Date  . Pulmonary embolism   . DVT (deep venous thrombosis)     "LLE" (07/04/2013)  . Chest pain     2/2 related to PE with component of anxiety versus pericarditis  Improves with tylenol prn a few times a week EKG with long QTc and nonspecific t wave changes. 2d echo 08/23/2013 - normal  08/26/2013 referred to cards for evaluation Lipid check ordered.    Current Outpatient Prescriptions  Medication Sig Dispense Refill  . acetaminophen (TYLENOL) 325 MG tablet Take 650 mg by mouth every 6 (six) hours as needed. Takes twice a week 2 tabs      . warfarin (COUMADIN) 5 MG tablet Take 1&1/2 tablets daily EXCEPT on Mondays, Wednesdays, Fridays--take 2 tablets.  50 tablet  2   No current facility-administered medications for this visit.   Family History  Problem Relation Age of Onset  . Pulmonary embolism Mother   . Deep vein thrombosis Mother    . Cancer Mother   . Hypertension Mother   . Stroke Father   . Gout Sister   . Diabetes Sister   . Hypertension Sister    History   Social History  . Marital Status: Married    Spouse Name: N/A    Number of Children: N/A  . Years of Education: N/A   Social History Main Topics  . Smoking status: Never Smoker   . Smokeless tobacco: Never Used  . Alcohol Use: 1.8 oz/week    3 Shots of liquor per week     Comment: sometimes  . Drug Use: No     Comment: 07/04/2013 "last marijuana ~ 3 wks ago"  . Sexual Activity: Yes    Birth Control/ Protection: None   Other Topics Concern  . None   Social History Narrative  . None    Objective:  Physical Exam: Filed Vitals:   04/30/14 1350  BP: 124/79  Pulse: 73  Temp: 98.4 F (36.9 C)  Height: 5' 9.5" (1.765 m)  Weight: 238 lb 12.8 oz (108.319 kg)  SpO2: 100%   General: Well nourished. No acute distress.  HEENT: Normal oral mucosa. MMM.  Lungs: CTA bilaterally. Heart: RRR; no extra sounds or murmurs  Abdomen: Non-distended, normal BS, soft, nontender; no hepatosplenomegaly  Extremities: No pedal edema. No joint swelling or tenderness. Neurologic: Normal EOM,  Alert and oriented x3. No obvious neurologic/cranial nerve deficits.  Assessment & Plan:  I have discussed my assessment and plan  with  my attending  in the clinic, Dr. Cyndie Chime  as detailed under problem based charting.

## 2014-04-30 NOTE — Progress Notes (Signed)
Attending physician note: Presenting complaints, physical findings, medications, reviewed with resident physician Dr. Dow Adolph and I concur with his management. Cephas Darby, MD, FACP  Hematology-Oncology/Internal Medicine

## 2014-05-01 LAB — LIPID PANEL
CHOLESTEROL: 168 mg/dL (ref 0–200)
HDL: 45 mg/dL (ref >39)
LDL CALC: 113 mg/dL — AB (ref 0–99)
Total CHOL/HDL Ratio: 3.7 Ratio
Triglycerides: 52 mg/dL (ref <150)
VLDL: 10 mg/dL (ref 0–40)

## 2014-05-21 ENCOUNTER — Other Ambulatory Visit: Payer: Self-pay | Admitting: Internal Medicine

## 2014-06-02 ENCOUNTER — Ambulatory Visit (INDEPENDENT_AMBULATORY_CARE_PROVIDER_SITE_OTHER): Payer: Self-pay | Admitting: Pharmacist

## 2014-06-02 DIAGNOSIS — I2699 Other pulmonary embolism without acute cor pulmonale: Secondary | ICD-10-CM

## 2014-06-02 DIAGNOSIS — Z86718 Personal history of other venous thrombosis and embolism: Secondary | ICD-10-CM

## 2014-06-02 DIAGNOSIS — Z86711 Personal history of pulmonary embolism: Secondary | ICD-10-CM

## 2014-06-02 DIAGNOSIS — I82409 Acute embolism and thrombosis of unspecified deep veins of unspecified lower extremity: Secondary | ICD-10-CM

## 2014-06-02 DIAGNOSIS — Z7901 Long term (current) use of anticoagulants: Secondary | ICD-10-CM

## 2014-06-02 LAB — POCT INR: INR: 2.1

## 2014-06-02 NOTE — Patient Instructions (Signed)
Patient instructed to take medications as defined in the Anti-coagulation Track section of this encounter.  Patient instructed to take today's dose.  Patient verbalized understanding of these instructions.    

## 2014-06-02 NOTE — Progress Notes (Signed)
Anti-Coagulation Progress Note  Kara Mcclain is a 43 y.o. female who is currently on an anti-coagulation regimen.    RECENT RESULTS: Recent results are below, the most recent result is correlated with a dose of 60 mg. per week:  In August 2015 she will have completed one year of warfarin due to VTE with no known provoking cause. I note that Dr. Myna HidalgoEnnever had ordered for October 2015 a repeat D-dimer. Not sure if the intent was for her to be OFF warfarin prior to or around this repeat D-dimer or not. In review of all of her INR values--she has had 18 values recorded here by me. Of these 18, 16 of them have been in range (2.0 - 3.0) or even supra-therapeutic (slightly, e.g. 3.1, 3.3 etc.) Only TWO values have been sub-therapeutic, i.e. 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 This represents 88.8% time in target range for her indication of VTE. This information may help in the clinical decision of "equipoise", balancing continued benefit versus risks of staying on warfarin.  Lab Results  Component Value Date   INR 2.10 06/02/2014   INR 2.4 04/30/2014   INR 2.10 03/03/2014    ANTI-COAG DOSE: Anticoagulation Dose Instructions as of 06/02/2014     Glynis SmilesSun Mon Tue Wed Thu Fri Sat   New Dose 7.5 mg 10 mg 7.5 mg 10 mg 7.5 mg 10 mg 7.5 mg       ANTICOAG SUMMARY: Anticoagulation Episode Summary   Current INR goal 2.0-3.0  Next INR check 06/30/2014  INR from last check 2.10 (06/02/2014)  Weekly max dose   Target end date   [facility]  Preferred lab   Send INR reminders to    Indications  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 [V12.55] Long term (current) use of anticoagulants [V58.61]        Comments         ANTICOAG TODAY: Anticoagulation Summary as of 06/02/2014   INR goal 2.0-3.0  Selected INR 2.10 (06/02/2014)  Next INR check 06/30/2014  Target end date    Indications  H/o DVT and PE 06/2013 [V12.51] H/o  Pulmonary Embolism. [V12.55] Long term (current) use of anticoagulants [V58.61]      Anticoagulation Episode Summary   [facility] check [facility]   Preferred lab    Send INR reminders to    Comments       PATIENT INSTRUCTIONS: Patient Instructions  Patient instructed to take medications as defined in the Anti-coagulation Track section of this encounter.  Patient instructed to take today's dose.  Patient verbalized understanding of these instructions.       FOLLOW-UP Return in 4 weeks (on 06/30/2014) for Follow up INR at 1045h.  Hulen LusterJames Karter Haire, III Pharm.D., CACP

## 2014-06-30 ENCOUNTER — Ambulatory Visit (INDEPENDENT_AMBULATORY_CARE_PROVIDER_SITE_OTHER): Payer: Self-pay | Admitting: Pharmacist

## 2014-06-30 DIAGNOSIS — Z86718 Personal history of other venous thrombosis and embolism: Secondary | ICD-10-CM

## 2014-06-30 DIAGNOSIS — I82409 Acute embolism and thrombosis of unspecified deep veins of unspecified lower extremity: Secondary | ICD-10-CM

## 2014-06-30 DIAGNOSIS — Z86711 Personal history of pulmonary embolism: Secondary | ICD-10-CM

## 2014-06-30 DIAGNOSIS — Z7901 Long term (current) use of anticoagulants: Secondary | ICD-10-CM

## 2014-06-30 DIAGNOSIS — I2699 Other pulmonary embolism without acute cor pulmonale: Secondary | ICD-10-CM

## 2014-06-30 LAB — POCT INR: INR: 2.1

## 2014-06-30 MED ORDER — WARFARIN SODIUM 5 MG PO TABS: ORAL_TABLET | ORAL | Status: DC

## 2014-06-30 NOTE — Progress Notes (Signed)
Anti-Coagulation Progress Note  Kara Mcclain is a 43 y.o. female who is currently on an anti-coagulation regimen.    RECENT RESULTS: Recent results are below, the most recent result is correlated with a dose of 60 mg. 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 [V12.55] Long term (current) use of anticoagulants [V58.61]      Anticoagulation Episode Summary   [facility]   Preferred lab    Send INR reminders to    Comments       PATIENT INSTRUCTIONS: Patient Instructions  Patient instructed to take medications as defined in the Anti-coagulation Track section of this encounter.  Patient instructed to take today's dose.  Patient verbalized understanding of these instructions.       FOLLOW-UP Return in 3 weeks (on 07/21/2014) for Follow up INR at 1115h.  Hulen LusterJames Dailen Mcclish, III Pharm.D., CACP

## 2014-06-30 NOTE — Patient Instructions (Signed)
Patient instructed to take medications as defined in the Anti-coagulation Track section of this encounter.  Patient instructed to take today's dose.  Patient verbalized understanding of these instructions.    

## 2014-07-02 ENCOUNTER — Telehealth (HOSPITAL_COMMUNITY): Payer: Self-pay | Admitting: Pharmacist

## 2014-07-21 ENCOUNTER — Ambulatory Visit (INDEPENDENT_AMBULATORY_CARE_PROVIDER_SITE_OTHER): Payer: Self-pay | Admitting: Pharmacist

## 2014-07-21 DIAGNOSIS — Z86711 Personal history of pulmonary embolism: Secondary | ICD-10-CM

## 2014-07-21 DIAGNOSIS — Z7901 Long term (current) use of anticoagulants: Secondary | ICD-10-CM

## 2014-07-21 DIAGNOSIS — Z86718 Personal history of other venous thrombosis and embolism: Secondary | ICD-10-CM

## 2014-07-21 LAB — POCT INR: INR: 2.1

## 2014-07-21 NOTE — Patient Instructions (Signed)
Patient instructed to take medications as defined in the Anti-coagulation Track section of this encounter.  Patient instructed to take today's dose.  Patient verbalized understanding of these instructions.    

## 2014-07-21 NOTE — Progress Notes (Signed)
Anti-Coagulation Progress Note  Kara Mcclain is a 43 y.o. female who is currently on an anti-coagulation regimen.    RECENT RESULTS: Recent results are below, the most recent result is correlated with a dose of 55 mg. per week (was supposed to have been /wk but she missed last Friday's  scheduled dose): Lab Results  Component Value Date   INR 2.10 07/21/2014   INR 2.10 06/30/2014   INR 2.10 06/02/2014    ANTI-COAG DOSE: Anticoagulation Dose Instructions as of 07/21/2014     [name] Tue Wed Thu Fri Sat   New Dose 10 mg 7.5 mg 10 mg 10 mg 7.5 mg 10 mg 10 mg       ANTICOAG SUMMARY: Anticoagulation Episode Summary   Current INR goal 2.0-3.0  Next INR check 08/18/2014  INR from last check 2.10 (07/21/2014)  Weekly max dose   Target end date   [facility]  Preferred lab   Send INR reminders to    Indications  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 [V12.55] Long term (current) use of anticoagulants [V58.61]      Anticoagulation Episode Summary   [facility] check location [facility]   Preferred lab    Send INR reminders to    Comments       PATIENT INSTRUCTIONS: Patient Instructions  Patient instructed to take medications as defined in the Anti-coagulation Track section of this encounter.  Patient instructed to take today's dose.  Patient verbalized understanding of these instructions.       FOLLOW-UP Return in 4 weeks (on 08/18/2014) for Follow up INR at 1130h.  [name], III Pharm.D., CACP

## 2014-07-22 NOTE — Progress Notes (Signed)
Patient with history of unprovoked clots, on lifelong anticoagulation.  Her INR was 2.1.  I have reviewed Dr. Saralyn Pilar note.

## 2014-07-29 ENCOUNTER — Other Ambulatory Visit: Payer: Self-pay | Admitting: Internal Medicine

## 2014-08-18 ENCOUNTER — Ambulatory Visit: Payer: Self-pay

## 2014-08-18 ENCOUNTER — Ambulatory Visit (INDEPENDENT_AMBULATORY_CARE_PROVIDER_SITE_OTHER): Payer: Self-pay | Admitting: Pharmacist

## 2014-08-18 DIAGNOSIS — Z86711 Personal history of pulmonary embolism: Secondary | ICD-10-CM

## 2014-08-18 DIAGNOSIS — Z7901 Long term (current) use of anticoagulants: Secondary | ICD-10-CM

## 2014-08-18 DIAGNOSIS — Z86718 Personal history of other venous thrombosis and embolism: Secondary | ICD-10-CM

## 2014-08-18 LAB — POCT INR: INR: 2

## 2014-08-18 MED ORDER — WARFARIN SODIUM 5 MG PO TABS: ORAL_TABLET | ORAL | Status: DC

## 2014-08-18 NOTE — Progress Notes (Signed)
Anti-Coagulation Progress Note  Kara Mcclain is a 43 y.o. female who is currently on an anti-coagulation regimen.    RECENT RESULTS: Recent results are below, the most recent result is correlated with a dose of 65 mg. 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 [V12.55] Long term (current) use of anticoagulants [V58.61]      Anticoagulation Episode Summary   [facility] check [facility]   Preferred lab    Send INR reminders to    Comments       PATIENT INSTRUCTIONS: Patient Instructions  Patient instructed to take medications as defined in the Anti-coagulation Track section of this encounter.  Patient instructed to take today's dose.  Patient verbalized understanding of these instructions.       FOLLOW-UP Return in 4 weeks (on 09/15/2014) for Follow up INR at 1130h.  [name], III Pharm.D., CACP

## 2014-08-18 NOTE — Patient Instructions (Signed)
Patient instructed to take medications as defined in the Anti-coagulation Track section of this encounter.  Patient instructed to take today's dose.  Patient verbalized understanding of these instructions.    

## 2014-08-18 NOTE — Progress Notes (Signed)
INTERNAL MEDICINE TEACHING ATTENDING ADDENDUM - Earl Lagos M.D  Duration- indefinite, Indication- DVT/PE, INR- therapeutic. Agree with Dr. Saralyn Pilar recommendations as outlined in his note.

## 2014-09-15 ENCOUNTER — Encounter: Payer: Self-pay | Admitting: Internal Medicine

## 2014-09-15 ENCOUNTER — Ambulatory Visit: Payer: Self-pay

## 2014-11-10 ENCOUNTER — Inpatient Hospital Stay (HOSPITAL_COMMUNITY)
Admission: EM | Admit: 2014-11-10 | Discharge: 2014-11-13 | DRG: 777 | Disposition: A | Discharge status: Home / Self Care | Payer: Medicaid Other | Attending: Internal Medicine | Admitting: Internal Medicine

## 2014-11-10 ENCOUNTER — Emergency Department (HOSPITAL_COMMUNITY): Payer: Medicaid Other

## 2014-11-10 ENCOUNTER — Inpatient Hospital Stay (HOSPITAL_COMMUNITY): Payer: Medicaid Other

## 2014-11-10 ENCOUNTER — Telehealth: Payer: Self-pay | Admitting: *Deleted

## 2014-11-10 ENCOUNTER — Encounter (HOSPITAL_COMMUNITY): Payer: Self-pay | Admitting: Family Medicine

## 2014-11-10 DIAGNOSIS — I824Z1 Acute embolism and thrombosis of unspecified deep veins of right distal lower extremity: Secondary | ICD-10-CM | POA: Diagnosis present

## 2014-11-10 DIAGNOSIS — O009 Unspecified ectopic pregnancy without intrauterine pregnancy: Secondary | ICD-10-CM | POA: Diagnosis present

## 2014-11-10 DIAGNOSIS — Z86711 Personal history of pulmonary embolism: Secondary | ICD-10-CM

## 2014-11-10 DIAGNOSIS — I2699 Other pulmonary embolism without acute cor pulmonale: Secondary | ICD-10-CM | POA: Diagnosis present

## 2014-11-10 DIAGNOSIS — O2230 Deep phlebothrombosis in pregnancy, unspecified trimester: Secondary | ICD-10-CM | POA: Diagnosis present

## 2014-11-10 DIAGNOSIS — O09519 Supervision of elderly primigravida, unspecified trimester: Secondary | ICD-10-CM | POA: Diagnosis not present

## 2014-11-10 DIAGNOSIS — Z86718 Personal history of other venous thrombosis and embolism: Secondary | ICD-10-CM

## 2014-11-10 DIAGNOSIS — Z3490 Encounter for supervision of normal pregnancy, unspecified, unspecified trimester: Secondary | ICD-10-CM

## 2014-11-10 DIAGNOSIS — Z7901 Long term (current) use of anticoagulants: Secondary | ICD-10-CM

## 2014-11-10 DIAGNOSIS — M79609 Pain in unspecified limb: Secondary | ICD-10-CM

## 2014-11-10 DIAGNOSIS — Z3A00 Weeks of gestation of pregnancy not specified: Secondary | ICD-10-CM | POA: Diagnosis present

## 2014-11-10 DIAGNOSIS — K089 Disorder of teeth and supporting structures, unspecified: Secondary | ICD-10-CM

## 2014-11-10 DIAGNOSIS — I82401 Acute embolism and thrombosis of unspecified deep veins of right lower extremity: Secondary | ICD-10-CM

## 2014-11-10 DIAGNOSIS — O88819 Other embolism in pregnancy, unspecified trimester: Secondary | ICD-10-CM | POA: Diagnosis not present

## 2014-11-10 DIAGNOSIS — R0602 Shortness of breath: Secondary | ICD-10-CM

## 2014-11-10 LAB — CBC WITH DIFFERENTIAL/PLATELET
Basophils Absolute: 0 10*3/uL (ref 0.0–0.1)
Basophils Relative: 0 % (ref 0–1)
EOS ABS: 0.2 10*3/uL (ref 0.0–0.7)
Eosinophils Relative: 3 % (ref 0–5)
HEMATOCRIT: 33 % — AB (ref 36.0–46.0)
HEMOGLOBIN: 11 g/dL — AB (ref 12.0–15.0)
LYMPHS ABS: 1.5 10*3/uL (ref 0.7–4.0)
Lymphocytes Relative: 25 % (ref 12–46)
MCH: 27.8 pg (ref 26.0–34.0)
MCHC: 33.3 g/dL (ref 30.0–36.0)
MCV: 83.3 fL (ref 78.0–100.0)
Monocytes Absolute: 0.6 10*3/uL (ref 0.1–1.0)
Monocytes Relative: 10 % (ref 3–12)
NEUTROS PCT: 62 % (ref 43–77)
Neutro Abs: 3.6 10*3/uL (ref 1.7–7.7)
Platelets: 227 10*3/uL (ref 150–400)
RBC: 3.96 MIL/uL (ref 3.87–5.11)
RDW: 14 % (ref 11.5–15.5)
WBC: 5.8 10*3/uL (ref 4.0–10.5)

## 2014-11-10 LAB — URINALYSIS, ROUTINE W REFLEX MICROSCOPIC
BILIRUBIN URINE: NEGATIVE
GLUCOSE, UA: NEGATIVE mg/dL
Hgb urine dipstick: NEGATIVE
KETONES UR: 15 mg/dL — AB
Leukocytes, UA: NEGATIVE
Nitrite: NEGATIVE
PH: 6.5 (ref 5.0–8.0)
Protein, ur: NEGATIVE mg/dL
Specific Gravity, Urine: 1.028 (ref 1.005–1.030)
Urobilinogen, UA: 1 mg/dL (ref 0.0–1.0)

## 2014-11-10 LAB — PREPARE RBC (CROSSMATCH)

## 2014-11-10 LAB — HEPATIC FUNCTION PANEL
ALBUMIN: 3.9 g/dL (ref 3.5–5.2)
ALK PHOS: 80 U/L (ref 39–117)
ALT: 8 U/L (ref 0–35)
AST: 13 U/L (ref 0–37)
Bilirubin, Direct: <0.2 mg/dL (ref 0.0–0.3)
Total Bilirubin: 0.4 mg/dL (ref 0.3–1.2)
Total Protein: 7.6 g/dL (ref 6.0–8.3)

## 2014-11-10 LAB — BASIC METABOLIC PANEL
Anion gap: 13 (ref 5–15)
BUN: 9 mg/dL (ref 6–23)
CO2: 22 mEq/L (ref 19–32)
Calcium: 9.1 mg/dL (ref 8.4–10.5)
Chloride: 104 mEq/L (ref 96–112)
Creatinine, Ser: 0.52 mg/dL (ref 0.50–1.10)
GFR calc Af Amer: >90 mL/min (ref >90)
GFR calc non Af Amer: >90 mL/min (ref >90)
Glucose, Bld: 98 mg/dL (ref 70–99)
POTASSIUM: 4.1 meq/L (ref 3.7–5.3)
Sodium: 139 mEq/L (ref 137–147)

## 2014-11-10 LAB — HCG, QUANTITATIVE, PREGNANCY: HCG, BETA CHAIN, QUANT, S: 1530 m[IU]/mL — AB (ref <5)

## 2014-11-10 LAB — PROTIME-INR
INR: 1.41 (ref 0.00–1.49)
PROTHROMBIN TIME: 17.4 s — AB (ref 11.6–15.2)

## 2014-11-10 LAB — AST: AST: 10 U/L (ref 0–37)

## 2014-11-10 LAB — ABO/RH: ABO/RH(D): A POS

## 2014-11-10 LAB — POC URINE PREG, ED: Preg Test, Ur: POSITIVE — AB

## 2014-11-10 LAB — I-STAT TROPONIN, ED: TROPONIN I, POC: 0 ng/mL (ref 0.00–0.08)

## 2014-11-10 MED ORDER — SODIUM CHLORIDE 0.9 % IV SOLN: Freq: Once | INTRAVENOUS | Status: DC

## 2014-11-10 MED ORDER — ACETAMINOPHEN 500 MG PO TABS
500.0000 mg | ORAL_TABLET | Freq: Once | ORAL | Status: AC
  Administered 2014-11-10: 500 mg via ORAL
  Filled 2014-11-10: qty 1

## 2014-11-10 MED ORDER — ENOXAPARIN (LOVENOX) PATIENT EDUCATION KIT
PACK | Freq: Once | Status: DC
  Filled 2014-11-10: qty 1

## 2014-11-10 MED ORDER — ENOXAPARIN SODIUM 120 MG/0.8ML ~~LOC~~ SOLN
1.0000 mg/kg | Freq: Two times a day (BID) | SUBCUTANEOUS | Status: DC
  Administered 2014-11-10: 105 mg via SUBCUTANEOUS
  Filled 2014-11-10 (×2): qty 0.8

## 2014-11-10 MED ORDER — METHOTREXATE INJECTION FOR WOMEN'S HOSPITAL
50.0000 mg/m2 | Freq: Once | INTRAMUSCULAR | Status: AC
  Administered 2014-11-11: 115 mg via INTRAMUSCULAR
  Filled 2014-11-10: qty 2.3

## 2014-11-10 MED ORDER — HEPARIN (PORCINE) IN NACL 100-0.45 UNIT/ML-% IJ SOLN
1500.0000 [IU]/h | INTRAMUSCULAR | Status: DC
  Administered 2014-11-11 – 2014-11-12 (×3): 1500 [IU]/h via INTRAVENOUS
  Filled 2014-11-10 (×7): qty 250

## 2014-11-10 MED ORDER — ACETAMINOPHEN 325 MG PO TABS
650.0000 mg | ORAL_TABLET | Freq: Once | ORAL | Status: AC
  Administered 2014-11-10: 650 mg via ORAL
  Filled 2014-11-10: qty 2

## 2014-11-10 MED ORDER — IOHEXOL 350 MG/ML SOLN
100.0000 mL | Freq: Once | INTRAVENOUS | Status: AC | PRN
  Administered 2014-11-10: 100 mL via INTRAVENOUS

## 2014-11-10 NOTE — Telephone Encounter (Signed)
Agree with ED evaluation. 

## 2014-11-10 NOTE — H&P (Signed)
Date: 11/10/2014               Patient Name:  Kara Mcclain MRN: [id number]  DOB: [birth year] Age / Sex: 43 y.o., female   PCP: [name], MD         Medical Service: Internal Medicine Teaching Service         Attending Physician: Dr. Heide SparkNarendra    First Contact: Dr. Loma NewtonNgo Pager: [phone]  Second Contact: Dr. Andrey CampanileWilson Pager: [phone]       After Hours (After 5p/  First Contact Pager: [phone]  weekends / holidays): Second Contact Pager: [phone]   Chief Complaint: Leg cramps  History of Present Illness: [name] is a 43yo woman w/ history of DVT/PE in 2014 on coumadin who presents to the ED with leg cramps. Patient reports her leg cramps were similar to when she had a DVT in her left leg which worried her so she came to the ED. She notes she started having leg cramps on Saturday (11/08/14) behind her right knee and calf. She denies any leg swelling or redness. She then had shortness of breath yesterday, but none today. She denies chest pain, palpitations, and cough. She notes she missed 1 dose of her coumadin last week and 1 other dose the week prior to that, but otherwise has been very compliant with her coumadin. She denies any recent long car trips or flights.   In the ED, a right lower extremity doppler showed an acute DVT in the popliteal and posterior tibial veins. Patient was also found to have a positive pregnancy test. CTA was performed which showed bilateral pulmonary embolisms. Transvaginal ultrasound showed a right adnexal ectopic pregnancy with no cardiac activity and no free fluid in the pelvis. Patient was not aware that she was pregnant prior to hospitalization. She has never been pregnant previously despite she and her husband trying for many years. She denies any abdominal pain, nausea, vomiting. Her last menstrual period was on 12/15 and was normal.   Meds: No current facility-administered medications for this encounter.   Current Outpatient Prescriptions    Medication Sig Dispense Refill  . acetaminophen (TYLENOL) 325 MG tablet Take 650 mg by mouth every 6 (six) hours as needed. Takes twice a week 2 tabs    . warfarin (COUMADIN) 5 MG tablet Take 2 tablets daily except on Wednesdays--take 2&1/2 tablets. 60 tablet 1    Past Medical History  Diagnosis Date  . Pulmonary embolism   . DVT (deep venous thrombosis)     "LLE" (07/04/2013)  . Chest pain     2/2 related to PE with component of anxiety versus pericarditis  Improves with tylenol prn a few times a week EKG with long QTc and nonspecific t wave changes. 2d echo 08/23/2013 - normal  08/26/2013 referred to cards for evaluation Lipid check ordered.    Past Surgical History  Procedure Laterality Date  . No past surgeries      Allergies: Allergies as of 11/10/2014  . (No Known Allergies)   Family History  Problem Relation Age of Onset  . Pulmonary embolism Mother   . Deep vein thrombosis Mother   . Cancer Mother   . Hypertension Mother   . Stroke Father   . Gout Sister   . Diabetes Sister   . Hypertension Sister    History   Social History  . Marital Status: Married    Spouse Name: N/A    Number of Children: N/A  . Years of  Education: N/A   Occupational History  . Not on file.   Social History Main Topics  . Smoking status: Never Smoker   . Smokeless tobacco: Never Used  . Alcohol Use: 1.8 oz/week    3 Shots of liquor per week     Comment: sometimes  . Drug Use: No     Comment: 07/04/2013 "last marijuana ~ 3 wks ago"  . Sexual Activity: Yes    Birth Control/ Protection: None   Other Topics Concern  . Not on file   Social History Narrative    Review of Systems: All pertinent ROS as stated in HPI.   Physical Exam: Blood pressure 129/75, pulse 76, resp. rate 16, last menstrual period 11/03/2014, SpO2 100 %. General: alert, sitting up in bed, anxious HEENT: [place]/AT, PERRL, EOMI, no scleral icterus, mucus membranes moist Cardiac: RRR, normal S1/S2, no m/g/r Pulm:  CTA bilaterally, breaths non-labored Abd: BS+, soft, nontender, non-distended, no guarding or rebound Ext: warm and well perfused, distal pulses 2+. Lower extremities appear symmetric with no redness or edema. There is mild tenderness to palpation of right calf.   Neuro: alert and oriented X 3, cranial nerves II-XII grossly intact    Lab results: Basic Metabolic Panel:  Recent Labs  96/02/5411/21/15 1220  NA 139  K 4.1  CL 104  CO2 22  GLUCOSE 98  BUN 9  CREATININE 0.52  CALCIUM 9.1   Liver Function Tests: No results for input(s): AST, ALT, ALKPHOS, BILITOT, PROT, ALBUMIN in the last 72 hours. No results for input(s): LIPASE, AMYLASE in the last 72 hours. No results for input(s): AMMONIA in the last 72 hours. CBC:  Recent Labs  11/10/14 1220  WBC 5.8  NEUTROABS 3.6  HGB 11.0*  HCT 33.0*  MCV 83.3  PLT 227   Cardiac Enzymes: No results for input(s): CKTOTAL, CKMB, CKMBINDEX, TROPONINI in the last 72 hours. BNP: No results for input(s): PROBNP in the last 72 hours. D-Dimer: No results for input(s): DDIMER in the last 72 hours. CBG: No results for input(s): GLUCAP in the last 72 hours. Hemoglobin A1C: No results for input(s): HGBA1C in the last 72 hours. Fasting Lipid Panel: No results for input(s): CHOL, HDL, LDLCALC, TRIG, CHOLHDL, LDLDIRECT in the last 72 hours. Thyroid Function Tests: No results for input(s): TSH, T4TOTAL, FREET4, T3FREE, THYROIDAB in the last 72 hours. Anemia Panel: No results for input(s): VITAMINB12, FOLATE, FERRITIN, TIBC, IRON, RETICCTPCT in the last 72 hours. Coagulation:  Recent Labs  11/10/14 1220  LABPROT 17.4*  INR 1.41   Urine Drug Screen: Drugs of Abuse     Component Value Date/Time   LABOPIA POSITIVE* 07/05/2013 0814   COCAINSCRNUR NONE DETECTED 07/05/2013 0814   LABBENZ NONE DETECTED 07/05/2013 0814   AMPHETMU NONE DETECTED 07/05/2013 0814   THCU POSITIVE* 07/05/2013 0814   LABBARB NONE DETECTED 07/05/2013 0814      Alcohol Level: No results for input(s): ETH in the last 72 hours. Urinalysis:  Recent Labs  11/10/14 1520  COLORURINE YELLOW  LABSPEC 1.028  PHURINE 6.5  GLUCOSEU NEGATIVE  HGBUR NEGATIVE  BILIRUBINUR NEGATIVE  KETONESUR 15*  PROTEINUR NEGATIVE  UROBILINOGEN 1.0  NITRITE NEGATIVE  LEUKOCYTESUR NEGATIVE     Imaging results:  Dg Chest 2 View  11/10/2014   CLINICAL DATA:  Shortness of breath, history of pulmonary embolus. Right leg pain.  EXAM: CHEST  2 VIEW  COMPARISON:  CT chest angiography 07/04/2013  FINDINGS: The cardiomediastinal contours are normal. The lungs are clear. Pulmonary  vasculature is normal. No consolidation, pleural effusion, or pneumothorax. No acute osseous abnormalities are seen.  IMPRESSION: Clear lungs.   Electronically Signed   By: Rubye Oaks M.D.   On: 11/10/2014 13:19   Ct Angio Chest Pe W/cm &/or Wo Cm  11/10/2014   CLINICAL DATA:  History of pulmonary embolism. Leg cramping. Short of breath.  EXAM: CT ANGIOGRAPHY CHEST WITH CONTRAST  TECHNIQUE: Multidetector CT imaging of the chest was performed using the standard protocol during bolus administration of intravenous contrast. Multiplanar CT image reconstructions and MIPs were obtained to evaluate the vascular anatomy.  CONTRAST:  OMNIPAQUE IOHEXOL 350 MG/ML SOLN  COMPARISON:  07/04/2013  FINDINGS: The study is positive for acute bilateral pulmonary thromboembolism. Thrombus is present in the main right lower lobe lobar pulmonary artery extending into segmental branches. There is a minimal pulmonary embolism in the anterior basal segment of the left lower lobe. See image 129 of series 5. No evidence of left upper lobe, right upper lobe, or right middle lobe pulmonary embolism. Right ventricle to left ventricle ratio is 0.77. There is no evidence of right heart strain.  No abnormal mediastinal adenopathy.  No pneumothorax or pleural effusion.  Low lung volumes was scattered areas of volume loss.  No  acute bony deformity.  Review of the MIP images confirms the above findings.  IMPRESSION: The study is positive for acute pulmonary thromboembolism primarily in the right lower lobe. There is no evidence of right heart strain. Critical Value/emergent results were called by telephone at the time of interpretation on 11/10/2014 at 4:27 pm to Dr. Oswaldo Conroy , who verbally acknowledged these results.   Electronically Signed   By: Maryclare Bean M.D.   On: 11/10/2014 16:28    Other results: EKG: sinus rhythm, no change from prior   Assessment & Plan by Problem: Principal Problem:   Pulmonary embolism Active Problems:   H/o Pulmonary Embolism.   Long term current use of anticoagulant therapy   Ectopic pregnancy   Pregnancy   Acute pulmonary embolism Ms. Spillane is a 43yo woman w/ history of DVT/PE in 2014 on coumadin who presents to the ED with leg cramps found to have an acute DVT in the right lower extremity and bilateral pulmonary embolisms. Patient also found to have a unruptured ectopic pregnancy.  DVT of RLE and Bilateral Pulmonary Embolism: Patient presenting with right lower extremity cramping and with a history of DVT and PE in August 2014, on Coumadin. However, patient is subtherapeutic with an INR of 1.41. She reports she missed 2 doses over the last 2 weeks but has been compliant otherwise. No evidence of right heart strain on chest CTA. Patient found to have ectopic pregnancy (see below), likely contributing to her hypercoaguable state. Will need to monitor abdominal exam closely as she has a high bleeding risk if ectopic pregnancy ruptures.  - IV heparin - CBC in AM  - Tylenol for pain  Ectopic Pregnancy: Patient with positive beta hCG and then subsequently found to have an unruptured right ectopic pregnancy on ultrasound. Vital signs stable and abdominal exam is benign, no evidence of rupture. OBGYN was consulted and recommend that patient is a good candidate for methotrexate therapy  (low beta hCG level, no cardiac activity).  - OBGYN consulted, appreciate recommendations  - Start Methotrexate therapy tonight - IV heparin, in case patient requires emergent surgery for acute rupture -Type and screen, ABO/Rh - CBC in AM - LFTs - Check beta hCG on 12/24 to  make sure level coming down  - Serial abdominal exams to evaluate for complications  Diet: Regular  VTE Prophylaxis: Heparin  Code: Full  Dispo: Disposition is deferred at this time, awaiting improvement of current medical problems. Anticipated discharge in approximately 3-4 day(s).   The patient does have a current PCP Dow Adolph, MD) and does need an [facility] hospital follow-up appointment after discharge.  The patient does not have transportation limitations that hinder transportation to clinic appointments.  Signed: Harold Barban, M.D., Ph.D. Internal Medicine Teaching Service, PGY-1 11/10/2014, 4:45 PM

## 2014-11-10 NOTE — ED Notes (Signed)
Per pt sts right leg pain and cramping. sts hx of blood clots and has missed 2 doses of her coumadin. sts also slight SOB. Hx of PE.

## 2014-11-10 NOTE — ED Notes (Signed)
Report attempt x 1 made 

## 2014-11-10 NOTE — ED Provider Notes (Signed)
CSN: [id number]     Arrival date & time 11/10/14  1156 History   First MD Initiated Contact with Patient 11/10/14 1219     Chief Complaint  Patient presents with  . Leg Pain     (Consider location/radiation/quality/duration/timing/severity/associated sxs/prior Treatment) HPI  Kara Mcclain is a 43 y.o. female with PMH of left DVT as well as PE in 2014 presenting with new onset right leg pain and cramping but no swelling. Patient on Coumadin and missed 2 doses over the past 2 days. She noted she had some shortness of breath. She said it was difficult to catch her breath. This is now resolved. Patient believes it was due to anxiety because it was after the pain in her right leg. She denies any chest pain. Patient denies any fever, URI symptoms. Patient denies any trauma, surgeries, immobilization. Patient states she did drive [place] but stopped every hour to walk. No history of estrogens no personal history of cancer. She denies any hemoptysis. No history of hypertension, high cholesterol levels, family history of MI at young age. Patient does not smoke. She does use marijuana occasionally but denies this recently.   Past Medical History  Diagnosis Date  . Pulmonary embolism   . DVT (deep venous thrombosis)     "LLE" (07/04/2013)  . Chest pain     2/2 related to PE with component of anxiety versus pericarditis  Improves with tylenol prn a few times a week EKG with long QTc and nonspecific t wave changes. 2d echo 08/23/2013 - normal  08/26/2013 referred to cards for evaluation Lipid check ordered.    Past Surgical History  Procedure Laterality Date  . No past surgeries     Family History  Problem Relation Age of Onset  . Pulmonary embolism Mother   . Deep vein thrombosis Mother   . Cancer Mother   . Hypertension Mother   . Stroke Father   . Gout Sister   . Diabetes Sister   . Hypertension Sister    History  Substance Use Topics  . Smoking status: Never Smoker   . Smokeless  tobacco: Never Used  . Alcohol Use: 1.8 oz/week    3 Shots of liquor per week     Comment: sometimes   OB History    Gravida Para Term Preterm AB TAB SAB Ectopic Multiple Living   0              Review of Systems  Constitutional: Negative for fever and chills.  HENT: Negative for congestion and rhinorrhea.   Eyes: Negative for visual disturbance.  Respiratory: Negative for cough and shortness of breath.   Cardiovascular: Negative for chest pain and palpitations.  Gastrointestinal: Negative for nausea, vomiting and diarrhea.  Genitourinary: Negative for dysuria and hematuria.  Musculoskeletal: Negative for back pain and gait problem.  Skin: Negative for rash.  Neurological: Negative for weakness and headaches.      Allergies  Review of patient's allergies indicates no known allergies.  Home Medications   Prior to Admission medications   Medication Sig Start Date End Date Taking? Authorizing Provider  acetaminophen (TYLENOL) 325 MG tablet Take 650 mg by mouth every 6 (six) hours as needed. Takes twice a week 2 tabs   Yes Historical Provider, MD  warfarin (COUMADIN) 5 MG tablet Take 2 tablets daily except on Wednesdays--take 2&1/2 tablets. 08/18/14  Yes Nischal Narendra, MD   BP 129/75 mmHg  Pulse 76  Resp 16  SpO2 100%  LMP  11/03/2014 Physical Exam  Constitutional: She appears well-developed and well-nourished. No distress.  HENT:  Head: Normocephalic and atraumatic.  Eyes: Conjunctivae and EOM are normal. Right eye exhibits no discharge. Left eye exhibits no discharge.  Neck: Normal range of motion. No JVD present.  Cardiovascular: Normal rate, regular rhythm and normal heart sounds.   Patient with right calf tenderness in leg tenderness. No appreciated edema or erythema laterally.  Pulmonary/Chest: Effort normal and breath sounds normal. No respiratory distress. She has no wheezes.  Abdominal: Soft. Bowel sounds are normal. She exhibits no distension. There is no  tenderness.  Neurological: She is alert. She exhibits normal muscle tone. Coordination normal.  Skin: Skin is warm and dry. She is not diaphoretic.  Nursing note and vitals reviewed.   ED Course  Procedures (including critical care time) Labs Review Labs Reviewed  CBC WITH DIFFERENTIAL - Abnormal; Notable for the following:    Hemoglobin 11.0 (*)    HCT 33.0 (*)    All other components within normal limits  PROTIME-INR - Abnormal; Notable for the following:    Prothrombin Time 17.4 (*)    All other components within normal limits  HCG, QUANTITATIVE, PREGNANCY - Abnormal; Notable for the following:    hCG, Beta Chain, Quant, S 1530 (*)    All other components within normal limits  URINALYSIS, ROUTINE W REFLEX MICROSCOPIC - Abnormal; Notable for the following:    Ketones, ur 15 (*)    All other components within normal limits  POC URINE PREG, ED - Abnormal; Notable for the following:    Preg Test, Ur POSITIVE (*)    All other components within normal limits  BASIC METABOLIC PANEL  I-STAT TROPOININ, ED    Imaging Review Dg Chest 2 View  11/10/2014   CLINICAL DATA:  Shortness of breath, history of pulmonary embolus. Right leg pain.  EXAM: CHEST  2 VIEW  COMPARISON:  CT chest angiography 07/04/2013  FINDINGS: The cardiomediastinal contours are normal. The lungs are clear. Pulmonary vasculature is normal. No consolidation, pleural effusion, or pneumothorax. No acute osseous abnormalities are seen.  IMPRESSION: Clear lungs.   Electronically Signed   By: Rubye OaksMelanie  Ehinger M.D.   On: 11/10/2014 13:19   Ct Angio Chest Pe W/cm &/or Wo Cm  11/10/2014   CLINICAL DATA:  History of pulmonary embolism. Leg cramping. Short of breath.  EXAM: CT ANGIOGRAPHY CHEST WITH CONTRAST  TECHNIQUE: Multidetector CT imaging of the chest was performed using the standard protocol during bolus administration of intravenous contrast. Multiplanar CT image reconstructions and MIPs were obtained to evaluate the  vascular anatomy.  CONTRAST:  100mL OMNIPAQUE IOHEXOL 350 MG/ML SOLN  COMPARISON:  07/04/2013  FINDINGS: The study is positive for acute bilateral pulmonary thromboembolism. Thrombus is present in the main right lower lobe lobar pulmonary artery extending into segmental branches. There is a minimal pulmonary embolism in the anterior basal segment of the left lower lobe. See image 129 of series 5. No evidence of left upper lobe, right upper lobe, or right middle lobe pulmonary embolism. Right ventricle to left ventricle ratio is 0.77. There is no evidence of right heart strain.  No abnormal mediastinal adenopathy.  No pneumothorax or pleural effusion.  Low lung volumes was scattered areas of volume loss.  No acute bony deformity.  Review of the MIP images confirms the above findings.  IMPRESSION: The study is positive for acute pulmonary thromboembolism primarily in the right lower lobe. There is no evidence of right heart strain. Critical  Value/emergent results were called by telephone at the time of interpretation on 11/10/2014 at 4:27 pm to Dr. Oswaldo Conroy , who verbally acknowledged these results.   Electronically Signed   By: Maryclare Bean M.D.   On: 11/10/2014 16:28     EKG Interpretation   Date/Time:  Monday November 10 2014 12:59:13 EST Ventricular Rate:  75 PR Interval:  158 QRS Duration: 79 QT Interval:  421 QTC Calculation: 470 R Axis:   53 Text Interpretation:  Sinus rhythm Normal ECG Confirmed by BEATON  MD,  ROBERT ([id number]) on 11/10/2014 1:05:08 PM      MDM   Final diagnoses:  SOB (shortness of breath)  Pregnancy  DVT (deep venous thrombosis), right  Pulmonary embolism   Patient with history of DVT and PE currently on warfarin. She had missed 2 doses. Patient with complaint of right lower leg cramping and pain. Also history of shortness of breath for the past couple days. Duplex shows new DVT on right. Patient also found to be pregnant. She was not aware. HCG 1530. Patient  subtherapeutic with INR 1.4.  Spoke with radiology who said CT in pregnancy with abdominal covering is still recommended. Discussed risks and fits of proceeding with the CT which include fetal malformation and developmental problems and detrimental effects to the fetus. Patient voiced understanding and agreement with the plan. CT showed new PEs in right lower lobe without any right ventricular strain. Patient not candidate for thrombolytics. Consult OB/GYN. They recommend starting Lovenox which is category B and does not cross the placenta. They also recommended transvaginal ultrasound which is pending. Spoke with internal medicine in consultation for admission to the hospital. Spoke with Dr. Evelena Peat who agrees with evaluation and admission to stepdown. Attending Dr. Heide Spark.   Discussed all results and patient verbalizes understanding and agrees with plan.  This is a shared patient. This patient was discussed with the physician who saw and evaluated the patient and agrees with the plan.     Louann Sjogren, PA-C 11/10/14 1745  Nelia Shi, MD 11/17/14 [phone]

## 2014-11-10 NOTE — Telephone Encounter (Signed)
Pt calls and states she has severe leg cramps, some shortness of breath, she spoke w/ dr Alexandria Lodgegroce and he told her he could see her today, she did not tell him she was having shortness of breath, triage spoke w/ dr Alexandria Lodgegroce and he agrees pt should go to ED asap, i called her back, lm for her to go to ED, waited 7 mins and recalled, she did not agree at first but it was stressed that with her hx and symptoms the emergent environment would best to evaluate her, she agreed and states she will [facility] go to [facility], she was ask to go to [facility] if at all possible
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
## 2014-11-10 NOTE — Progress Notes (Addendum)
ANTICOAGULATION CONSULT NOTE - Follow Up Consult  Pharmacy Consult for heparin Indication: PE/DVT with pregnancy  No Known Allergies  Patient Measurements: Height: 5\' 10"  (177.8 cm) Weight: 235 lb 10.8 oz (106.9 kg) IBW/kg (Calculated) : 68.5 Heparin Dosing Weight: 92kg  Vital Signs: Temp: 98.7 F (37.1 C) (12/21 2026) Temp Source: Oral (12/21 2026) BP: 121/78 mmHg (12/21 2026) Pulse Rate: 81 (12/21 2026)  Labs:  Recent Labs  11/10/14 1220  HGB 11.0*  HCT 33.0*  PLT 227  LABPROT 17.4*  INR 1.41  CREATININE 0.52    Estimated Creatinine Clearance: 122.6 mL/min (by C-G formula based on Cr of 0.52).   Medications:  Scheduled:  . sodium chloride   Intravenous Once  . acetaminophen  650 mg Oral Once    Assessment: 43yo female with acute DVT and history of DVT and PE in 2014 presents with new onset R leg pain and cramping on lovenox and to change to heparin infusion. Lovenox 105mg  was given at 5:18pm today.  Goal of Therapy:  Heparin level 0.3-0.7 units/ml Monitor platelets by anticoagulation protocol: Yes   Plan:   -Start heparin on 12/22 at 2:30am (no bolus)  -Begin infusion at 1500 units/hr (~ 16 units/kg/hr) -Heparin level in 8 hours and daily wth CBC daily  Harland Germanndrew Tabius Rood, Pharm D 11/10/2014 8:38 PM
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
## 2014-11-10 NOTE — ED Provider Notes (Signed)
Dr.  Chilton SiGreen from radiology called and advised that pt has a right sided ectopic  (not ruptured)  No free fluid.   Pt advised.   I spoke to Internal Medicine resident and advised of finding.   Pt is on step down unit.   I spoke to Dr. Penne LashLeggett.   Gyn.  She will discuss care with resident and consult   Elson AreasLeslie K Sofia, PA-C 11/10/14 1934  Rolland PorterMark James, MD 11/10/14 [phone]

## 2014-11-10 NOTE — Progress Notes (Signed)
VASCULAR LAB PRELIMINARY  PRELIMINARY  PRELIMINARY  PRELIMINARY  Bilateral lower extremity venous Dopplers completed.    Preliminary report:  There is acute DVT noted in right posterior tibial and popliteal veins.  All other veins appear thrombus free.   Elazar Argabright, RVT 11/10/2014, 2:19 PM

## 2014-11-10 NOTE — Progress Notes (Signed)
ED CM received consult from Dr. Radford PaxBeaton concerning assistance with anti-coagulant. Patient is positive for DVT.  Patient is unisured. Spoke with patient regarding  medication assistance available. ED evaluation still pending. CM will continue to follow up.

## 2014-11-11 ENCOUNTER — Encounter (HOSPITAL_COMMUNITY): Payer: Self-pay | Admitting: *Deleted

## 2014-11-11 DIAGNOSIS — Z86718 Personal history of other venous thrombosis and embolism: Secondary | ICD-10-CM

## 2014-11-11 DIAGNOSIS — Z331 Pregnant state, incidental: Secondary | ICD-10-CM

## 2014-11-11 LAB — HEPARIN LEVEL (UNFRACTIONATED)
Heparin Unfractionated: 0.66 IU/mL (ref 0.30–0.70)
Heparin Unfractionated: 0.58 IU/mL (ref 0.30–0.70)

## 2014-11-11 LAB — CBC
HCT: 32 % — ABNORMAL LOW (ref 36.0–46.0)
HEMOGLOBIN: 10.5 g/dL — AB (ref 12.0–15.0)
MCH: 27.6 pg (ref 26.0–34.0)
MCHC: 32.8 g/dL (ref 30.0–36.0)
MCV: 84 fL (ref 78.0–100.0)
Platelets: 231 10*3/uL (ref 150–400)
RBC: 3.81 MIL/uL — AB (ref 3.87–5.11)
RDW: 14.1 % (ref 11.5–15.5)
WBC: 6.1 10*3/uL (ref 4.0–10.5)

## 2014-11-11 LAB — MRSA PCR SCREENING: MRSA by PCR: NEGATIVE

## 2014-11-11 MED ORDER — ACETAMINOPHEN 325 MG PO TABS
650.0000 mg | ORAL_TABLET | Freq: Once | ORAL | Status: AC
  Administered 2014-11-11: 650 mg via ORAL
  Filled 2014-11-11: qty 2

## 2014-11-11 MED ORDER — INFLUENZA VAC SPLIT QUAD 0.5 ML IM SUSY
0.5000 mL | PREFILLED_SYRINGE | INTRAMUSCULAR | Status: AC
  Administered 2014-11-13: 0.5 mL via INTRAMUSCULAR
  Filled 2014-11-11 (×2): qty 0.5

## 2014-11-11 MED ORDER — ACETAMINOPHEN 325 MG PO TABS
650.0000 mg | ORAL_TABLET | Freq: Four times a day (QID) | ORAL | Status: DC | PRN
  Administered 2014-11-11 – 2014-11-13 (×7): 650 mg via ORAL
  Filled 2014-11-11 (×7): qty 2

## 2014-11-11 NOTE — Progress Notes (Signed)
Utilization review completed.  
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
## 2014-11-11 NOTE — Progress Notes (Addendum)
ANTICOAGULATION CONSULT NOTE - Follow Up Consult  Pharmacy Consult for heparin Indication: PE/DVT with ectopic pregnancy  No Known Allergies  Patient Measurements: Height: 5\' 10"  (177.8 cm) Weight: 241 lb 6.5 oz (109.5 kg) IBW/kg (Calculated) : 68.5 Heparin Dosing Weight: 92kg  Vital Signs: Temp: 98.4 F (36.9 C) (12/22 0515) Temp Source: Oral (12/22 0515) BP: 114/70 mmHg (12/22 0515) Pulse Rate: 103 (12/22 1033)  Labs:  Recent Labs  11/10/14 1220 11/11/14 0409 11/11/14 1025  HGB 11.0* 10.5*  --   HCT 33.0* 32.0*  --   PLT 227 231  --   LABPROT 17.4*  --   --   INR 1.41  --   --   HEPARINUNFRC  --   --  0.66  CREATININE 0.52  --   --     Estimated Creatinine Clearance: 124 mL/min (by C-G formula based on Cr of 0.52).  Assessment: 43yo female with acute DVT and history of DVT and PE in 2014 presents with new onset R leg pain and cramping- she was originally started on Lovenox, however now changed to heparin d/t concern for possible rupture of ectopic pregnancy and need for procedure. She was started on heparin 1500 units/hr and first heparin level is therapeutic at 0.66 units/mL.  Hgb low but stable, plts wnl. No bleeding noted at this time.  She is s/p methotrexate 50mg /m2 x1 dose last evening ~ midnight.  Goal of Therapy:  Heparin level 0.3-0.7 units/ml Monitor platelets by anticoagulation protocol: Yes   Plan:   1. Continue heparin at 1500 units/hr 2. Confirmatory heparin level in 6 hours 3. Daily heparin level and CBC 4. Follow closely for s/s bleeding 5. Follow for transition to long term anticoagulation when ob/gyn deems able  Kara Mcclain, PharmD, BCPS Clinical Pharmacist Pager: [phone] 11/11/2014 11:23 AM   Addendum: Confirmatory heparin level is therapeutic at 0.58 on 1500 units/hr. No bleeding noted.  Continue heparin drip at 1500 units/hr Follow-up am labs  [facility] For EndoscopyJennifer Mcclain, VermontPharm.D., BCPS Clinical Pharmacist Pager:  [phone] 11/11/2014 5:58 PM

## 2014-11-12 DIAGNOSIS — I82401 Acute embolism and thrombosis of unspecified deep veins of right lower extremity: Secondary | ICD-10-CM

## 2014-11-12 DIAGNOSIS — I2699 Other pulmonary embolism without acute cor pulmonale: Secondary | ICD-10-CM

## 2014-11-12 DIAGNOSIS — O009 Ectopic pregnancy, unspecified: Principal | ICD-10-CM

## 2014-11-12 LAB — HEPARIN LEVEL (UNFRACTIONATED): HEPARIN UNFRACTIONATED: 0.4 [IU]/mL (ref 0.30–0.70)

## 2014-11-12 LAB — CBC
HCT: 31.2 % — ABNORMAL LOW (ref 36.0–46.0)
HEMOGLOBIN: 10.4 g/dL — AB (ref 12.0–15.0)
MCH: 27.7 pg (ref 26.0–34.0)
MCHC: 33.3 g/dL (ref 30.0–36.0)
MCV: 83.2 fL (ref 78.0–100.0)
Platelets: 256 10*3/uL (ref 150–400)
RBC: 3.75 MIL/uL — ABNORMAL LOW (ref 3.87–5.11)
RDW: 14 % (ref 11.5–15.5)
WBC: 6.7 10*3/uL (ref 4.0–10.5)
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
## 2014-11-12 NOTE — Progress Notes (Signed)
ANTICOAGULATION CONSULT NOTE - Follow Up Consult  Pharmacy Consult for heparin Indication: PE/DVT with ectopic pregnancy  No Known Allergies  Patient Measurements: Height: 5\' 10"  (177.8 cm) Weight: 241 lb 6.5 oz (109.5 kg) IBW/kg (Calculated) : 68.5 Heparin Dosing Weight: 92kg  Vital Signs: Temp: 99.2 F (37.3 C) (12/23 0439) Temp Source: Oral (12/23 0439) BP: 109/63 mmHg (12/23 0439) Pulse Rate: 91 (12/23 0439)  Labs:  Recent Labs  11/10/14 1220 11/11/14 0409 11/11/14 1025 11/11/14 1708 11/12/14 0415  HGB 11.0* 10.5*  --   --  10.4*  HCT 33.0* 32.0*  --   --  31.2*  PLT 227 231  --   --  256  LABPROT 17.4*  --   --   --   --   INR 1.41  --   --   --   --   HEPARINUNFRC  --   --  0.66 0.58 0.40  CREATININE 0.52  --   --   --   --     Estimated Creatinine Clearance: 124 mL/min (by C-G formula based on Cr of 0.52).  Assessment: 43yo female with acute DVT and history of DVT and PE in 2014 presents with new onset R leg pain and cramping- she was originally started on Lovenox, however now changed to heparin d/t concern for possible rupture of ectopic pregnancy and need for procedure.  She was started on heparin 1500 units/hr and has had two therapeutic heparin levels on this rate. Hgb low but stable, plts wnl. Noted she is having some vaginal spotting. She is s/p methotrexate 50mg /m2 x1 dose on 12/22 around midnight.  Goal of Therapy:  Heparin level 0.3-0.7 units/ml Monitor platelets by anticoagulation protocol: Yes   Plan:   1. Continue heparin at 1500 units/hr 2. Daily heparin level and CBC 3. Follow closely for excessive bleeding 4. Follow for transition to long term anticoagulation when ob/gyn deems able  Giuliana Handyside D. Donne Baley, PharmD, BCPS Clinical Pharmacist Pager: [phone] 11/12/2014 8:28 AM
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
## 2014-11-13 LAB — CBC
HCT: 31.2 % — ABNORMAL LOW (ref 36.0–46.0)
Hemoglobin: 10.2 g/dL — ABNORMAL LOW (ref 12.0–15.0)
MCH: 27.3 pg (ref 26.0–34.0)
MCHC: 32.7 g/dL (ref 30.0–36.0)
MCV: 83.6 fL (ref 78.0–100.0)
Platelets: 275 10*3/uL (ref 150–400)
RBC: 3.73 MIL/uL — AB (ref 3.87–5.11)
RDW: 14.1 % (ref 11.5–15.5)
WBC: 6.2 10*3/uL (ref 4.0–10.5)

## 2014-11-13 LAB — HCG, QUANTITATIVE, PREGNANCY: HCG, BETA CHAIN, QUANT, S: 1422 m[IU]/mL — AB (ref <5)

## 2014-11-13 LAB — TYPE AND SCREEN
ABO/RH(D): A POS
Antibody Screen: NEGATIVE

## 2014-11-13 LAB — HEPARIN LEVEL (UNFRACTIONATED): Heparin Unfractionated: 0.37 IU/mL (ref 0.30–0.70)

## 2014-11-13 MED ORDER — WARFARIN SODIUM 2.5 MG PO TABS: 12.5000 mg | ORAL_TABLET | Freq: Every day | ORAL | Status: DC

## 2014-11-13 MED ORDER — WARFARIN - PHARMACIST DOSING INPATIENT: Freq: Every day | Status: DC

## 2014-11-13 MED ORDER — ENOXAPARIN SODIUM 40 MG/0.4ML ~~LOC~~ SOLN: 100.0000 mg | Freq: Two times a day (BID) | SUBCUTANEOUS | Status: DC

## 2014-11-13 MED ORDER — WARFARIN SODIUM 7.5 MG PO TABS: 15.0000 mg | ORAL_TABLET | Freq: Every day | ORAL | Status: DC

## 2014-11-13 MED ORDER — WARFARIN SODIUM 7.5 MG PO TABS
15.0000 mg | ORAL_TABLET | Freq: Every day | ORAL | Status: DC
  Filled 2014-11-13: qty 2

## 2014-11-13 MED ORDER — ENOXAPARIN SODIUM 100 MG/ML ~~LOC~~ SOLN
100.0000 mg | Freq: Two times a day (BID) | SUBCUTANEOUS | Status: DC
  Administered 2014-11-13: 100 mg via SUBCUTANEOUS
  Filled 2014-11-13 (×2): qty 1

## 2014-11-13 NOTE — Care Management Note (Signed)
    Page 1 of 1   11/13/2014     11:50:25 AM CARE MANAGEMENT NOTE 11/13/2014  Patient:  Kara Mcclain,Kara Mcclain   Account Number:  [id number]  Date Initiated:  11/11/2014  Documentation initiated by:  Kara PieriniWEBSTER,Kara  Subjective/Objective Assessment:   Pt admitted with PE     Action/Plan:   PTA pt lived at home- [facility]   Anticipated DC Date:  11/13/2014   Anticipated DC Plan:  HOME/SELF CARE      DC Planning Services  CM consult  Medication Assistance      Choice offered to / List presented to:             Status of service:  Completed, signed off Medicare Important Message given?  NO (If response is "NO", the following Medicare IM given date fields will be blank) Date Medicare IM given:   Medicare IM given by:   Date Additional Medicare IM given:   Additional Medicare IM given by:    Discharge Disposition:  HOME/SELF CARE  Per UR Regulation:  Reviewed for med. necessity/level of care/duration of stay  If discussed at Long Length of Stay Meetings, dates discussed:    Comments:  Kara Mceachron RN, BSN, MSHL, CCM  Nurse - Case Manager,  ([facility] [employer] coverage)  [phone]   11/13/2014 [facility] for [facility] plan:  Kara Mcclain on on Lovenox BID x 4 days. MATCH update provided by pharmacist / Kara BillowNita [phone] [facility] prior to 1pm closing time today.  Unit sending staff for pick up.   11/11/14- 1000- Kara PieriniKristi Webster RN, BSN [phone] Referral to call rounding team- spoke with Kara Mcclain this am who was unaware of CM needs at this time or why consult was placed- NCM to follow pt currently on IV heparin for PE

## 2014-11-13 NOTE — Progress Notes (Signed)
Discharge instructions given to patient with teach back. All questions were answered. Teaching done on lovenox injections and coumadin. Patient discharged home with belongings.

## 2014-11-13 NOTE — Progress Notes (Signed)
Pt seen and examined with Dr. Loma NewtonNgo. Case d/w residents in detail and plan formulated. I agree with findings and plan as documented in Dr. Wyatt HasteNgo's note  Pt feels well today. Did c/o mild cramping pain yesterday. R leg pain is improving  Physical Exam: Gen: AAO*3, NAD CVS: RRR, normal heart sounds Pulm: CTA b/l Abd: soft, non tender, BS + Ext: no edema/erythema. Mild R LE tenderness + - improved  Assessment and Plan: 43 y/o female with R LE DVT and b/l PE found to have an ectopic gestation  RLE DVT, b/l PE: - INR was subtherapeutic on admission - No evidence of right heart strain on CT  - d/c Heparin gtt and start lovenox bridge to coumadin - [facility] - CBC stable.   Ectopic gestation: - Pt with + HCG and R adnexal ectopic pregnancy noted on transvaginal ultrasound - Ob/gyn f/u appreciated - Repeat HCG today with 7 % decrease (1530 to 1422). Ob f/u noted- pt stable for d/c - [facility] another beta HCG level - No indication for Rhogam given Rh +  Pt stable for d/c home today

## 2014-11-13 NOTE — Discharge Instructions (Addendum)
Please take your coumadin and lovenox as prescribed. [facility] for a follow-up of your beta-HCG. Please follow up with coumadin clinic on 11/18/14 with Dr. Alexandria LodgeGroce. The clinic should also contact you regarding an appointment with your PCP. The clinic should call you with an appointment. Please return to the hospital immediately should you experience any severe abdominal pain.   Information on my medicine - Coumadin   (Warfarin)  This medication education was reviewed with me or my healthcare representative as part of my discharge preparation.  The pharmacist that spoke with me during my hospital stay was:  Bajbus, Lauren, RPH  Why was Coumadin prescribed for you? Coumadin was prescribed for you because you have a blood clot or a medical condition that can cause an increased risk of forming blood clots. Blood clots can cause serious health problems by blocking the flow of blood to the heart, lung, or brain. Coumadin can prevent harmful blood clots from forming. As a reminder your indication for Coumadin is:   Pulmonary Embolism Treatment  What test will check on my response to Coumadin? While on Coumadin (warfarin) you will need to have an INR test regularly to ensure that your dose is keeping you in the desired range. The INR (international normalized ratio) number is calculated from the result of the laboratory test called prothrombin time (PT).  If an INR APPOINTMENT HAS NOT ALREADY BEEN MADE FOR YOU please schedule an appointment to have this lab work done by your health care provider within 7 days. Your INR goal is usually a number between:  2 to 3 or your provider may give you a more narrow range like 2-2.5.  Ask your health care provider during an office visit what your goal INR is.  What  do you need to  know  About  COUMADIN? Take Coumadin (warfarin) exactly as prescribed by your healthcare provider about the same time each day.  DO NOT stop taking  without talking to the doctor who prescribed the medication.  Stopping without other blood clot prevention medication to take the place of Coumadin may increase your risk of developing a new clot or stroke.  Get refills before you run out.  What do you do if you miss a dose? If you miss a dose, take it as soon as you remember on the same day then continue your regularly scheduled regimen the next day.  Do not take two doses of Coumadin at the same time.  Important Safety Information A possible side effect of Coumadin (Warfarin) is an increased risk of bleeding. You should call your healthcare provider right away if you experience any of the following: ? Bleeding from an injury or your nose that does not stop. ? Unusual colored urine (red or dark brown) or unusual colored stools (red or black). ? Unusual bruising for unknown reasons. ? A serious fall or if you hit your head (even if there is no bleeding).  Some foods or medicines interact with Coumadin (warfarin) and might alter your response to warfarin. To help avoid this: ? Eat a balanced diet, maintaining a consistent amount of Vitamin K. ? Notify your provider about major diet changes you plan to make. ? Avoid alcohol or limit your intake to 1 drink for women and 2 drinks for men per day. (1 drink is 5 oz. wine, 12 oz. beer, or 1.5 oz. liquor.)  Make sure that ANY health care provider who prescribes medication for you  knows that you are taking Coumadin (warfarin).  Also make sure the healthcare provider who is monitoring your Coumadin knows when you have started a new medication including herbals and non-prescription products.  Coumadin (Warfarin)  Major Drug Interactions  Increased Warfarin Effect Decreased Warfarin Effect  Alcohol (large quantities) Antibiotics (esp. Septra/Bactrim, Flagyl, Cipro) Amiodarone (Cordarone) Aspirin (ASA) Cimetidine (Tagamet) Megestrol (Megace) NSAIDs (ibuprofen, naproxen, etc.) Piroxicam  (Feldene) Propafenone (Rythmol SR) Propranolol (Inderal) Isoniazid (INH) Posaconazole (Noxafil) Barbiturates (Phenobarbital) Carbamazepine (Tegretol) Chlordiazepoxide (Librium) Cholestyramine (Questran) Griseofulvin Oral Contraceptives Rifampin Sucralfate (Carafate) Vitamin K   Coumadin (Warfarin) Major Herbal Interactions  Increased Warfarin Effect Decreased Warfarin Effect  Garlic Ginseng Ginkgo biloba Coenzyme Q10 Green tea St. Johns wort    Coumadin (Warfarin) FOOD Interactions  Eat a consistent number of servings per week of foods HIGH in Vitamin K (1 serving =  cup)  Collards (cooked, or boiled & drained) Kale (cooked, or boiled & drained) Mustard greens (cooked, or boiled & drained) Parsley *serving size only =  cup Spinach (cooked, or boiled & drained) Swiss chard (cooked, or boiled & drained) Turnip greens (cooked, or boiled & drained)  Eat a consistent number of servings per week of foods MEDIUM-HIGH in Vitamin K (1 serving = 1 cup)  Asparagus (cooked, or boiled & drained) Broccoli (cooked, boiled & drained, or raw & chopped) Brussel sprouts (cooked, or boiled & drained) *serving size only =  cup Lettuce, raw (green leaf, endive, romaine) Spinach, raw Turnip greens, raw & chopped   These websites have more information on Coumadin (warfarin):  http://www.king-russell.com/www.coumadin.com; https://www.hines.net/www.ahrq.gov/consumer/coumadin.htm;

## 2014-11-13 NOTE — Progress Notes (Addendum)
ADDENDUM Patient cleared for discharge today, and is to go home on Lovenox bridge to therapeutic warfarin. Since she is a new clot, she will need a MINIMUM of 5 days of Lovenox treatment before she can stop. INR on admission 1.4, and she has not received warfarin since admission. Would expect INR to be back to ~1 currently. Home dose of warfarin 10mg  daily except 12.5mg  on Wednesdays  Plan: -start Lovenox 100mg  subq q12h starting at 1230 as heparin drip was turned off ~1115 this morning. (limited with size of syringes- to do 1.5mg /kg/day dosing would require 2 Lovenox syringes, and with her weight it is more reliable to use q12 dosing) -warfarin 15mg  po x3 days starting tonight, then 12.5mg  x2 days- then have INR checked on 12/29- if INR is in goal range, Lovenox can be stopped. *note- these orders have been entered into EPIC as written above and can be ordered when completing discharge orders* -will stop by and explain this to patient as well as provide a "refresher" on warfarin.  Tylin Force D. Jules Vidovich, PharmD, BCPS Clinical Pharmacist Pager: [phone] 11/13/2014 11:33 AM   ANTICOAGULATION CONSULT NOTE - Follow Up Consult  Pharmacy Consult for heparin Indication: PE/DVT with ectopic pregnancy  No Known Allergies  Patient Measurements: Height: 5\' 10"  (177.8 cm) Weight: 241 lb 6.5 oz (109.5 kg) IBW/kg (Calculated) : 68.5 Heparin Dosing Weight: 92kg  Vital Signs: Temp: 98.7 F (37.1 C) (12/24 0700) Temp Source: Oral (12/24 0700) BP: 112/60 mmHg (12/24 0459) Pulse Rate: 82 (12/24 0459)  Labs:  Recent Labs  11/10/14 1220 11/11/14 0409  11/11/14 1708 11/12/14 0415 11/13/14 0239  HGB 11.0* 10.5*  --   --  10.4* 10.2*  HCT 33.0* 32.0*  --   --  31.2* 31.2*  PLT 227 231  --   --  256 275  LABPROT 17.4*  --   --   --   --   --   INR 1.41  --   --   --   --   --   HEPARINUNFRC  --   --   < > 0.58 0.40 0.37  CREATININE 0.52  --   --   --   --   --   < > = values in this interval  not displayed.  Estimated Creatinine Clearance: 124 mL/min (by C-G formula based on Cr of 0.52).  Assessment: 43yo female with acute DVT and history of DVT and PE in 2014 presents with new onset R leg pain and cramping- she was originally started on Lovenox, however now changed to heparin d/t concern for possible rupture of ectopic pregnancy and need for procedure.  Her heparin level continues to remain therapeutic. She had some vaginal spotting, but that has been resolved. Hgb low but stable, plts wnl.   Goal of Therapy:  Heparin level 0.3-0.7 units/ml Monitor platelets by anticoagulation protocol: Yes   Plan:   1. Continue heparin at 1500 units/hr 2. Daily heparin level and CBC 3. Follow closely for excessive bleeding 4. Follow for transition to long term anticoagulation when ob/gyn deems able  Jizel Cheeks D. Kerith Sherley, PharmD, BCPS Clinical Pharmacist Pager: [phone] 11/13/2014 9:01 AM

## 2014-11-13 NOTE — Progress Notes (Signed)
Subjective: pt with ectopic, now day 4, of Ectopic protocol tx with Methotrexate(MTX). Pt has no pain and will be candidate for discharge today Patient reports no c/o related to abdomen.    Objective: I have reviewed patient's vital signs, intake and output, medications and labs. General: alert, cooperative and no distress GI: soft, non-tender; bowel sounds normal; no masses,  no organomegaly  Qhcg has declined by 7%, to 1400's, and is likely to continue a decline Assessment/Plan: 1. Plan: followup qhcg's thru [facility] at protocol of day 7 , Sunday 8 am, and then weekly unitl resolved. Dr NGO notified that [facility] once conversion to Lovenox /coumadin bridge is arranged..   LOS: 3 days    Kara Mcclain V 11/13/2014, 11:17 AM
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
## 2014-11-13 NOTE — Progress Notes (Addendum)
Subjective:  Doing well this morning. Reports a little cramping yesterday. Denies vaginal spotting or bleeding. Her leg is improving but still sore, controlled on tylenol.  Objective: Vital signs in last 24 hours: Filed Vitals:   11/12/14 1916 11/12/14 2323 11/12/14 2325 11/13/14 0459  BP: 123/57  110/65 112/60  Pulse: 98  104 82  Temp: 98.5 F (36.9 C) 99 F (37.2 C)  98.4 F (36.9 C)  TempSrc: Oral Oral  Oral  Resp: 25  19 17   Height:      Weight:      SpO2: 100%  100% 100%   Weight change:   Intake/Output Summary (Last 24 hours) at 11/13/14 0651 Last data filed at 11/13/14 0500  Gross per 24 hour  Intake    825 ml  Output      0 ml  Net    825 ml   General: alert, sitting up in bed, anxious HEENT: [place]/AT, PERRL, EOMI, no scleral icterus, mucus membranes moist Cardiac: RRR, normal S1/S2, no m/g/r Pulm: CTA bilaterally, breaths non-labored Abd: BS+, soft, nontender, non-distended, no guarding or rebound Ext: warm and well perfused, distal pulses 2+. Lower extremities appear symmetric with no redness or edema. There is mild tenderness to palpation of right calf, improved over the interval. Neuro: alert and oriented X 3, cranial nerves II-XII grossly intact  Lab Results: Basic Metabolic Panel:  Recent Labs Lab 11/10/14 1220  NA 139  K 4.1  CL 104  CO2 22  GLUCOSE 98  BUN 9  CREATININE 0.52  CALCIUM 9.1   Liver Function Tests:  Recent Labs Lab 11/10/14 1220 11/10/14 2230  AST 13 10  ALT 8  --   ALKPHOS 80  --   BILITOT 0.4  --   PROT 7.6  --   ALBUMIN 3.9  --    No results for input(s): LIPASE, AMYLASE in the last 168 hours. No results for input(s): AMMONIA in the last 168 hours. CBC:  Recent Labs Lab 11/10/14 1220  11/12/14 0415 11/13/14 0239  WBC 5.8  < > 6.7 6.2  NEUTROABS 3.6  --   --   --   HGB 11.0*  < > 10.4* 10.2*  HCT 33.0*  < > 31.2* 31.2*  MCV 83.3  < > 83.2 83.6  PLT 227  < > 256 275  < > = values in this interval not  displayed. Cardiac Enzymes: No results for input(s): CKTOTAL, CKMB, CKMBINDEX, TROPONINI in the last 168 hours. BNP: No results for input(s): PROBNP in the last 168 hours. D-Dimer: No results for input(s): DDIMER in the last 168 hours. CBG: No results for input(s): GLUCAP in the last 168 hours. Hemoglobin A1C: No results for input(s): HGBA1C in the last 168 hours. Fasting Lipid Panel: No results for input(s): CHOL, HDL, LDLCALC, TRIG, CHOLHDL, LDLDIRECT in the last 168 hours. Thyroid Function Tests: No results for input(s): TSH, T4TOTAL, FREET4, T3FREE, THYROIDAB in the last 168 hours. Coagulation:  Recent Labs Lab 11/10/14 1220  LABPROT 17.4*  INR 1.41   Anemia Panel: No results for input(s): VITAMINB12, FOLATE, FERRITIN, TIBC, IRON, RETICCTPCT in the last 168 hours. Urine Drug Screen: Drugs of Abuse     Component Value Date/Time   LABOPIA POSITIVE* 07/05/2013 0814   COCAINSCRNUR NONE DETECTED 07/05/2013 0814   LABBENZ NONE DETECTED 07/05/2013 0814   AMPHETMU NONE DETECTED 07/05/2013 0814   THCU POSITIVE* 07/05/2013 0814   LABBARB NONE DETECTED 07/05/2013 0814    Alcohol Level: No results  for input(s): ETH in the last 168 hours. Urinalysis:  Recent Labs Lab 11/10/14 1520  COLORURINE YELLOW  LABSPEC 1.028  PHURINE 6.5  GLUCOSEU NEGATIVE  HGBUR NEGATIVE  BILIRUBINUR NEGATIVE  KETONESUR 15*  PROTEINUR NEGATIVE  UROBILINOGEN 1.0  NITRITE NEGATIVE  LEUKOCYTESUR NEGATIVE   Micro Results: Recent Results (from the past 240 hour(s))  MRSA PCR Screening     Status: None   Collection Time: 11/10/14  9:08 PM  Result Value Ref Range Status   MRSA by PCR NEGATIVE NEGATIVE Final    Comment:        The GeneXpert MRSA Assay (FDA approved for NASAL specimens only), is one component of a comprehensive MRSA colonization surveillance program. It is not intended to diagnose MRSA infection nor to guide or monitor treatment for MRSA infections.     Studies/Results: No results found. Medications: I have reviewed the patient's current medications. Scheduled Meds: . sodium chloride   Intravenous Once  . Influenza vac split quadrivalent PF  0.5 mL Intramuscular Tomorrow-1000   Continuous Infusions: . heparin 1,500 Units/hr (11/12/14 1900)   PRN Meds:.acetaminophen Assessment/Plan: Principal Problem:   Pulmonary embolism Active Problems:   H/o Pulmonary Embolism.   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 Patient also found to have a unruptured ectopic pregnancy now status post methotrexate with a down trending beta hCG.  DVT of RLE and Bilateral Pulmonary Embolism: Likely due to hypercoagulable state given ectopic pregnancy (INR was 1.41 upon admission). Right leg pain is improved over the interval. This represents recurrent thromboembolic events for the patient which warrants future lifelong anticoagulation. -Will do Lovenox bridge to Coumadin at home.  -Continue acetaminophen for pain.  Ectopic Pregnancy: Unruptured right adnexal ectopic pregnancy on ultrasound of 5 weeks with no cardiac activity status post methotrexate on 11/10/2014. Hemoglobin has remained stable at 10.2. Beta HCG trended down to 1422 from 1530 (7% decrease), indicating a much reduced risk of ruptured ectopic pregnancy. -Appreciate OB/GYN recommendations: Patient cleared to go home and [facility], 11/16/2014 for another beta hCG level. -No need for Rhogam given A+ blood type.  Diet: Regular  VTE Prophylaxis: Heparin gtt Code: Full  Dispo: Disposition is deferred at this time, awaiting improvement of current medical  problems. Anticipated discharge in approximately 2 day(s).   The patient does have a current PCP [facility](Richard Kazibwe, MD) and [facility].  Outpatient medication changes: Patient will have a Lovenox bridge to Coumadin pending reduced risk for ectopic pregnancy rupture. Follow-up labs: Hemoglobin stable over last several days with no signs of acute bleeding. BMP within normal limits. [facility]. Coumadin clinic appointment for 11/17/14.   The patient does not have transportation limitations that hinder transportation to clinic appointments.  .Services Needed at time of discharge: Y = Yes, Blank = No PT:   OT:   RN:   Equipment:   Other:     LOS: 3 days   Harold BarbanLawrence Natalynn Pedone, MD 11/13/2014, 6:51 AM

## 2014-11-14 LAB — TYPE AND SCREEN
ABO/RH(D): A POS
ANTIBODY SCREEN: NEGATIVE
UNIT DIVISION: 0
Unit division: 0

## 2014-11-15 ENCOUNTER — Telehealth: Payer: Self-pay | Admitting: Internal Medicine

## 2014-11-15 NOTE — Telephone Encounter (Signed)
  INTERNAL MEDICINE RESIDENCY PROGRAM After-Hours Telephone Call    Reason for call:   I received a call from Ms. Si GaulAnisa C Colville at 11:45 AM, 11/15/2014 indicating that she is having abdominal pain.    Pertinent Data:   She was recently seen in the hospital for DVT/PE and ectopic pregnancy. She was given MTX and started on Coumadin during her hospital stay. She claims her pain had been well controlled with Tylenol during her admission, but states she is having some abdominal cramping that is causing her quite a bit of discomfort.  Denies abdominal tenderness, fever, chills, nausea, vomiting, vaginal bleeding or distension.     Assessment / Plan / Recommendations:   Advised her to continue Tylenol prn for the pain, no more than 3 g daily. Advised her that if the pain is not controlled w/ Tylenol that she should be re-evaluated in an urgent care center or [facility]. She states that is her pain continues, [facility] will [facility]   She also has a follow up appointment [facility]/ [facility].      Courtney ParisEden W Wesly Whisenant, MD   11/15/2014, 11:45 AM

## 2014-11-16 ENCOUNTER — Inpatient Hospital Stay (HOSPITAL_COMMUNITY): Payer: Medicaid Other

## 2014-11-16 ENCOUNTER — Encounter (HOSPITAL_COMMUNITY)
Admission: AD | Disposition: A | Discharge status: Home / Self Care | Payer: Self-pay | Source: Ambulatory Visit | Attending: Obstetrics and Gynecology

## 2014-11-16 ENCOUNTER — Encounter (HOSPITAL_COMMUNITY): Payer: Self-pay

## 2014-11-16 ENCOUNTER — Observation Stay (HOSPITAL_COMMUNITY)
Admission: AD | Admit: 2014-11-16 | Discharge: 2014-11-17 | Disposition: A | Discharge status: Home / Self Care | Payer: Medicaid Other | Source: Ambulatory Visit | Attending: Obstetrics and Gynecology | Admitting: Obstetrics and Gynecology

## 2014-11-16 DIAGNOSIS — Z86711 Personal history of pulmonary embolism: Secondary | ICD-10-CM | POA: Insufficient documentation

## 2014-11-16 DIAGNOSIS — Z8759 Personal history of other complications of pregnancy, childbirth and the puerperium: Secondary | ICD-10-CM

## 2014-11-16 DIAGNOSIS — O001 Tubal pregnancy: Secondary | ICD-10-CM | POA: Diagnosis not present

## 2014-11-16 DIAGNOSIS — O009 Unspecified ectopic pregnancy without intrauterine pregnancy: Secondary | ICD-10-CM

## 2014-11-16 HISTORY — PX: DIAGNOSTIC LAPAROSCOPY WITH REMOVAL OF ECTOPIC PREGNANCY: SHX6449

## 2014-11-16 LAB — CBC
HCT: 30.5 % — ABNORMAL LOW (ref 36.0–46.0)
Hemoglobin: 10.3 g/dL — ABNORMAL LOW (ref 12.0–15.0)
MCH: 28.5 pg (ref 26.0–34.0)
MCHC: 33.8 g/dL (ref 30.0–36.0)
MCV: 84.3 fL (ref 78.0–100.0)
Platelets: 315 10*3/uL (ref 150–400)
RBC: 3.62 MIL/uL — AB (ref 3.87–5.11)
RDW: 13.8 % (ref 11.5–15.5)
WBC: 6.1 10*3/uL (ref 4.0–10.5)

## 2014-11-16 LAB — PROTIME-INR
INR: 1.45 (ref 0.00–1.49)
PROTHROMBIN TIME: 17.8 s — AB (ref 11.6–15.2)

## 2014-11-16 LAB — PREPARE RBC (CROSSMATCH)

## 2014-11-16 LAB — ABO/RH: ABO/RH(D): A POS

## 2014-11-16 LAB — SURGICAL PCR SCREEN
MRSA, PCR: NEGATIVE
STAPHYLOCOCCUS AUREUS: NEGATIVE

## 2014-11-16 LAB — HCG, QUANTITATIVE, PREGNANCY: HCG, BETA CHAIN, QUANT, S: 1112 m[IU]/mL — AB (ref <5)

## 2014-11-16 SURGERY — LAPAROSCOPY, WITH ECTOPIC PREGNANCY SURGICAL TREATMENT
Anesthesia: General | Site: Abdomen

## 2014-11-16 MED ORDER — ACETAMINOPHEN 325 MG PO TABS: 325.0000 mg | ORAL_TABLET | ORAL | Status: DC | PRN

## 2014-11-16 MED ORDER — LIDOCAINE HCL (CARDIAC) 20 MG/ML IV SOLN
INTRAVENOUS | Status: AC
  Filled 2014-11-16: qty 5

## 2014-11-16 MED ORDER — LIDOCAINE-EPINEPHRINE (PF) 1 %-1:200000 IJ SOLN
INTRAMUSCULAR | Status: DC | PRN
  Administered 2014-11-16: 10 mL

## 2014-11-16 MED ORDER — FAMOTIDINE IN NACL 20-0.9 MG/50ML-% IV SOLN
20.0000 mg | INTRAVENOUS | Status: AC
  Administered 2014-11-16: 20 mg via INTRAVENOUS
  Filled 2014-11-16: qty 50

## 2014-11-16 MED ORDER — ENOXAPARIN SODIUM 100 MG/ML ~~LOC~~ SOLN
100.0000 mg | Freq: Two times a day (BID) | SUBCUTANEOUS | Status: DC
  Administered 2014-11-17: 100 mg via SUBCUTANEOUS
  Filled 2014-11-16: qty 1

## 2014-11-16 MED ORDER — MIDAZOLAM HCL 2 MG/2ML IJ SOLN
INTRAMUSCULAR | Status: AC
  Filled 2014-11-16: qty 2

## 2014-11-16 MED ORDER — SODIUM CHLORIDE 0.9 % IV SOLN
INTRAVENOUS | Status: DC
  Administered 2014-11-16 – 2014-11-17 (×2): via INTRAVENOUS

## 2014-11-16 MED ORDER — ONDANSETRON HCL 4 MG/2ML IJ SOLN
INTRAMUSCULAR | Status: AC
  Filled 2014-11-16: qty 2

## 2014-11-16 MED ORDER — SODIUM CHLORIDE 0.9 % IJ SOLN
INTRAMUSCULAR | Status: AC
Start: 2014-11-16 — End: 2014-11-16
  Filled 2014-11-16: qty 100

## 2014-11-16 MED ORDER — GLYCOPYRROLATE 0.2 MG/ML IJ SOLN
INTRAMUSCULAR | Status: AC
  Filled 2014-11-16: qty 4

## 2014-11-16 MED ORDER — LIDOCAINE HCL (CARDIAC) 20 MG/ML IV SOLN
INTRAVENOUS | Status: DC | PRN
  Administered 2014-11-16: 60 mg via INTRAVENOUS

## 2014-11-16 MED ORDER — FENTANYL CITRATE 0.05 MG/ML IJ SOLN
INTRAMUSCULAR | Status: DC | PRN
  Administered 2014-11-16: 50 ug via INTRAVENOUS
  Administered 2014-11-16: 100 ug via INTRAVENOUS
  Administered 2014-11-16 (×2): 50 ug via INTRAVENOUS
  Administered 2014-11-16: 100 ug via INTRAVENOUS
  Administered 2014-11-16: 50 ug via INTRAVENOUS

## 2014-11-16 MED ORDER — IBUPROFEN 600 MG PO TABS: 600.0000 mg | ORAL_TABLET | Freq: Four times a day (QID) | ORAL | Status: DC | PRN

## 2014-11-16 MED ORDER — DOCUSATE SODIUM 100 MG PO CAPS
100.0000 mg | ORAL_CAPSULE | Freq: Two times a day (BID) | ORAL | Status: DC
  Administered 2014-11-17: 100 mg via ORAL
  Filled 2014-11-16: qty 1

## 2014-11-16 MED ORDER — HYDROMORPHONE HCL 1 MG/ML IJ SOLN: 0.2500 mg | INTRAMUSCULAR | Status: DC | PRN

## 2014-11-16 MED ORDER — ONDANSETRON HCL 4 MG/2ML IJ SOLN
INTRAMUSCULAR | Status: DC | PRN
  Administered 2014-11-16: 4 mg via INTRAVENOUS

## 2014-11-16 MED ORDER — ROCURONIUM BROMIDE 100 MG/10ML IV SOLN
INTRAVENOUS | Status: AC
  Filled 2014-11-16: qty 1

## 2014-11-16 MED ORDER — LACTATED RINGERS IV SOLN
INTRAVENOUS | Status: DC | PRN
  Administered 2014-11-16: 17:00:00 via INTRAVENOUS

## 2014-11-16 MED ORDER — SODIUM CHLORIDE 0.9 % IJ SOLN
INTRAMUSCULAR | Status: AC
  Filled 2014-11-16: qty 10

## 2014-11-16 MED ORDER — GLYCOPYRROLATE 0.2 MG/ML IJ SOLN
INTRAMUSCULAR | Status: AC
  Filled 2014-11-16: qty 1

## 2014-11-16 MED ORDER — NEOSTIGMINE METHYLSULFATE 10 MG/10ML IV SOLN
INTRAVENOUS | Status: AC
  Filled 2014-11-16: qty 1

## 2014-11-16 MED ORDER — SUCCINYLCHOLINE CHLORIDE 20 MG/ML IJ SOLN
INTRAMUSCULAR | Status: DC | PRN
  Administered 2014-11-16: 100 mg via INTRAVENOUS

## 2014-11-16 MED ORDER — ONDANSETRON HCL 4 MG PO TABS: 4.0000 mg | ORAL_TABLET | Freq: Four times a day (QID) | ORAL | Status: DC | PRN

## 2014-11-16 MED ORDER — LIDOCAINE-EPINEPHRINE (PF) 1 %-1:200000 IJ SOLN
INTRAMUSCULAR | Status: AC
  Filled 2014-11-16: qty 10

## 2014-11-16 MED ORDER — CEFAZOLIN SODIUM-DEXTROSE 2-3 GM-% IV SOLR
2.0000 g | INTRAVENOUS | Status: AC
  Administered 2014-11-16: 2 g via INTRAVENOUS

## 2014-11-16 MED ORDER — PROPOFOL 10 MG/ML IV EMUL
INTRAVENOUS | Status: AC
  Filled 2014-11-16: qty 20

## 2014-11-16 MED ORDER — ACETAMINOPHEN 160 MG/5ML PO SOLN: 325.0000 mg | ORAL | Status: DC | PRN

## 2014-11-16 MED ORDER — ONDANSETRON HCL 4 MG/2ML IJ SOLN: 4.0000 mg | Freq: Once | INTRAMUSCULAR | Status: DC | PRN

## 2014-11-16 MED ORDER — LACTATED RINGERS IV SOLN
INTRAVENOUS | Status: DC | PRN
  Administered 2014-11-16 (×2): via INTRAVENOUS

## 2014-11-16 MED ORDER — NEOSTIGMINE METHYLSULFATE 10 MG/10ML IV SOLN
INTRAVENOUS | Status: DC | PRN
  Administered 2014-11-16: 5 mg via INTRAVENOUS

## 2014-11-16 MED ORDER — SODIUM CHLORIDE 0.9 % IJ SOLN
INTRAMUSCULAR | Status: AC
  Filled 2014-11-16: qty 3

## 2014-11-16 MED ORDER — PROPOFOL 10 MG/ML IV BOLUS
INTRAVENOUS | Status: DC | PRN
  Administered 2014-11-16: 200 mg via INTRAVENOUS

## 2014-11-16 MED ORDER — OXYCODONE-ACETAMINOPHEN 5-325 MG PO TABS
1.0000 | ORAL_TABLET | ORAL | Status: DC | PRN
  Administered 2014-11-16 – 2014-11-17 (×4): 1 via ORAL
  Filled 2014-11-16: qty 1
  Filled 2014-11-16: qty 2
  Filled 2014-11-16 (×2): qty 1

## 2014-11-16 MED ORDER — GLYCOPYRROLATE 0.2 MG/ML IJ SOLN
INTRAMUSCULAR | Status: DC | PRN
Start: 2014-11-16 — End: 2014-11-16
  Administered 2014-11-16: 1 mg via INTRAVENOUS

## 2014-11-16 MED ORDER — MIDAZOLAM HCL 2 MG/2ML IJ SOLN
INTRAMUSCULAR | Status: DC | PRN
  Administered 2014-11-16: 2 mg via INTRAVENOUS

## 2014-11-16 MED ORDER — PANTOPRAZOLE SODIUM 40 MG PO TBEC
40.0000 mg | DELAYED_RELEASE_TABLET | Freq: Every day | ORAL | Status: DC
  Administered 2014-11-17: 40 mg via ORAL
  Filled 2014-11-16: qty 1

## 2014-11-16 MED ORDER — FENTANYL CITRATE 0.05 MG/ML IJ SOLN
INTRAMUSCULAR | Status: AC
  Filled 2014-11-16: qty 5

## 2014-11-16 MED ORDER — SUCCINYLCHOLINE CHLORIDE 20 MG/ML IJ SOLN
INTRAMUSCULAR | Status: AC
  Filled 2014-11-16: qty 10

## 2014-11-16 MED ORDER — ONDANSETRON HCL 4 MG/2ML IJ SOLN: 4.0000 mg | Freq: Four times a day (QID) | INTRAMUSCULAR | Status: DC | PRN

## 2014-11-16 MED ORDER — CEFAZOLIN SODIUM-DEXTROSE 2-3 GM-% IV SOLR
INTRAVENOUS | Status: AC
  Filled 2014-11-16: qty 50

## 2014-11-16 MED ORDER — ROCURONIUM BROMIDE 100 MG/10ML IV SOLN
INTRAVENOUS | Status: DC | PRN
  Administered 2014-11-16: 30 mg via INTRAVENOUS

## 2014-11-16 MED ORDER — DEXAMETHASONE SODIUM PHOSPHATE 4 MG/ML IJ SOLN
INTRAMUSCULAR | Status: AC
  Filled 2014-11-16: qty 1

## 2014-11-16 MED ORDER — DEXAMETHASONE SODIUM PHOSPHATE 10 MG/ML IJ SOLN
INTRAMUSCULAR | Status: DC | PRN
  Administered 2014-11-16: 4 mg via INTRAVENOUS

## 2014-11-16 SURGICAL SUPPLY — 35 items
BARRIER ADHS 3X4 INTERCEED (GAUZE/BANDAGES/DRESSINGS) IMPLANT
BENZOIN TINCTURE PRP APPL 2/3 (GAUZE/BANDAGES/DRESSINGS) ×3 IMPLANT
BLADE SURG 11 STRL SS (BLADE) ×3 IMPLANT
CATH ROBINSON RED A/P 16FR (CATHETERS) ×3 IMPLANT
CLOSURE WOUND 1/2 X4 (GAUZE/BANDAGES/DRESSINGS) ×1
CLOSURE WOUND 1/4 X3 (GAUZE/BANDAGES/DRESSINGS) ×1
CLOTH BEACON ORANGE TIMEOUT ST (SAFETY) ×3 IMPLANT
DRSG COVADERM PLUS 2X2 (GAUZE/BANDAGES/DRESSINGS) ×3 IMPLANT
DRSG OPSITE POSTOP 3X4 (GAUZE/BANDAGES/DRESSINGS) ×3 IMPLANT
EVACUATOR SMOKE 8.L (FILTER) ×3 IMPLANT
GLOVE BIO SURGEON ST LM GN SZ9 (GLOVE) ×3 IMPLANT
GLOVE BIOGEL PI IND STRL 9 (GLOVE) ×2 IMPLANT
GLOVE BIOGEL PI INDICATOR 9 (GLOVE) ×4
GOWN STRL REUS W/TWL 2XL LVL3 (GOWN DISPOSABLE) ×3 IMPLANT
GOWN STRL REUS W/TWL LRG LVL3 (GOWN DISPOSABLE) ×6 IMPLANT
LIGASURE 5MM LAPAROSCOPIC (INSTRUMENTS) ×3 IMPLANT
LIQUID BAND (GAUZE/BANDAGES/DRESSINGS) ×3 IMPLANT
NEEDLE INSUFFLATION 120MM (ENDOMECHANICALS) ×3 IMPLANT
NS IRRIG 1000ML POUR BTL (IV SOLUTION) ×3 IMPLANT
PACK LAPAROSCOPY BASIN (CUSTOM PROCEDURE TRAY) ×3 IMPLANT
PAD TRENDELENBURG OR TABLE (MISCELLANEOUS) ×3 IMPLANT
POUCH SPECIMEN RETRIEVAL 10MM (ENDOMECHANICALS) ×3 IMPLANT
PROTECTOR NERVE ULNAR (MISCELLANEOUS) ×3 IMPLANT
SCISSORS LAP 5X35 DISP (ENDOMECHANICALS) IMPLANT
SET IRRIG TUBING LAPAROSCOPIC (IRRIGATION / IRRIGATOR) IMPLANT
SHEARS HARMONIC ACE PLUS 36CM (ENDOMECHANICALS) IMPLANT
STRIP CLOSURE SKIN 1/2X4 (GAUZE/BANDAGES/DRESSINGS) ×2 IMPLANT
STRIP CLOSURE SKIN 1/4X3 (GAUZE/BANDAGES/DRESSINGS) ×2 IMPLANT
SUT VIC AB 4-0 PS2 27 (SUTURE) ×3 IMPLANT
SUT VICRYL 0 UR6 27IN ABS (SUTURE) ×6 IMPLANT
TOWEL OR 17X24 6PK STRL BLUE (TOWEL DISPOSABLE) ×6 IMPLANT
TRAY FOLEY CATH 14FR (SET/KITS/TRAYS/PACK) IMPLANT
TROCAR OPTI TIP 5M 100M (ENDOMECHANICALS) ×6 IMPLANT
TROCAR XCEL DIL TIP R 11M (ENDOMECHANICALS) ×3 IMPLANT
WATER STERILE IRR 1000ML POUR (IV SOLUTION) ×3 IMPLANT

## 2014-11-16 NOTE — MAU Note (Signed)
Pt presents to MAU for repeat BHCG following ectopic pregnancy, methotrexate injection on December the 22nd. States mild lower abdominal pain, no vaginal bleeding

## 2014-11-16 NOTE — Brief Op Note (Signed)
11/16/2014  5:59 PM  PATIENT:  Si GaulAnisa C Tri  43 y.o. female  PRE-OPERATIVE DIAGNOSIS:  ECTOPIC PREGNANCY, right, with rupture  POST-OPERATIVE DIAGNOSIS:  ECTOPIC PREGNANCY right, with rupture  PROCEDURE:  Procedure(s): DIAGNOSTIC LAPAROSCOPY WITH REMOVAL OF ECTOPIC PREGNANCY (N/A), laparoscopic right salpingectomy  SURGEON:  Surgeon(s) and Role:    * Tilda BurrowJohn Adabella Stanis V, MD - Primary  PHYSICIAN ASSISTANT:   ASSISTANTS: [name], ______, CST   ANESTHESIA:   local and general  EBL:  Total I/O In: 1300 [I.V.:1300] Out: 20 [Blood:20]  BLOOD ADMINISTERED:none  DRAINS: none   LOCAL MEDICATIONS USED:  LIDOCAINE  and Amount: 10 ml  SPECIMEN:  Source of Specimen:  Right fallopian tube with ectopic  DISPOSITION OF SPECIMEN:  PATHOLOGY  COUNTS:  YES  TOURNIQUET:  * No tourniquets in log *  DICTATION: .Dragon Dictation  PLAN OF CARE: Admit for overnight observation  PATIENT DISPOSITION:  PACU - hemodynamically stable.   Delay start of Pharmacological VTE agent (>24hrs) due to surgical blood loss or risk of bleeding: no, will be delayed 12 hours with restart of Lovenox in a.m.

## 2014-11-16 NOTE — Transfer of Care (Signed)
Immediate Anesthesia Transfer of Care Note  Patient: Kara Mcclain  Procedure(s) Performed: Procedure(s): DIAGNOSTIC LAPAROSCOPY WITH REMOVAL OF ECTOPIC PREGNANCY (N/A)  Patient Location: PACU  Anesthesia Type:General  Level of Consciousness: awake, alert  and oriented  Airway & Oxygen Therapy: Patient Spontanous Breathing and Patient connected to nasal cannula oxygen  Post-op Assessment: Report given to PACU RN and Post -op Vital signs reviewed and stable  Post vital signs: Reviewed and stable  Complications: No apparent anesthesia complications

## 2014-11-16 NOTE — Anesthesia Procedure Notes (Signed)
Procedure Name: Intubation Date/Time: 11/16/2014 4:45 PM Performed by: Tilda BurrowFERGUSON, JOHN V Pre-anesthesia Checklist: Patient identified, Emergency Drugs available, Suction available, Patient being monitored and Timeout performed Patient Re-evaluated:Patient Re-evaluated prior to inductionOxygen Delivery Method: Circle system utilized Preoxygenation: Pre-oxygenation with 100% oxygen Intubation Type: IV induction, Rapid sequence and Cricoid Pressure applied Laryngoscope Size: Miller and 2 Grade View: Grade I Tube type: Oral Tube size: 7.0 mm Number of attempts: 1 Airway Equipment and Method: Stylet Placement Confirmation: ETT inserted through vocal cords under direct vision,  positive ETCO2 and breath sounds checked- equal and bilateral Secured at: 22 cm Tube secured with: Tape Dental Injury: Teeth and Oropharynx as per pre-operative assessment

## 2014-11-16 NOTE — Anesthesia Postprocedure Evaluation (Signed)
  Anesthesia Post-op Note  Patient: Kara Mcclain  Procedure(s) Performed: Procedure(s) (LRB): DIAGNOSTIC LAPAROSCOPY WITH REMOVAL OF ECTOPIC PREGNANCY (N/A)  Patient Location: PACU  Anesthesia Type: General  Level of Consciousness: awake and alert   Airway and Oxygen Therapy: Patient Spontanous Breathing  Post-op Pain: mild  Post-op Assessment: Post-op Vital signs reviewed, Patient's Cardiovascular Status Stable, Respiratory Function Stable, Patent Airway and No signs of Nausea or vomiting  Last Vitals:  Filed Vitals:   11/16/14 1815  BP: 137/81  Pulse: 91  Temp:   Resp: 18    Post-op Vital Signs: stable   Complications: No apparent anesthesia complications

## 2014-11-16 NOTE — MAU Note (Signed)
Report called to Selena BattenKim RN on women's unit. Will go to room 306

## 2014-11-16 NOTE — MAU Provider Note (Signed)
History     CSN: [id number]  Arrival date and time: 11/16/14 [id number]  Seen by provider at 1045     Chief Complaint  Patient presents with  . Labs Only   HPI Kara Mcclain 43 y.o. Comes to MAU for follow up quant today.  Was treated with methotrexate for ectopic pregnancy on 11-10-14.  Is here for day 7 blood draw.  Quant on 11-13-14 was 1422.  Client is having periodic abdominal pain that began Christmas evening, the [facility], and periodic pain in right leg.  Denies any vaginal bleeding.  Has been taking Tylenol for pain, but it has not been relieving her pain.  Requesting Vicodin for pain.  Significant history of DVT and PE.  Client was hospitalilzed until 12/24.and is on Lovonox.  Reviewed her discharge note from 11-13-14.  Walks slowly and uses a standing cane for mobility.  The patient had her last dose of lovenox at 12:30 am (13 hours at this time), and her last dose of coumadin was yesterdayat 6:30 pm.  Todays ultrasound shows that she has a stable ectopic size without fetal cardiac activity, but she has a moderate amount of blood in the pelvis, with HGB 10.3, unchanged from 10.2 and 10.4 noted during recent hospitalization. Blood type A Rh pos PTis minimally elevated at 17.8, and !NR upper limits normal at 1.45. Case discussed in detail with Dr. Rayburn MaBlackmon, Trauma Surgeon regarding timing of recommended surgery for bleeding ectopic. Currently surgery is planned as laparoscopic procedure, and Dr Rayburn MaBlackmon recommends against use of Vitamin K, and feels that proceeding with surgery this afternoon is preferred. He feels comfortable with procedure for INR under 1.8 as this is. Will set up  2 units PRBC, and have FFP available. Case discussed with Anesthesia. OB History    Gravida Para Term Preterm AB TAB SAB Ectopic Multiple Living   1               Past Medical History  Diagnosis Date  . Pulmonary embolism   . DVT (deep venous thrombosis)     "LLE"  (07/04/2013)  . Chest pain     2/2 related to PE with component of anxiety versus pericarditis  Improves with tylenol prn a few times a week EKG with long QTc and nonspecific t wave changes. 2d echo 08/23/2013 - normal  08/26/2013 referred to cards for evaluation Lipid check ordered.     Past Surgical History  Procedure Laterality Date  . No past surgeries      Family History  Problem Relation Age of Onset  . Pulmonary embolism Mother   . Deep vein thrombosis Mother   . Cancer Mother   . Hypertension Mother   . Stroke Father   . Gout Sister   . Diabetes Sister   . Hypertension Sister     History  Substance Use Topics  . Smoking status: Never Smoker   . Smokeless tobacco: Never Used  . Alcohol Use: 1.8 oz/week    3 Shots of liquor per week     Comment: sometimes    Allergies: No Known Allergies  Prescriptions prior to admission  Medication Sig Dispense Refill Last Dose  . acetaminophen (TYLENOL) 325 MG tablet Take 650 mg by mouth every 6 (six) hours as needed. Takes twice a week 2 tabs   11/10/2014 at Unknown time  . enoxaparin (LOVENOX) 40 MG/0.4ML injection Inject 1 mL (100 mg total) into the skin every 12 (twelve) hours. 8  mL 0   . warfarin (COUMADIN) 2.5 MG tablet Take 5 tablets (12.5 mg total) by mouth daily at 6 PM. 10 tablet 0   . warfarin (COUMADIN) 7.5 MG tablet Take 2 tablets (15 mg total) by mouth daily at 6 PM. 4 tablet 0     Review of Systems  Constitutional: Negative for fever.  Gastrointestinal: Positive for abdominal pain. Negative for nausea and vomiting.  Musculoskeletal:       Right leg pain - periodic   Physical Exam   Blood pressure 134/81, pulse 102, temperature 99.2 F (37.3 C), resp. rate 18, last menstrual period 11/03/2014.  Physical Exam  Nursing note and vitals reviewed. Constitutional: She is oriented to person, place, and time. She appears well-developed and well-nourished.  HENT:  Head: Normocephalic and atraumatic.  Eyes: EOM are  normal. Pupils are equal, round, and reactive to light.  Neck: Neck supple.  Cardiovascular: Normal rate.   Respiratory: Effort normal.  GI: Soft. She exhibits no distension. There is no tenderness. There is no rebound.  Genitourinary:  See u/s report   Musculoskeletal:  Walks slowly and uses a standing cane for mobility.  Neurological: She is alert and oriented to person, place, and time.  Skin: Skin is warm and dry.  Psychiatric: She has a normal mood and affect.    MAU Course  Procedures  MDM Dr. Emelda FearFerguson in to see client.  Due to anticoagulant therapy, client is at high risk for internal bleeding due to the ectopic pregnancy.  Although quant has dropped 22%, will do repeat ultrasound today and get labs. 1245  Dr. Emelda FearFerguson in MAU - reviewing ultrasound and talking with the radiologist. Dr. Emelda FearFerguson assumes care of this patient.  Assessment and Plan  Ruptured ectopic pregnancy.  [name],[name] 11/16/2014, 11:23 AM   Assessment:  Right Ectopic pregnancy with bleeding. S/P right leg DVT and pulmonary embolus 12/21 (MONDAY). S/p Anticoagulant tx with INR 1.45,   Plan: Will admit for prep for surgery, Laparoscopic Right salpingectomy tenatively planned for 4 pm. Risks reveiwed in detail, including risks of further bleeding, recurrent blood clots, recurrent pulmonary emboli, injury to adjacent organs , transfusion, or other sequellae. PT agrees to planned surgery.

## 2014-11-16 NOTE — Op Note (Addendum)
11/16/2014  5:59 PM  PATIENT:  Kara Mcclain  43 y.o. female  PRE-OPERATIVE DIAGNOSIS:  ECTOPIC PREGNANCY, right, with rupture  POST-OPERATIVE DIAGNOSIS:  ECTOPIC PREGNANCY right, with rupture  PROCEDURE:  Procedure(s): DIAGNOSTIC LAPAROSCOPY WITH REMOVAL OF ECTOPIC PREGNANCY (N/A), laparoscopic right salpingectomy  SURGEON:  Surgeon(s) and Role:    * Tilda BurrowJohn Yukari Flax V, MD - Primary  PHYSICIAN ASSISTANT:   ASSISTANTS: [name], ______, CST   ANESTHESIA:   local and general  EBL:  Total I/O In: 1300 [I.V.:1300] Out: 20 [Blood:20] APPROXIMATELY 250 CC OF OLD CLOTS REMOVED FROM ABDOMEN,   BLOOD ADMINISTERED:none  DRAINS: none   LOCAL MEDICATIONS USED:  LIDOCAINE  and Amount: 10 ml  SPECIMEN:  Source of Specimen:  Right fallopian tube with ectopic  DISPOSITION OF SPECIMEN:  PATHOLOGY  COUNTS:  YES  TOURNIQUET:  * No tourniquets in log *  DICTATION: .Dragon Dictation  PLAN OF CARE: Admit for overnight observation  PATIENT DISPOSITION:  PACU - hemodynamically stable.   Delay start of Pharmacological VTE agent (>24hrs) due to surgical blood loss or risk of bleeding: no, will be delayed 12 hours with restart of Lovenox in a.m. Details of procedure: Patient was taken to the operating room, prepped and draped for lower abdominal surgery. Foley catheter was inserted, and single-tooth tenaculum used to grasp the cervix. The abdomen was prepped and draped. Timeout had been conducted and procedure confirmed by surgical team. The infraumbilical vertical 1 Simmons skin incision was made as well as a transverse suprapubic and right lower quadrant incisions of similar length. Needle was used to achieve pneumoperitoneum, under 8 mm pressure with 10 mm laparoscopic trocar introduced through the umbilicus revealing a normal-appearing pelvis with some blood and distorted distended right fallopian tube. Clots in front of and behind the uterus estimated at 250 cc of clots upper abdomen showed  minimal amount of blood. Attention was then directed to the suprapubic and right lower quadrant trochars which were inserted under direct visualization, and attention directed to the pelvis where the fallopian tube could be elevated the broad ligament and mesosalpinx on the right identified and the fallopian tube amputated off of the mesosalpinx using the LigaSure with firm cautery before cutting. The right ovary was normal and intact. The uterus was normal in appearance and the left fallopian tube was quite small and normal in appearance without adhesions. Once the tube was cut free at the cornu, it was placed in an Endo Catch bag placed through the umbilical incision and extracted the Leica's well-being directly visualized by 5 mm camera through the suprapubic port. The pelvis was then irrigated and confirmed as hemostatic. The abdomen was deflated and the pedicle inspected and confirmed as hemostatic and the 2 trocar sites inspected and confirmed as normal in appearance as the trochars were removed. The continuation of deflation of abdomen was followed by fascial closure with 0 Vicryl at the umbilicus and subcuticular 4-0 Vicryl closure of all 3 incisions. Sponge and needle counts correct. Steri-Strips were applied and patient to recovery room in stable condition blood type is confirmed previously as a positive condition to recovery room good. Patient will be restarted on Lovenox tomorrow morning with resumption of Coumadin bridge tomorrow evening

## 2014-11-16 NOTE — Anesthesia Preprocedure Evaluation (Addendum)
Anesthesia Evaluation  Patient identified by MRN, date of birth, ID band Patient awake    Reviewed: Allergy & Precautions, H&P , NPO status , Patient's Chart, lab work & pertinent test results  Airway Mallampati: II  TM Distance: >3 FB Neck ROM: Full    Dental no notable dental hx. (+) Dental Advisory Given   Pulmonary PE Hx of centrilobular emphysema on CT scan, seen by pulmonology and no further workup or follow up was needed. Also has a hx of PE and DVT in 8/14. Has been on anticoagulation since with coumadin. 11/10/14 she started having R LE pain and was found to have a DVT in her R leg and PE. Was on a heparin infusion then switched to coumadin with a lovenox bridge on discharge.   breath sounds clear to auscultation  Pulmonary exam normal       Cardiovascular Exercise Tolerance: Good negative cardio ROS  Rhythm:Regular Rate:Normal     Neuro/Psych negative neurological ROS  negative psych ROS   GI/Hepatic negative GI ROS, Neg liver ROS,   Endo/Other  negative endocrine ROS  Renal/GU negative Renal ROS  negative genitourinary   Musculoskeletal negative musculoskeletal ROS (+)   Abdominal   Peds negative pediatric ROS (+)  Hematology negative hematology ROS (+)   Anesthesia Other Findings Ectopic pregnancy with abdominal bleeding, stable H/H  Reproductive/Obstetrics negative OB ROS                            Anesthesia Physical Anesthesia Plan  ASA: III  Anesthesia Plan: General   Post-op Pain Management:    Induction: Intravenous, Rapid sequence and Cricoid pressure planned  Airway Management Planned: Oral ETT  Additional Equipment:   Intra-op Plan:   Post-operative Plan: Extubation in OR and Possible Post-op intubation/ventilation  Informed Consent: I have reviewed the patients History and Physical, chart, labs and discussed the procedure including the risks, benefits and  alternatives for the proposed anesthesia with the patient or authorized representative who has indicated his/her understanding and acceptance.   Dental advisory given  Plan Discussed with: CRNA  Anesthesia Plan Comments:        Anesthesia Quick Evaluation

## 2014-11-17 ENCOUNTER — Encounter (HOSPITAL_COMMUNITY): Payer: Self-pay | Admitting: Obstetrics and Gynecology

## 2014-11-17 DIAGNOSIS — Z8759 Personal history of other complications of pregnancy, childbirth and the puerperium: Secondary | ICD-10-CM

## 2014-11-17 LAB — CBC
HCT: 26.7 % — ABNORMAL LOW (ref 36.0–46.0)
Hemoglobin: 9 g/dL — ABNORMAL LOW (ref 12.0–15.0)
MCH: 28.5 pg (ref 26.0–34.0)
MCHC: 33.7 g/dL (ref 30.0–36.0)
MCV: 84.5 fL (ref 78.0–100.0)
Platelets: 277 10*3/uL (ref 150–400)
RBC: 3.16 MIL/uL — ABNORMAL LOW (ref 3.87–5.11)
RDW: 14.1 % (ref 11.5–15.5)
WBC: 7.9 10*3/uL (ref 4.0–10.5)

## 2014-11-17 MED ORDER — OXYCODONE-ACETAMINOPHEN 5-325 MG PO TABS: 1.0000 | ORAL_TABLET | ORAL | Status: DC | PRN

## 2014-11-17 MED ORDER — WARFARIN SODIUM 7.5 MG PO TABS: 7.5000 mg | ORAL_TABLET | Freq: Every day | ORAL | Status: DC

## 2014-11-17 MED ORDER — ENOXAPARIN SODIUM 100 MG/ML ~~LOC~~ SOLN: 100.0000 mg | Freq: Two times a day (BID) | SUBCUTANEOUS | Status: DC

## 2014-11-17 NOTE — Progress Notes (Signed)
1 Day Post-Op Procedure(s) (LRB): DIAGNOSTIC LAPAROSCOPY WITH REMOVAL OF ECTOPIC PREGNANCY (N/A) S/P recent rt leg DVTand  Small pulmonary embolus.  Subjective: Patient reports tolerating PO and no problems voiding.  Feels well.  Objective: I have reviewed patient's vital signs and labs. CBC Latest Ref Rng 11/17/2014 11/16/2014 11/13/2014  WBC 4.0 - 10.5 K/uL 7.9 6.1 6.2  Hemoglobin 12.0 - 15.0 g/dL 9.0(L) 10.3(L) 10.2(L)  Hematocrit 36.0 - 46.0 % 26.7(L) 30.5(L) 31.2(L)  Platelets 150 - 400 K/uL 277 315 275   The decrease in HGB seems reasonable in that pt has been hydrated overnight, until now, at 125 /hr, and had 250 cc of clot removed from abdomen.   General: alert, cooperative and no distress GI: soft, non-tender; bowel sounds normal; no masses,  no organomegaly and incision: clean and intact Vaginal Bleeding: none  Assessment: s/p Procedure(s): DIAGNOSTIC LAPAROSCOPY WITH REMOVAL OF ECTOPIC PREGNANCY (N/A): stable and no symptoms related to DVT or PE  Plan: Discharge home restart lovenox this a.m., and add back coumadin this PM, will give pt an additional Rx for 5 days of Lovenox at 1mg /kg bid Pt has appt tomorrow at internal medicine clinic.  LOS: 1 day    Kara Mcclain 11/17/2014, 8:47 AM

## 2014-11-17 NOTE — Addendum Note (Signed)
Addendum  created 11/17/14 0748 by Suella Groveoderick C Alegandro Macnaughton, CRNA   Modules edited: Notes Section   Notes Section:  File: [id number]

## 2014-11-17 NOTE — Anesthesia Postprocedure Evaluation (Signed)
  Anesthesia Post-op Note  Patient: Kara Mcclain  Procedure(s) Performed: Procedure(s): DIAGNOSTIC LAPAROSCOPY WITH REMOVAL OF ECTOPIC PREGNANCY (N/A)  Patient Location: [facility]  Anesthesia Type:General  Level of Consciousness: awake  Airway and Oxygen Therapy: Patient Spontanous Breathing  Post-op Pain: none  Post-op Assessment: Post-op Vital signs reviewed, Patient's Cardiovascular Status Stable, Respiratory Function Stable, Patent Airway, No signs of Nausea or vomiting, Adequate PO intake and Pain level controlled  Post-op Vital Signs: Reviewed and stable  Last Vitals:  Filed Vitals:   11/17/14 0531  BP: 108/50  Pulse: 92  Temp: 36.9 C  Resp: 18    Complications: No apparent anesthesia complications
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
## 2014-11-17 NOTE — Discharge Summary (Addendum)
Physician Discharge Summary  Patient ID: Kara Mcclain MRN: [id number] DOB/AGE: 07/30/1971 43 y.o.  Admit date: 11/16/2014 Discharge date: 11/17/2014  Admission Diagnoses:ruptured ectopic, pulmonary embolus, DVT  Discharge Diagnoses: same Active Problems:   Status post ectopic pregnancy s/p surgical removal  Discharged Condition: good. To be seen TOMORROW AT medicine clinic. Please note that pt's anticoagulant doses are  Hospital Course: Pt was seen in the MAU for Day 7 Quant HCG, and had slight abd pain, and u/s showed significant blood in the abdomen. INR 1.45 . Taken to OR for laparoscopic salpingectomy , uncomplicated.  Consults: None  Significant Diagnostic Studies: labs:  CBC Latest Ref Rng 11/17/2014 11/16/2014 11/13/2014  WBC 4.0 - 10.5 K/uL 7.9 6.1 6.2  Hemoglobin 12.0 - 15.0 g/dL 9.0(L) 10.3(L) 10.2(L)  Hematocrit 36.0 - 46.0 % 26.7(L) 30.5(L) 31.2(L)  Platelets 150 - 400 K/uL 277 315 275      Treatments: surgery: right salpingectomy  Discharge Exam: Blood pressure 108/50, pulse 92, temperature 98.4 F (36.9 C), temperature source Oral, resp. rate 18, height 5\' 10"  (1.778 m), weight 109.317 kg (241 lb), last menstrual period 11/03/2014, SpO2 100 %. General appearance: alert, cooperative and no distress GI: soft, non-tender; bowel sounds normal; no masses,  no organomegaly Extremities: leg stable  Disposition: 01-Home or Self Care  Discharge Instructions    Call MD for:  extreme fatigue    Complete by:  As directed      Call MD for:  persistant dizziness or light-headedness    Complete by:  As directed      Call MD for:  persistant nausea and vomiting    Complete by:  As directed      Call MD for:  redness, tenderness, or signs of infection (pain, swelling, redness, odor or green/yellow discharge around incision site)    Complete by:  As directed      Call MD for:  severe uncontrolled pain    Complete by:  As directed      Diet - low sodium heart healthy     Complete by:  As directed      Discharge instructions    Complete by:  As directed   Keep medicine clinic appt tomorrow.     Increase activity slowly    Complete by:  As directed      Remove dressing in 48 hours    Complete by:  As directed             Medication List    TAKE these medications        acetaminophen 500 MG tablet  Commonly known as:  TYLENOL  Take 1,000 mg by mouth every 6 (six) hours as needed.     enoxaparin 40 MG/0.4ML injection  Commonly known as:  LOVENOX  Inject 1 mL (100 mg total) into the skin every 12 (twelve) hours.     enoxaparin 100 MG/ML injection  Commonly known as:  LOVENOX  Inject 1 mL (100 mg total) into the skin every 12 (twelve) hours.     oxyCODONE-acetaminophen 5-325 MG per tablet  Commonly known as:  PERCOCET/ROXICET  Take 1-2 tablets by mouth every 4 (four) hours as needed for severe pain (moderate to severe pain (when tolerating fluids)).     warfarin 7.5 MG tablet  Commonly known as:  COUMADIN  Take 1 tablet (7.5 mg total) by mouth daily. Takes 7.5 mg on Tuesdays and Thursdays and 5 mg all other days of the week  SignedTilda Mcclain: [name] V 11/17/2014, 9:03 AM

## 2014-11-17 NOTE — Progress Notes (Signed)
Pt discharged to home with mother.  Condition stable.  Pt verbalized medication regimen for Lovenox and Coumadin and how to take the medication this week while bridging. Pt states she is to see her medical doctor on Friday for bloodwork and medication re-evaluation.  Pt to car via wheelchair with Dolphus Jenny. Riley, NT.  No equipment for home ordered at discharge.

## 2014-11-18 ENCOUNTER — Ambulatory Visit (INDEPENDENT_AMBULATORY_CARE_PROVIDER_SITE_OTHER): Payer: Self-pay | Admitting: Internal Medicine

## 2014-11-18 ENCOUNTER — Encounter: Payer: Self-pay | Admitting: Internal Medicine

## 2014-11-18 ENCOUNTER — Telehealth: Payer: Self-pay | Admitting: *Deleted

## 2014-11-18 ENCOUNTER — Encounter: Payer: Self-pay | Admitting: Pharmacist

## 2014-11-18 VITALS — BP 120/76 | HR 95 | Temp 98.3°F | Ht 70.0 in | Wt 243.9 lb

## 2014-11-18 DIAGNOSIS — I82401 Acute embolism and thrombosis of unspecified deep veins of right lower extremity: Secondary | ICD-10-CM

## 2014-11-18 DIAGNOSIS — I82403 Acute embolism and thrombosis of unspecified deep veins of lower extremity, bilateral: Secondary | ICD-10-CM

## 2014-11-18 DIAGNOSIS — Z8759 Personal history of other complications of pregnancy, childbirth and the puerperium: Secondary | ICD-10-CM

## 2014-11-18 DIAGNOSIS — Z7901 Long term (current) use of anticoagulants: Secondary | ICD-10-CM

## 2014-11-18 DIAGNOSIS — I2699 Other pulmonary embolism without acute cor pulmonale: Secondary | ICD-10-CM

## 2014-11-18 DIAGNOSIS — Z86711 Personal history of pulmonary embolism: Secondary | ICD-10-CM

## 2014-11-18 LAB — POCT INR: INR: 3.2

## 2014-11-18 MED ORDER — WARFARIN SODIUM 2.5 MG PO TABS: 2.5000 mg | ORAL_TABLET | Freq: Every day | ORAL | Status: DC

## 2014-11-18 NOTE — Progress Notes (Signed)
Patient ID: Kara Mcclain, female   DOB: [birth year], 43 y.o.   MRN: [id number]   Subjective:   HPI: [name] is a 43 y.o. woman with past medical history of recurrent DVTs and PEs presents for hospital follow-up visit. She was discharged from the hospital on 11/17/2014 with a second episode of PE and DVT. She was also found to have a right ruptured ectopic pregnancy and she had a laparoscopic salpingectomy on 11/16/2014.  She feels well today despite some pain in the right lower extremity where she had the most recent DVT. She denies any bleeding episodes. No shortness of breath, chest pain or palpitations. She denies abdominal pain. She was [facility] yesterday on Coumadin with Lovenox bridging. She has taken at least 4 days total of Lovenox. Her INR today is therapeutic at 3.2.   ROS: Constitutional: Denies fever, chills, diaphoresis, appetite change and fatigue.  Respiratory: Denies SOB, DOE, cough, chest tightness, and wheezing. Denies chest pain. CVS: No chest pain, palpitations and leg swelling.  GI: No abdominal pain, nausea, vomiting, bloody stools GU: No dysuria, frequency, hematuria, or flank pain.  MSK: No myalgias, back pain, joint swelling, arthralgias  Psych: No depression symptoms. No Kara or SA.    Objective:  Physical Exam: Filed Vitals:   11/18/14 0845  BP: 120/76  Pulse: 95  Temp: 98.3 F (36.8 C)  TempSrc: Oral  Height: 5\' 10"  (1.778 m)  Weight: 243 lb 14.4 oz (110.632 kg)  SpO2: 100%   General: Well nourished. No acute distress.  HEENT: Normal oral mucosa. MMM.  Lungs: CTA bilaterally. Heart: RRR; no extra sounds or murmurs  Abdomen: Non-distended, normal bowel sounds, soft, nontender; no hepatosplenomegaly Extremities: No pedal edema. No joint swelling or tenderness. Neurologic: Normal EOM,  Alert and oriented x3. No obvious neurologic/cranial nerve deficits.  Assessment & Plan:  Discussed case with my attending in the  clinic, Dr. Dalphine HandingBhardwaj See problem based charting.

## 2014-11-18 NOTE — Patient Instructions (Signed)
General Instructions: Please stop taking Lovenox after this afternoon dose Please continue with coumadin Please come back for a check up on Monday 11/24/2013  Please bring your medicines with you each time you come to clinic.  Medicines may include prescription medications, over-the-counter medications, herbal remedies, eye drops, vitamins, or other pills.   Progress Toward Treatment Goals:  No flowsheet data found.  Self Care Goals & Plans:  No flowsheet data found.  No flowsheet data found.   Care Management & Community Referrals:  No flowsheet data found.

## 2014-11-18 NOTE — Telephone Encounter (Signed)
Kennon RoundsSally calls to f/u on pt and lovenox therapy, reviewed chart w/ her from visit today, reviewed plan from today on, she will close this out and Purnell Shoemakerkaye will call her tomorrow at [phone]

## 2014-11-18 NOTE — Assessment & Plan Note (Addendum)
Completed at least 5 days of Lovenox altogether. INR is 3.2. No bleeding. Plan -Discontinue Lovenox. [facility] -Continue with Coumadin -Followed by the Coumadin clinic for INR checks on Monday, 11/24/2014.

## 2014-11-18 NOTE — Progress Notes (Addendum)
ANTICOAGULATION PROGRESS NOTE Si Gaulnisa C Harjo is a 43 y.o. female who reports to the clinic for monitoring of anticoagulation treatment.    Anticoagulation Clinic Visit History: Anticoagulation Episode Summary    Current INR goal 2.0-3.0  Next INR check 09/15/2014  INR from last check 2.00 (08/18/2014)  Most recent INR 3.2! (11/18/2014)  Weekly max dose   Target end date   INR check location [facility]  Preferred lab   Send INR reminders to    Indications  H/o DVT and PE 06/2013 [Z86.718] H/o Pulmonary Embolism. [N82.956][Z86.711] Long term current use of anticoagulant therapy [Z79.01]        Comments Warfarin 2.5 mg tablet       ASSESSMENT Recent Results: Recent results are below. Of note, patient had a recent hospitalization as well as procedure for ectopic pregnancy, during which time her dose was changed to 40 mg per week.  Lab Results  Component Value Date   INR 3.2 11/18/2014   INR 1.45 11/16/2014   INR 1.41 11/10/2014   INR today: Supratherapeutic  Anticoagulation Dosing: Anticoagulation Dose Instructions as of 11/13/2014   Total Sun Mon Tue Wed Thu Fri Sat  40 mg 5 mg 5 mg 7.5 mg 5 mg 7.5 mg 5 mg 5 mg   NEW Anticoagulation Dose Instructions as of 11/18/14 Total Sun Mon Tue Wed Thu Fri Sat  37.5 mg 5 mg INR test 5 mg 7.5 mg 5 mg 5 mg 5 mg    PLAN Weekly dose was decreased by 6% to 37.5 mg per week  Patient Instructions: Patient was instructed to change warfarin dose as indicated above  Follow-up 6 days  Marzetta BoardJennifer [name], PharmD BCPS, BCACP

## 2014-11-18 NOTE — Assessment & Plan Note (Signed)
No complaints of abdominal pain. Abdominal exam is benign.  Plan -Follow-up with gynecology.

## 2014-11-18 NOTE — Assessment & Plan Note (Addendum)
Complains of pain in the right lower extremity. Right lower extremity slightly larger than the left but with good distal pulses.  Plan -Continue with Coumadin.

## 2014-11-18 NOTE — Assessment & Plan Note (Signed)
Currently asymptomatic. Pulse rate slightly elevated at 95. Chest exam otherwise normal.  Plan -Continue with Coumadin -Stop Lovenox.

## 2014-11-18 NOTE — Progress Notes (Signed)
Internal Medicine Clinic Attending  Case discussed with Dr. Kazibwe at the time of the visit.  We reviewed the resident's history and exam and pertinent patient test results.  I agree with the assessment, diagnosis, and plan of care documented in the resident's note. 

## 2014-11-19 LAB — TYPE AND SCREEN
ABO/RH(D): A POS
Antibody Screen: NEGATIVE
UNIT DIVISION: 0
UNIT DIVISION: 0
Unit division: 0
Unit division: 0

## 2014-11-24 ENCOUNTER — Ambulatory Visit: Payer: Self-pay

## 2014-11-24 ENCOUNTER — Ambulatory Visit (INDEPENDENT_AMBULATORY_CARE_PROVIDER_SITE_OTHER): Payer: Self-pay | Admitting: Internal Medicine

## 2014-11-24 ENCOUNTER — Ambulatory Visit (INDEPENDENT_AMBULATORY_CARE_PROVIDER_SITE_OTHER): Payer: Self-pay | Admitting: Pharmacist

## 2014-11-24 ENCOUNTER — Encounter: Payer: Self-pay | Admitting: Internal Medicine

## 2014-11-24 ENCOUNTER — Ambulatory Visit: Payer: Self-pay | Admitting: Internal Medicine

## 2014-11-24 VITALS — BP 147/88 | HR 80 | Temp 97.8°F | Ht 70.0 in | Wt 214.6 lb

## 2014-11-24 DIAGNOSIS — I82409 Acute embolism and thrombosis of unspecified deep veins of unspecified lower extremity: Secondary | ICD-10-CM

## 2014-11-24 DIAGNOSIS — I2699 Other pulmonary embolism without acute cor pulmonale: Secondary | ICD-10-CM

## 2014-11-24 DIAGNOSIS — I82401 Acute embolism and thrombosis of unspecified deep veins of right lower extremity: Secondary | ICD-10-CM

## 2014-11-24 DIAGNOSIS — M722 Plantar fascial fibromatosis: Secondary | ICD-10-CM

## 2014-11-24 DIAGNOSIS — Z7901 Long term (current) use of anticoagulants: Secondary | ICD-10-CM

## 2014-11-24 DIAGNOSIS — Z8759 Personal history of other complications of pregnancy, childbirth and the puerperium: Secondary | ICD-10-CM

## 2014-11-24 DIAGNOSIS — I82403 Acute embolism and thrombosis of unspecified deep veins of lower extremity, bilateral: Secondary | ICD-10-CM

## 2014-11-24 LAB — POCT INR: INR: 4.2

## 2014-11-24 MED ORDER — WARFARIN SODIUM 5 MG PO TABS: 5.0000 mg | ORAL_TABLET | Freq: Every day | ORAL | Status: DC

## 2014-11-24 MED ORDER — OXYCODONE-ACETAMINOPHEN 5-325 MG PO TABS: 1.0000 | ORAL_TABLET | Freq: Four times a day (QID) | ORAL | Status: DC | PRN

## 2014-11-24 MED ORDER — DOCUSATE SODIUM 100 MG PO CAPS: 100.0000 mg | ORAL_CAPSULE | Freq: Every day | ORAL | Status: AC | PRN

## 2014-11-24 NOTE — Patient Instructions (Addendum)
General Instructions: Please follow up with Dr. Alexandria Lodge and take Coumadin as directed to stay therapeutic.  Please call for a visit sooner than your appointment if pain persists.  Take colace for constipation.  Please bring your medicines with you each time you come to clinic.  Medicines may include prescription medications, over-the-counter medications, herbal remedies, eye drops, vitamins, or other pills.   Progress Toward Treatment Goals:  No flowsheet data found.  Self Care Goals & Plans:  No flowsheet data found.  No flowsheet data found.   Care Management & Community Referrals:  No flowsheet data found.

## 2014-11-24 NOTE — Progress Notes (Signed)
Anti-Coagulation Progress Note  Kara Mcclain is a 44 y.o. female who is currently on an anti-coagulation regimen.    RECENT RESULTS: Recent results are below, the most recent result is correlated with a dose of 72.5 mg. per week: Lab Results  Component Value Date   INR 4.2 11/24/2014   INR 3.2 11/18/2014   INR 1.45 11/16/2014    ANTI-COAG DOSE: Anticoagulation Dose Instructions as of 11/24/2014      [name] Tue Wed Thu Fri Sat   New Dose 0 mg Hold 5 mg 5 mg 5 mg 0 mg 0 mg    Description        OMIT today's dose. Will recommence tomorrow at  PO qd, Tu/Wed/Th and re-collect fingerstick INR on Friday 8-JAN-16 at 0930h.       ANTICOAG SUMMARY: Anticoagulation Episode Summary    Current INR goal 2.0-3.0  Next INR check 11/28/2014  INR from last check 4.2! (11/24/2014)  Weekly max dose   Target end date   INR check location [facility]  Preferred lab   Send INR reminders to    Indications  DVT lower extremity recurrent [I82.409] Recurrent pulmonary embolism [I26.99] Long term current use of anticoagulant therapy [Z79.01]        Comments         ANTICOAG TODAY: Anticoagulation Summary as of 11/24/2014    INR goal 2.0-3.0  Selected INR 4.2! (11/24/2014)  Next INR check 11/28/2014  Target end date    Indications  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  Patient instructed to OMIT today's dose.  Patient verbalized understanding of these instructions.       FOLLOW-UP Return in 4 days (on 11/28/2014) for Follow up INR at 0930h.  [name], III Pharm.D., CACP

## 2014-11-24 NOTE — Patient Instructions (Signed)
Patient instructed to take medications as defined in the Anti-coagulation Track section of this encounter.  Patient instructed to OMIT today's dose.  Patient verbalized understanding of these instructions.    

## 2014-11-24 NOTE — Progress Notes (Signed)
[facility] Subjective:   Patient ID: Kara Mcclain female   DOB: Jun 22, 1971 44 y.o.   MRN: [id number]  HPI: [name] is a 44 y.o. female with a PMH below who presents for follow up. She recently was admitted to [facility] for leg pain and found to have a recurrent DVT and PE while subtheraputic on coumadin.  She has also found to have an ectopic pregnancy at that time.  After discharge she was readmitted to [facility] for surgery due to ruptured ectopic pregnancy.  She reports she is doing well now and denies any abdominal pain, SOB, or chest pain,  She is still having pain in her right popliteal fossa that brought her to the hospital but that was improved with percocet she was given after discharge.  She reports she has been using her right leg less due to this and now has some pain/tightness at her right heel.  She has been closely following with Dr. Alexandria Lodge in our coumadin clinic.    Past Medical History  Diagnosis Date  . Pulmonary embolism   . DVT (deep venous thrombosis)     "LLE" (07/04/2013)  . Chest pain     2/2 related to PE with component of anxiety versus pericarditis  Improves with tylenol prn a few times a week EKG with long QTc and nonspecific t wave changes. 2d echo 08/23/2013 - normal  08/26/2013 referred to cards for evaluation Lipid check ordered.    Current Outpatient Prescriptions  Medication Sig Dispense Refill  . oxyCODONE-acetaminophen (PERCOCET/ROXICET) 5-325 MG per tablet Take 1-2 tablets by mouth every 4 (four) hours as needed for severe pain (moderate to severe pain (when tolerating fluids)). 30 tablet 0  . warfarin (COUMADIN) 2.5 MG tablet Take 1 tablet (2.5 mg total) by mouth daily. Take 2 tablets by mouth daily, except take 2 and 1/2 tablets on Wednesdays. Continue monitoring and dosing per Coumadin Clinic instruction. 48 tablet 0   No current facility-administered medications for this visit.   Family History  Problem Relation  Age of Onset  . Pulmonary embolism Mother   . Deep vein thrombosis Mother   . Cancer Mother   . Hypertension Mother   . Stroke Father   . Gout Sister   . Diabetes Sister   . Hypertension Sister    History   Social History  . Marital Status: Married    Spouse Name: N/A    Number of Children: N/A  . Years of Education: N/A   Social History Main Topics  . Smoking status: Never Smoker   . Smokeless tobacco: Never Used  . Alcohol Use: 1.8 oz/week    3 Shots of liquor per week     Comment: sometimes  . Drug Use: No     Comment: 07/04/2013 "last marijuana ~ 3 wks ago"  . Sexual Activity: Yes    Birth Control/ Protection: None   Other Topics Concern  . None   Social History Narrative   Review of Systems: Review of Systems  Constitutional: Negative for fever, chills, weight loss and malaise/fatigue.  Eyes: Negative for blurred vision.  Respiratory: Negative for cough and shortness of breath.   Cardiovascular: Negative for chest pain and leg swelling.  Gastrointestinal: Negative for heartburn, nausea, vomiting and abdominal pain.  Genitourinary: Negative for dysuria.  Skin: Negative for rash.  Neurological: Negative for dizziness.  Psychiatric/Behavioral: Negative for depression.    Objective:  Physical Exam: Filed Vitals:   11/24/14 1534  BP: 147/88  Pulse: 80  Temp: 97.8 F (36.6 C)  TempSrc: Oral  Height:  (1.778 m)  Weight: 214 lb 9.6 oz (97.342 kg)  SpO2: 100%  Physical Exam  Constitutional: She is well-developed, well-nourished, and in no distress.  Cardiovascular: Normal rate, regular rhythm and normal heart sounds.   Pulmonary/Chest: Effort normal and breath sounds normal.  Abdominal: Soft. Bowel sounds are normal.  Musculoskeletal:  Tenderness of right popliteal fossa.  Tenderness at insertion of plantar fascia into calcanus  Skin:     Nursing note and vitals reviewed. Physical Exam  Constitutional: She is well-developed, well-nourished, and  in no distress.  Cardiovascular: Normal rate, regular rhythm and normal heart sounds.   Pulmonary/Chest: Effort normal and breath sounds normal.  Abdominal: Soft. Bowel sounds are normal.  Musculoskeletal:  Tenderness of right popliteal fossa.  Tenderness at insertion of plantar fascia into calcanus  Skin:     Nursing note and vitals reviewed.   Assessment & Plan:  Case discussed with Dr. Rogelia Boga  DVT, lower extremity, recurrent - Patient currently supratheraptic due to misunderstanding of how to take medication.  His has been clarified and I have changed her med list to reflect the  pills she is taking. - She is still having pain at the clot site.  I advised that this pain may last for some time but it also may still be in the acute phase.  The important thing is to continue A/C.  I have also given her a temporary limited supply of percocet for pain.  Plantar fasciitis of right foot - Instructed patient on stretching exercises  Status post ectopic pregnancy - Doing well, keeping follow up appointments with OB/GYN.    Medications Ordered Meds ordered this encounter  Medications  . oxyCODONE-acetaminophen (PERCOCET/ROXICET) 5-325 MG per tablet    Sig: Take 1 tablet by mouth every 6 (six) hours as needed for severe pain (moderate to severe pain (when tolerating fluids)).    Dispense:  30 tablet    Refill:  0  . docusate sodium (COLACE) 100 MG capsule    Sig: Take 1 capsule (100 mg total) by mouth daily as needed.    Dispense:  30 capsule    Refill:  1  . warfarin (COUMADIN) 5 MG tablet    Sig: Take 1 tablet (5 mg total) by mouth daily. Please take as instructed by Dr. Alexandria Lodge.    Dispense:  30 tablet    Refill:  0   Other Orders No orders of the defined types were placed in this encounter.

## 2014-11-26 NOTE — Assessment & Plan Note (Signed)
-   Doing well, keeping follow up appointments with OB/GYN.

## 2014-11-26 NOTE — Assessment & Plan Note (Signed)
-   Patient currently supratheraptic due to misunderstanding of how to take medication.  His has been clarified and I have changed her med list to reflect the 5mg  pills she is taking. - She is still having pain at the clot site.  I advised that this pain may last for some time but it also may still be in the acute phase.  The important thing is to continue A/C.  I have also given her a temporary limited supply of percocet for pain.

## 2014-11-26 NOTE — Assessment & Plan Note (Signed)
-   Instructed patient on stretching exercises

## 2014-11-28 ENCOUNTER — Ambulatory Visit (INDEPENDENT_AMBULATORY_CARE_PROVIDER_SITE_OTHER): Payer: Self-pay | Admitting: Pharmacist

## 2014-11-28 DIAGNOSIS — I82409 Acute embolism and thrombosis of unspecified deep veins of unspecified lower extremity: Secondary | ICD-10-CM

## 2014-11-28 DIAGNOSIS — Z7901 Long term (current) use of anticoagulants: Secondary | ICD-10-CM

## 2014-11-28 DIAGNOSIS — I2699 Other pulmonary embolism without acute cor pulmonale: Secondary | ICD-10-CM

## 2014-11-28 LAB — POCT INR: INR: 2.2

## 2014-11-28 NOTE — Progress Notes (Signed)
Internal Medicine Clinic Attending  Case discussed with Dr. Hoffman soon after the resident saw the patient.  We reviewed the resident's history and exam and pertinent patient test results.  I agree with the assessment, diagnosis, and plan of care documented in the resident's note. 

## 2014-11-28 NOTE — Patient Instructions (Signed)
Patient instructed to take medications as defined in the Anti-coagulation Track section of this encounter.  Patient instructed to take today's dose.  Patient verbalized understanding of these instructions.    

## 2014-11-28 NOTE — Progress Notes (Signed)
Anti-Coagulation Progress Note  Kara Mcclain is a 44 y.o. female who is currently on an anti-coagulation regimen.    RECENT RESULTS: Recent results are below, the most recent result is correlated with a dose of 5mg  daily since Tuesday of this week (Monday's dose was held for INR = 4.2). Lab Results  Component Value Date   INR 2.20 11/28/2014   INR 4.2 11/24/2014   INR 3.2 11/18/2014    ANTI-COAG DOSE: Anticoagulation Dose Instructions as of 11/28/2014      Glynis SmilesSun Mon Tue Wed Thu Fri Sat   New Dose 10 mg 0 mg 5 mg 5 mg 5 mg 10 mg 7.5 mg       ANTICOAG SUMMARY: Anticoagulation Episode Summary    Current INR goal 2.0-3.0  Next INR check 12/01/2014  INR from last check 2.20 (11/28/2014)  Weekly max dose   Target end date   INR check location [facility]  Preferred lab   Send INR reminders to    Indications  DVT lower extremity recurrent [I82.409] Recurrent pulmonary embolism [I26.99] Long term current use of anticoagulant therapy [Z79.01]        Comments         ANTICOAG TODAY: Anticoagulation Summary as of 11/28/2014    INR goal 2.0-3.0  Selected INR 2.20 (11/28/2014)  Next INR check 12/01/2014  Target end date    Indications  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  Patient instructed to take today's dose.  Patient verbalized understanding of these instructions.       FOLLOW-UP Return in 3 days (on 12/01/2014) for Follow up INR at 0945h.  Hulen LusterJames Ezrah Dembeck, III Pharm.D., CACP

## 2014-12-01 ENCOUNTER — Ambulatory Visit (INDEPENDENT_AMBULATORY_CARE_PROVIDER_SITE_OTHER): Payer: Self-pay | Admitting: Pharmacist

## 2014-12-01 DIAGNOSIS — I82409 Acute embolism and thrombosis of unspecified deep veins of unspecified lower extremity: Secondary | ICD-10-CM

## 2014-12-01 DIAGNOSIS — I2699 Other pulmonary embolism without acute cor pulmonale: Secondary | ICD-10-CM

## 2014-12-01 DIAGNOSIS — Z7901 Long term (current) use of anticoagulants: Secondary | ICD-10-CM

## 2014-12-01 LAB — POCT INR: INR: 2.1

## 2014-12-01 NOTE — Progress Notes (Signed)
Anti-Coagulation Progress Note  Kara Mcclain is a 44 y.o. female who is currently on an anti-coagulation regimen.    RECENT RESULTS: Recent results are below, the most recent result is correlated with a dose of 10mg  alternating with 7.5mg  for past 3 days.  Lab Results  Component Value Date   INR 2.10 12/01/2014   INR 2.20 11/28/2014   INR 4.2 11/24/2014    ANTI-COAG DOSE: Anticoagulation Dose Instructions as of 12/01/2014      Glynis SmilesSun Mon Tue Wed Thu Fri Sat   New Dose 10 mg 10 mg 7.5 mg 10 mg 10 mg 10 mg 7.5 mg       ANTICOAG SUMMARY: Anticoagulation Episode Summary    Current INR goal 2.0-3.0  Next INR check 12/15/2014  INR from last check 2.10 (12/01/2014)  Weekly max dose   Target end date   [facility]R check location [facility]  Preferred lab   Send INR reminders to    Indications  DVT lower extremity recurrent [I82.409] Recurrent pulmonary embolism [I26.99] Long term current use of anticoagulant therapy [Z79.01]        Comments         ANTICOAG TODAY: Anticoagulation Summary as of 12/01/2014    INR goal 2.0-3.0  Selected INR 2.10 (12/01/2014)  Next INR check 12/15/2014  Target end date    Indications  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  Patient instructed to take today's dose.  Patient verbalized understanding of these instructions.       FOLLOW-UP Return in 2 weeks (on 12/15/2014) for Follow up INR at 1100h.  Hulen LusterJames Giuseppina Quinones, III Pharm.D., CACP

## 2014-12-01 NOTE — Patient Instructions (Signed)
Patient instructed to take medications as defined in the Anti-coagulation Track section of this encounter.  Patient instructed to take today's dose.  Patient verbalized understanding of these instructions.    

## 2014-12-01 NOTE — Progress Notes (Signed)
Indication: Recurrent venous thromboembolism. Duration: Lifelong. INR: At target. Agree with Dr. Groce's assessment and plan. 

## 2014-12-15 ENCOUNTER — Ambulatory Visit: Payer: Self-pay

## 2014-12-16 ENCOUNTER — Other Ambulatory Visit: Payer: Self-pay | Admitting: *Deleted

## 2014-12-16 MED ORDER — OXYCODONE-ACETAMINOPHEN 5-325 MG PO TABS: 1.0000 | ORAL_TABLET | Freq: Three times a day (TID) | ORAL | Status: DC | PRN

## 2014-12-17 NOTE — Telephone Encounter (Signed)
Pt aware.

## 2014-12-22 ENCOUNTER — Ambulatory Visit: Payer: Self-pay

## 2014-12-23 ENCOUNTER — Other Ambulatory Visit: Payer: Self-pay | Admitting: *Deleted

## 2014-12-24 MED ORDER — WARFARIN SODIUM 5 MG PO TABS: 5.0000 mg | ORAL_TABLET | Freq: Every day | ORAL | Status: DC

## 2014-12-24 NOTE — Telephone Encounter (Signed)
Called to pharm 

## 2014-12-29 ENCOUNTER — Ambulatory Visit (INDEPENDENT_AMBULATORY_CARE_PROVIDER_SITE_OTHER): Payer: Self-pay | Admitting: Pharmacist

## 2014-12-29 DIAGNOSIS — Z7901 Long term (current) use of anticoagulants: Secondary | ICD-10-CM

## 2014-12-29 DIAGNOSIS — I2699 Other pulmonary embolism without acute cor pulmonale: Secondary | ICD-10-CM

## 2014-12-29 DIAGNOSIS — I82409 Acute embolism and thrombosis of unspecified deep veins of unspecified lower extremity: Secondary | ICD-10-CM

## 2014-12-29 LAB — POCT INR: INR: 3.6

## 2014-12-29 MED ORDER — WARFARIN SODIUM 5 MG PO TABS: ORAL_TABLET | ORAL | Status: DC

## 2014-12-29 NOTE — Progress Notes (Signed)
Anti-Coagulation Progress Note  Kara Mcclain is a 44 y.o. female who is currently on an anti-coagulation regimen.    RECENT RESULTS: Recent results are below, the most recent result is correlated with a dose of 65 mg. per week: Lab Results  Component Value Date   INR 3.6 12/29/2014   INR 2.10 12/01/2014   INR 2.20 11/28/2014    ANTI-COAG DOSE: Anticoagulation Dose Instructions as of 12/29/2014      Glynis SmilesSun Mon Tue Wed Thu Fri Sat   New Dose 7.5 mg 7.5 mg 10 mg 7.5 mg 7.5 mg 10 mg 7.5 mg       ANTICOAG SUMMARY: Anticoagulation Episode Summary    Current INR goal 2.0-3.0  Next INR check 01/05/2015  INR from last check 3.6! 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  Patient instructed to take today's dose.  Patient verbalized understanding of these instructions.       FOLLOW-UP Return in 1 week (on 01/05/2015) for Follow up INR at 2:15pm.  Hulen LusterJames Micaella Gitto, III Pharm.D., CACP

## 2014-12-29 NOTE — Patient Instructions (Signed)
Patient instructed to take medications as defined in the Anti-coagulation Track section of this encounter.  Patient instructed to take today's dose.  Patient verbalized understanding of these instructions.    

## 2014-12-29 NOTE — Addendum Note (Signed)
Addended by: Hulen LusterGROCE, Krithik Mapel B on: 12/29/2014 03:41 PM   Modules accepted: Orders

## 2015-01-05 ENCOUNTER — Ambulatory Visit: Payer: Self-pay

## 2015-01-26 ENCOUNTER — Ambulatory Visit (INDEPENDENT_AMBULATORY_CARE_PROVIDER_SITE_OTHER): Payer: Self-pay | Admitting: Pharmacist

## 2015-01-26 DIAGNOSIS — I82409 Acute embolism and thrombosis of unspecified deep veins of unspecified lower extremity: Secondary | ICD-10-CM

## 2015-01-26 DIAGNOSIS — I82509 Chronic embolism and thrombosis of unspecified deep veins of unspecified lower extremity: Secondary | ICD-10-CM

## 2015-01-26 DIAGNOSIS — I2699 Other pulmonary embolism without acute cor pulmonale: Secondary | ICD-10-CM

## 2015-01-26 DIAGNOSIS — Z7901 Long term (current) use of anticoagulants: Secondary | ICD-10-CM

## 2015-01-26 DIAGNOSIS — I2782 Chronic pulmonary embolism: Secondary | ICD-10-CM

## 2015-01-26 LAB — POCT INR: INR: 1.8

## 2015-01-26 NOTE — Patient Instructions (Signed)
Patient instructed to take medications as defined in the Anti-coagulation Track section of this encounter.  Patient instructed to take today's dose.  Patient verbalized understanding of these instructions.    

## 2015-01-26 NOTE — Progress Notes (Signed)
Anti-Coagulation Progress Note  Kara Mcclain is a 44 y.o. female who is currently on an anti-coagulation regimen.    RECENT RESULTS: Recent results are below, the most recent result is correlated with a dose of 57.5 mg. per week: Lab Results  Component Value Date   INR 1.80 01/26/2015   INR 3.6 12/29/2014   INR 2.10 12/01/2014    ANTI-COAG DOSE: Anticoagulation Dose Instructions as of 01/26/2015      Glynis SmilesSun Mon Tue Wed Thu Fri Sat   New Dose 7.5 mg 12.5 mg 10 mg 7.5 mg 7.5 mg 10 mg 7.5 mg       ANTICOAG SUMMARY: Anticoagulation Episode Summary    Current INR goal 2.0-3.0  Next INR check 02/09/2015  INR from last check 1.80! 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  Patient instructed to take today's dose.  Patient verbalized understanding of these instructions.       FOLLOW-UP Return in 2 weeks (on 02/09/2015) for Follow up INR at 4PM.  Hulen LusterJames Lielle Vandervort, III Pharm.D., CACP

## 2015-02-09 ENCOUNTER — Ambulatory Visit (INDEPENDENT_AMBULATORY_CARE_PROVIDER_SITE_OTHER): Payer: Self-pay | Admitting: Pharmacist

## 2015-02-09 DIAGNOSIS — Z7901 Long term (current) use of anticoagulants: Secondary | ICD-10-CM

## 2015-02-09 DIAGNOSIS — I82409 Acute embolism and thrombosis of unspecified deep veins of unspecified lower extremity: Secondary | ICD-10-CM

## 2015-02-09 DIAGNOSIS — I2699 Other pulmonary embolism without acute cor pulmonale: Secondary | ICD-10-CM

## 2015-02-09 LAB — POCT INR: INR: 2

## 2015-02-09 NOTE — Patient Instructions (Signed)
Patient instructed to take medications as defined in the Anti-coagulation Track section of this encounter.  Patient instructed to take today's dose.  Patient verbalized understanding of these instructions.    

## 2015-02-09 NOTE — Progress Notes (Signed)
Anti-Coagulation Progress Note  Kara Mcclain is a 44 y.o. female who is currently on an anti-coagulation regimen.    RECENT RESULTS: Recent results are below, the most recent result is correlated with a dose of 62.5 mg. 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] Long term current use of anticoagulant therapy [Z79.01]        Comments         ANTICOAG TODAY: Anticoagulation Summary as of 02/09/2015    INR goal 2.0-3.0  Selected INR 2.00 (02/09/2015)  Next INR check 02/23/2015  Target end date    Indications  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  Patient instructed to take today's dose.  Patient verbalized understanding of these instructions.       FOLLOW-UP Return in 2 weeks (on 02/23/2015) for Follow up INR at 4:15PM.  Hulen LusterJames Fantasha Daniele, III Pharm.D., CACP

## 2015-02-11 ENCOUNTER — Encounter: Payer: Self-pay | Admitting: Obstetrics & Gynecology

## 2015-02-11 ENCOUNTER — Ambulatory Visit (INDEPENDENT_AMBULATORY_CARE_PROVIDER_SITE_OTHER): Payer: Self-pay | Admitting: Obstetrics & Gynecology

## 2015-02-11 VITALS — BP 132/76 | HR 69 | Temp 98.8°F | Resp 20 | Ht 71.0 in | Wt 245.0 lb

## 2015-02-11 DIAGNOSIS — Z9889 Other specified postprocedural states: Secondary | ICD-10-CM

## 2015-02-11 DIAGNOSIS — Z30011 Encounter for initial prescription of contraceptive pills: Secondary | ICD-10-CM

## 2015-02-11 MED ORDER — NORETHINDRONE 0.35 MG PO TABS: 1.0000 | ORAL_TABLET | Freq: Every day | ORAL | Status: DC

## 2015-02-11 NOTE — Progress Notes (Signed)
Subjective:     Patient ID: Kara Mcclain, female   DOB: 16-May-1971, 44 y.o.   MRN: [id number]  HPI Pt s/p right salpingectomy for ectopic pregnancy.  She reports that she has been doing well.  She denies problems. She reports being sexually active with condoms only.  She has a h/o DVT and cannot use EES.  Review of Systems     Objective:   Physical Exam BP 132/76 mmHg  Pulse 69  Temp(Src) 98.8 F (37.1 C) (Oral)  Resp 20  Ht 5\' 11"  (1.803 m)  Wt 245 lb (111.131 kg)  BMI 34.19 kg/m2  LMP 11/03/2014  Breastfeeding? Unknown Pt in NAD Abd; soft, NT; ND.  Port sites well healed  11/16/2014 Diagnosis Fallopian tube, right - BENIGN FALLOPIAN TUBE WITH FINDINGS CONSISTENT WITH ECTOPIC PREGNANCY. - NO ATYPIA OR TUMOR SEEN. - SEE COMMENT.    Assessment:     3 month post op check Contraception counseling     Plan:     Micronor 1 po q day F/u at the HD for LnIUD or Depo Provera Info given for free PAP screen

## 2015-02-11 NOTE — Patient Instructions (Addendum)
Etonogestrel implant What is this medicine? ETONOGESTREL (et oh noe JES trel) is a contraceptive (birth control) device. It is used to prevent pregnancy. It can be used for up to 3 years. This medicine may be used for other purposes; ask your health care provider or pharmacist if you have questions. COMMON BRAND NAME(S): Implanon, Nexplanon What should I tell my health care provider before I take this medicine? They need to know if you have any of these conditions: -abnormal vaginal bleeding -blood vessel disease or blood clots -cancer of the breast, cervix, or liver -depression -diabetes -gallbladder disease -headaches -heart disease or recent heart attack -high blood pressure -high cholesterol -kidney disease -liver disease -renal disease -seizures -tobacco smoker -an unusual or allergic reaction to etonogestrel, other hormones, anesthetics or antiseptics, medicines, foods, dyes, or preservatives -pregnant or trying to get pregnant -breast-feeding How should I use this medicine? This device is inserted just under the skin on the inner side of your upper arm by a health care professional. Talk to your pediatrician regarding the use of this medicine in children. Special care may be needed. Overdosage: If you think you've taken too much of this medicine contact a poison control center or emergency room at once. Overdosage: If you think you have taken too much of this medicine contact a poison control center or emergency room at once. NOTE: This medicine is only for you. Do not share this medicine with others. What if I miss a dose? This does not apply. What may interact with this medicine? Do not take this medicine with any of the following medications: -amprenavir -bosentan -fosamprenavir This medicine may also interact with the following medications: -barbiturate medicines for inducing sleep or treating seizures -certain medicines for fungal infections like ketoconazole and  itraconazole -griseofulvin -medicines to treat seizures like carbamazepine, felbamate, oxcarbazepine, phenytoin, topiramate -modafinil -phenylbutazone -rifampin -some medicines to treat HIV infection like atazanavir, indinavir, lopinavir, nelfinavir, tipranavir, ritonavir -St. John's wort This list may not describe all possible interactions. Give your health care provider a list of all the medicines, herbs, non-prescription drugs, or dietary supplements you use. Also tell them if you smoke, drink alcohol, or use illegal drugs. Some items may interact with your medicine. What should I watch for while using this medicine? This product does not protect you against HIV infection (AIDS) or other sexually transmitted diseases. You should be able to feel the implant by pressing your fingertips over the skin where it was inserted. Tell your doctor if you cannot feel the implant. What side effects may I notice from receiving this medicine? Side effects that you should report to your doctor or health care professional as soon as possible: -allergic reactions like skin rash, itching or hives, swelling of the face, lips, or tongue -breast lumps -changes in vision -confusion, trouble speaking or understanding -dark urine -depressed mood -general ill feeling or flu-like symptoms -light-colored stools -loss of appetite, nausea -right upper belly pain -severe headaches -severe pain, swelling, or tenderness in the abdomen -shortness of breath, chest pain, swelling in a leg -signs of pregnancy -sudden numbness or weakness of the face, arm or leg -trouble walking, dizziness, loss of balance or coordination -unusual vaginal bleeding, discharge -unusually weak or tired -yellowing of the eyes or skin Side effects that usually do not require medical attention (Report these to your doctor or health care professional if they continue or are bothersome.): -acne -breast pain -changes in  weight -cough -fever or chills -headache -irregular menstrual bleeding -itching, burning, and   vaginal discharge -pain or difficulty passing urine -sore throat This list may not describe all possible side effects. Call your doctor for medical advice about side effects. You may report side effects to FDA at 1-800-FDA-1088. Where should I keep my medicine? This drug is given in a hospital or clinic and will not be stored at home. NOTE: This sheet is a summary. It may not cover all possible information. If you have questions about this medicine, talk to your doctor, pharmacist, or health care provider.  2015, Elsevier/Gold Standard. (2012-05-14 15:37:45) Levonorgestrel intrauterine device (IUD) What is this medicine? LEVONORGESTREL IUD (LEE voe nor jes trel) is a contraceptive (birth control) device. The device is placed inside the uterus by a healthcare professional. It is used to prevent pregnancy and can also be used to treat heavy bleeding that occurs during your period. Depending on the device, it can be used for 3 to 5 years. This medicine may be used for other purposes; ask your health care provider or pharmacist if you have questions. COMMON BRAND NAME(S): Verda Cumins What should I tell my health care provider before I take this medicine? They need to know if you have any of these conditions: -abnormal Pap smear -cancer of the breast, uterus, or cervix -diabetes -endometritis -genital or pelvic infection now or in the past -have more than one sexual partner or your partner has more than one partner -heart disease -history of an ectopic or tubal pregnancy -immune system problems -IUD in place -liver disease or tumor -problems with blood clots or take blood-thinners -use intravenous drugs -uterus of unusual shape -vaginal bleeding that has not been explained -an unusual or allergic reaction to levonorgestrel, other hormones, silicone, or polyethylene, medicines,  foods, dyes, or preservatives -pregnant or trying to get pregnant -breast-feeding How should I use this medicine? This device is placed inside the uterus by a health care professional. Talk to your pediatrician regarding the use of this medicine in children. Special care may be needed. Overdosage: If you think you have taken too much of this medicine contact a poison control center or emergency room at once. NOTE: This medicine is only for you. Do not share this medicine with others. What if I miss a dose? This does not apply. What may interact with this medicine? Do not take this medicine with any of the following medications: -amprenavir -bosentan -fosamprenavir This medicine may also interact with the following medications: -aprepitant -barbiturate medicines for inducing sleep or treating seizures -bexarotene -griseofulvin -medicines to treat seizures like carbamazepine, ethotoin, felbamate, oxcarbazepine, phenytoin, topiramate -modafinil -pioglitazone -rifabutin -rifampin -rifapentine -some medicines to treat HIV infection like atazanavir, indinavir, lopinavir, nelfinavir, tipranavir, ritonavir -St. John's wort -warfarin This list may not describe all possible interactions. Give your health care provider a list of all the medicines, herbs, non-prescription drugs, or dietary supplements you use. Also tell them if you smoke, drink alcohol, or use illegal drugs. Some items may interact with your medicine. What should I watch for while using this medicine? Visit your doctor or health care professional for regular check ups. See your doctor if you or your partner has sexual contact with others, becomes HIV positive, or gets a sexual transmitted disease. This product does not protect you against HIV infection (AIDS) or other sexually transmitted diseases. You can check the placement of the IUD yourself by reaching up to the top of your vagina with clean fingers to feel the threads. Do  not pull on the threads. It is a good  habit to check placement after each menstrual period. Call your doctor right away if you feel more of the IUD than just the threads or if you cannot feel the threads at all. The IUD may come out by itself. You may become pregnant if the device comes out. If you notice that the IUD has come out use a backup birth control method like condoms and call your health care provider. Using tampons will not change the position of the IUD and are okay to use during your period. What side effects may I notice from receiving this medicine? Side effects that you should report to your doctor or health care professional as soon as possible: -allergic reactions like skin rash, itching or hives, swelling of the face, lips, or tongue -fever, flu-like symptoms -genital sores -high blood pressure -no menstrual period for 6 weeks during use -pain, swelling, warmth in the leg -pelvic pain or tenderness -severe or sudden headache -signs of pregnancy -stomach cramping -sudden shortness of breath -trouble with balance, talking, or walking -unusual vaginal bleeding, discharge -yellowing of the eyes or skin Side effects that usually do not require medical attention (report to your doctor or health care professional if they continue or are bothersome): -acne -breast pain -change in sex drive or performance -changes in weight -cramping, dizziness, or faintness while the device is being inserted -headache -irregular menstrual bleeding within first 3 to 6 months of use -nausea This list may not describe all possible side effects. Call your doctor for medical advice about side effects. You may report side effects to FDA at 1-800-FDA-1088. Where should I keep my medicine? This does not apply. NOTE: This sheet is a summary. It may not cover all possible information. If you have questions about this medicine, talk to your doctor, pharmacist, or health care provider.  2015,  [employer]vier/Gold Standard. (2011-12-08 13:54:04) Medroxyprogesterone injection [Contraceptive] What is this medicine? MEDROXYPROGESTERONE (me DROX ee proe JES te rone) contraceptive injections prevent pregnancy. They provide effective birth control for 3 months. Depo-subQ Provera 104 is also used for treating pain related to endometriosis. This medicine may be used for other purposes; ask your health care provider or pharmacist if you have questions. COMMON BRAND NAME(S): Depo-Provera, Depo-subQ Provera 104 What should I tell my health care provider before I take this medicine? They need to know if you have any of these conditions: -frequently drink alcohol -asthma -blood vessel disease or a history of a blood clot in the lungs or legs -bone disease such as osteoporosis -breast cancer -diabetes -eating disorder (anorexia nervosa or bulimia) -high blood pressure -HIV infection or AIDS -kidney disease -liver disease -mental depression -migraine -seizures (convulsions) -stroke -tobacco smoker -vaginal bleeding -an unusual or allergic reaction to medroxyprogesterone, other hormones, medicines, foods, dyes, or preservatives -pregnant or trying to get pregnant -breast-feeding How should I use this medicine? Depo-Provera Contraceptive injection is given into a muscle. Depo-subQ Provera 104 injection is given under the skin. These injections are given by a health care professional. You must not be pregnant before getting an injection. The injection is usually given during the first 5 days after the start of a menstrual period or 6 weeks after delivery of a baby. Talk to your pediatrician regarding the use of this medicine in children. Special care may be needed. These injections have been used in female children who have started having menstrual periods. Overdosage: If you think you have taken too much of this medicine contact a poison control center or emergency room at  once. NOTE: This  medicine is only for you. Do not share this medicine with others. What if I miss a dose? Try not to miss a dose. You must get an injection once every 3 months to maintain birth control. If you cannot keep an appointment, call and reschedule it. If you wait longer than 13 weeks between Depo-Provera contraceptive injections or longer than 14 weeks between Depo-subQ Provera 104 injections, you could get pregnant. Use another method for birth control if you miss your appointment. You may also need a pregnancy test before receiving another injection. What may interact with this medicine? Do not take this medicine with any of the following medications: -bosentan This medicine may also interact with the following medications: -aminoglutethimide -antibiotics or medicines for infections, especially rifampin, rifabutin, rifapentine, and griseofulvin -aprepitant -barbiturate medicines such as phenobarbital or primidone -bexarotene -carbamazepine -medicines for seizures like ethotoin, felbamate, oxcarbazepine, phenytoin, topiramate -modafinil -St. John's wort This list may not describe all possible interactions. Give your health care provider a list of all the medicines, herbs, non-prescription drugs, or dietary supplements you use. Also tell them if you smoke, drink alcohol, or use illegal drugs. Some items may interact with your medicine. What should I watch for while using this medicine? This drug does not protect you against HIV infection (AIDS) or other sexually transmitted diseases. Use of this product may cause you to lose calcium from your bones. Loss of calcium may cause weak bones (osteoporosis). Only use this product for more than 2 years if other forms of birth control are not right for you. The longer you use this product for birth control the more likely you will be at risk for weak bones. Ask your health care professional how you can keep strong bones. You may have a change in bleeding pattern  or irregular periods. Many females stop having periods while taking this drug. If you have received your injections on time, your chance of being pregnant is very low. If you think you may be pregnant, see your health care professional as soon as possible. Tell your health care professional if you want to get pregnant within the next year. The effect of this medicine may last a long time after you get your last injection. What side effects may I notice from receiving this medicine? Side effects that you should report to your doctor or health care professional as soon as possible: -allergic reactions like skin rash, itching or hives, swelling of the face, lips, or tongue -breast tenderness or discharge -breathing problems -changes in vision -depression -feeling faint or lightheaded, falls -fever -pain in the abdomen, chest, groin, or leg -problems with balance, talking, walking -unusually weak or tired -yellowing of the eyes or skin Side effects that usually do not require medical attention (report to your doctor or health care professional if they continue or are bothersome): -acne -fluid retention and swelling -headache -irregular periods, spotting, or absent periods -temporary pain, itching, or skin reaction at site where injected -weight gain This list may not describe all possible side effects. Call your doctor for medical advice about side effects. You may report side effects to FDA at 1-800-FDA-1088. Where should I keep my medicine? This does not apply. The injection will be given to you by a health care professional. NOTE: This sheet is a summary. It may not cover all possible information. If you have questions about this medicine, talk to your doctor, pharmacist, or health care provider.  2015, Elsevier/Gold Standard. (2008-11-28 18:37:56)

## 2015-02-11 NOTE — Progress Notes (Signed)
Indication: Recurrent venous thromboembolism.  Duration: Lifelong.  INR at target.  Agree with Dr. Saralyn PilarGroce's assessment and plan as documented.

## 2015-03-30 ENCOUNTER — Ambulatory Visit (INDEPENDENT_AMBULATORY_CARE_PROVIDER_SITE_OTHER): Payer: Medicaid Other | Admitting: Pharmacist

## 2015-03-30 DIAGNOSIS — I2699 Other pulmonary embolism without acute cor pulmonale: Secondary | ICD-10-CM

## 2015-03-30 DIAGNOSIS — I82409 Acute embolism and thrombosis of unspecified deep veins of unspecified lower extremity: Secondary | ICD-10-CM | POA: Diagnosis not present

## 2015-03-30 DIAGNOSIS — Z7901 Long term (current) use of anticoagulants: Secondary | ICD-10-CM

## 2015-03-30 LAB — POCT INR: INR: 2.4

## 2015-03-30 NOTE — Patient Instructions (Signed)
Patient instructed to take medications as defined in the Anti-coagulation Track section of this encounter.  Patient instructed to take today's dose.  Patient verbalized understanding of these instructions.    

## 2015-03-30 NOTE — Progress Notes (Signed)
Anti-Coagulation Progress Note  Kara Mcclain is a 44 y.o. female who is currently on an anti-coagulation regimen.    RECENT RESULTS: Recent results are below, the most recent result is correlated with a dose of 70 mg. 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] Recurrent pulmonary embolism [I26.99] Long term current use of anticoagulant therapy [Z79.01]        Comments         ANTICOAG TODAY: Anticoagulation Summary as of 03/30/2015    INR goal 2.0-3.0  Selected INR 2.40 (03/30/2015)  Next INR check 04/13/2015  Target end date    Indications  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  Patient instructed to take today's dose.  Patient verbalized understanding of these instructions.       FOLLOW-UP Return in 2 weeks (on 04/13/2015) for Follow up INR at 4:15PM.  Hulen LusterJames Alane Hanssen, III Pharm.D., CACP

## 2015-03-31 NOTE — Progress Notes (Signed)
INTERNAL MEDICINE TEACHING ATTENDING ADDENDUM - Earl LagosNischal Ashan Cueva M.D  Duration- indefinitte, Indication- DVT, recurrent PE, INR- therapeutic. Agree with pharmacy recommendations as outlined in their note.

## 2015-04-06 ENCOUNTER — Encounter: Payer: Self-pay | Admitting: *Deleted

## 2015-04-09 ENCOUNTER — Telehealth: Payer: Self-pay | Admitting: Pharmacist

## 2015-04-09 NOTE — Telephone Encounter (Signed)
Call to patient to confirm appointment for 04/13/15 at 4:30 lmtcb

## 2015-04-13 ENCOUNTER — Ambulatory Visit (INDEPENDENT_AMBULATORY_CARE_PROVIDER_SITE_OTHER): Payer: Self-pay | Admitting: Pharmacist

## 2015-04-13 DIAGNOSIS — I82409 Acute embolism and thrombosis of unspecified deep veins of unspecified lower extremity: Secondary | ICD-10-CM

## 2015-04-13 DIAGNOSIS — I2699 Other pulmonary embolism without acute cor pulmonale: Secondary | ICD-10-CM

## 2015-04-13 DIAGNOSIS — Z7901 Long term (current) use of anticoagulants: Secondary | ICD-10-CM

## 2015-04-13 LAB — POCT INR: INR: 4.8

## 2015-04-13 MED ORDER — WARFARIN SODIUM 5 MG PO TABS: ORAL_TABLET | ORAL | Status: DC

## 2015-04-13 NOTE — Patient Instructions (Signed)
Patient instructed to take medications as defined in the Anti-coagulation Track section of this encounter.  Patient instructed to hold today's and tomorrow's dose.  Patient verbalized understanding of these instructions.

## 2015-04-13 NOTE — Progress Notes (Signed)
Anti-Coagulation Progress Note  Kara Mcclain is a 44 y.o. female who is currently on an anti-coagulation regimen.  RECENT RESULTS:  Recent results are below, the most recent result is correlated with a dose of 70 mg. per week:  Lab Results  Component Value Date   INR 4.8 04/13/2015   INR 2.40 03/30/2015   INR 2.00 02/09/2015    ANTI-COAG DOSE:  Anticoagulation Dose Instructions as of 04/13/2015      Glynis SmilesSun Mon Tue Wed Thu Fri Sat   New Dose 10 mg 5 mg 10 mg 10 mg 5 mg 10 mg 10 mg    Description        Take 1 tablet on Mon and Thurs and 2 tablets on all other days.      ANTICOAG SUMMARY:  Anticoagulation Episode Summary    Current INR goal 2.0-3.0  Next INR check 04/22/2015  INR from last check 4.8! 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  Patient instructed to hold today's and tomorrow's dose.  Patient verbalized understanding of these instructions.        FOLLOW-UP  Return in 3 weeks (on 05/04/2015) for Follow up INR @ 3pm.    Thank you for allowing pharmacy to be part of this patient's care team  Terrie Haring M. Bently Morath,  Pharm.D Clinical Pharmacy Resident Pager: [phone] 04/13/2015 .4:52 PM

## 2015-04-22 ENCOUNTER — Ambulatory Visit (INDEPENDENT_AMBULATORY_CARE_PROVIDER_SITE_OTHER): Payer: Self-pay | Admitting: Pharmacist

## 2015-04-22 DIAGNOSIS — I82409 Acute embolism and thrombosis of unspecified deep veins of unspecified lower extremity: Secondary | ICD-10-CM

## 2015-04-22 DIAGNOSIS — Z7901 Long term (current) use of anticoagulants: Secondary | ICD-10-CM

## 2015-04-22 DIAGNOSIS — I82403 Acute embolism and thrombosis of unspecified deep veins of lower extremity, bilateral: Secondary | ICD-10-CM

## 2015-04-22 DIAGNOSIS — I2699 Other pulmonary embolism without acute cor pulmonale: Secondary | ICD-10-CM

## 2015-04-22 LAB — POCT INR: INR: 1.7

## 2015-04-22 NOTE — Patient Instructions (Signed)
Patient educated about medication as defined in this encounter and verbalized understanding by repeating back instructions provided.   

## 2015-04-22 NOTE — Progress Notes (Signed)
CLINICAL PHARMACIST ANTICOAG NOTE Kara Mcclain is a 44 y.o. female who reports to the clinic for monitoring of warfarin treatment.    Indication: History of recurrent DVT, PE Duration: indefinite  Anticoagulation Clinic Visit History: Anticoagulation Episode Summary    Current INR goal 2.0-3.0  Next INR check 04/29/2015  INR from last check 1.7! 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] Long term current use of anticoagulant therapy [Z79.01]        Comments        ASSESSMENT Recent Results: Recent results are below, the most recent result is correlated with a dose of 60 mg per week: Lab Results  Component Value Date   INR 1.7 04/22/2015   INR 4.8 04/13/2015   INR 2.40 03/30/2015    INR today: Subtherapeutic  Anticoagulation Dosing: INR as of 04/22/2015 and Previous Dosing Information    INR Dt INR Goal Wkly Tot Sun Mon Tue Wed Thu Fri Sat   04/22/2015 1.7 2.0-3.0 60 mg 10 mg 5 mg 10 mg 10 mg 5 mg 10 mg 10 mg    Previous description        Take 1 tablet on Mon and Thurs and 2 tablets on all other days.    Anticoagulation Dose Instructions as of 04/22/2015      Total Sun Mon Tue Wed Thu Fri Sat   New Dose 70 mg 10 mg 10 mg 10 mg 10 mg 10 mg 10 mg 10 mg     (5 mg x 2)  (5 mg x 2)  (5 mg x 2)  (5 mg x 2)  (5 mg x 2)  (5 mg x 2)  (5 mg x 2)                           PLAN Weekly dose was increased by 16% to 70 mg per week  Patient Instructions  Patient educated about medication as defined in this encounter and verbalized understanding by repeating back instructions provided.    Follow-up Return in about 1 week (around 04/29/2015) for 04/29/2015 Follow up INR 3pm.  Kara Mcclain Clinical Pharmacist  20 minutes spent face-to-face with the patient during the encounter. 50% of time spent  on education. 50% of time was spent on assessment and plan.

## 2015-04-28 ENCOUNTER — Telehealth: Payer: Self-pay | Admitting: Pharmacist

## 2015-04-28 NOTE — Telephone Encounter (Signed)
Call to patient to confirm appointment for 04/29/15 at 2:30 vm is full

## 2015-04-29 ENCOUNTER — Ambulatory Visit: Payer: Self-pay | Admitting: Pharmacist

## 2015-05-18 ENCOUNTER — Ambulatory Visit (INDEPENDENT_AMBULATORY_CARE_PROVIDER_SITE_OTHER): Payer: Self-pay | Admitting: Pharmacist

## 2015-05-18 DIAGNOSIS — I2699 Other pulmonary embolism without acute cor pulmonale: Secondary | ICD-10-CM

## 2015-05-18 DIAGNOSIS — Z7901 Long term (current) use of anticoagulants: Secondary | ICD-10-CM

## 2015-05-18 DIAGNOSIS — I82409 Acute embolism and thrombosis of unspecified deep veins of unspecified lower extremity: Secondary | ICD-10-CM

## 2015-05-18 LAB — POCT INR: INR: 2.4

## 2015-05-18 NOTE — Progress Notes (Signed)
Anti-Coagulation Progress Note  Kara Mcclain is a 44 y.o. female who is currently on an anti-coagulation regimen.    RECENT RESULTS: Recent results are below, the most recent result is correlated with a dose of 70 mg. per week:  Discussed with Dr. Rogelia BogaButcher. In absence of swelling of her left lower extremity, do dyspnea or chest pain and in setting of therapeutic INR we will continue upon warfarin. Discussed with patient that if any swelling of her left lower extremity were to occur or if she were to develop shortness of breath, chest-pain, coughing up blood, increased pulse-rate, etc. That she would need to call clinic or go to the ED. She verbalized understanding of these instructions citing "..I trust your judgement". I reinforced making certain that she does not have an interruption of warfarin and should she have another episode of inability to pay for her warfarin to contact me to discuss how we could get her warfarin during any interval of awaiting her pay-check for her own ability to purchase her warfarin at her pharmacy.  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] Long term current use of anticoagulant therapy [Z79.01]        Comments         ANTICOAG TODAY: Anticoagulation Summary as of 05/18/2015    INR goal 2.0-3.0  Selected INR 2.40 (05/18/2015)  Next INR check 06/01/2015  Target end date    Indications  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  Patient instructed to take today's dose.  Patient verbalized understanding of these instructions.       FOLLOW-UP Return in 2 weeks (on 06/01/2015) for Follow up INR at 3:45PM.  Hulen LusterJames Yuvan Medinger, III Pharm.D., CACP

## 2015-05-18 NOTE — Patient Instructions (Signed)
Patient instructed to take medications as defined in the Anti-coagulation Track section of this encounter.  Patient instructed to take today's dose.  Patient verbalized understanding of these instructions.    

## 2015-05-27 ENCOUNTER — Encounter: Payer: Self-pay | Admitting: Internal Medicine

## 2015-05-27 ENCOUNTER — Ambulatory Visit (INDEPENDENT_AMBULATORY_CARE_PROVIDER_SITE_OTHER): Payer: Self-pay | Admitting: Internal Medicine

## 2015-05-27 VITALS — BP 123/70 | HR 75 | Temp 97.7°F | Ht 71.0 in | Wt 260.4 lb

## 2015-05-27 DIAGNOSIS — R21 Rash and other nonspecific skin eruption: Secondary | ICD-10-CM

## 2015-05-27 DIAGNOSIS — I82502 Chronic embolism and thrombosis of unspecified deep veins of left lower extremity: Secondary | ICD-10-CM

## 2015-05-27 DIAGNOSIS — Z7901 Long term (current) use of anticoagulants: Secondary | ICD-10-CM

## 2015-05-27 DIAGNOSIS — I82503 Chronic embolism and thrombosis of unspecified deep veins of lower extremity, bilateral: Secondary | ICD-10-CM

## 2015-05-27 DIAGNOSIS — I82409 Acute embolism and thrombosis of unspecified deep veins of unspecified lower extremity: Secondary | ICD-10-CM

## 2015-05-27 DIAGNOSIS — I82402 Acute embolism and thrombosis of unspecified deep veins of left lower extremity: Secondary | ICD-10-CM

## 2015-05-27 LAB — D-DIMER, QUANTITATIVE: D-Dimer, Quant: 0.38 ug/mL-FEU (ref 0.00–0.48)

## 2015-05-27 LAB — POCT INR: INR: 2.9

## 2015-05-27 MED ORDER — DICLOFENAC SODIUM 1 % TD GEL: 2.0000 g | Freq: Four times a day (QID) | TRANSDERMAL | Status: DC

## 2015-05-27 NOTE — Assessment & Plan Note (Signed)
Please follow up with coumadin clinic to monitor INR

## 2015-05-27 NOTE — Progress Notes (Signed)
Patient ID: [name], female   DOB: 1971-01-19, 44 y.o.   MRN: [id number]   Subjective:   Patient ID: Kara Mcclain female   DOB: [birth year] 44 y.o.   MRN: [id number]  HPI: [name] is a 44 y.o. 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 She is here for complaint of left lower leg pain for about 10 days (since Saturday June 25), which has not gotten better. She had subsequently visited the coumadin clinic here on that Monday the 27th and her INR was found to be therapeutic. The pain gets better with elevation of leg but on walking and during daytime the pain is quite severe- and ranges from 5-9 out of 10. She feels a knot on the posterior calf. This is the leg where she had her 1st DVT in 2014. The following year, she had DVT in the right leg.  She denies any chest pain, or dyspnea, and denies smoking. She has been taking tylenol to relieve her pain but it is only temporary. She takes 10 mg coumadin on all days except M and Th on which she takes 12.5- the dose was recently adjusted. She has family history of DVTs in both her mother and sister. Her sister had some genetic test done which came negative. She does not recall the name of the test.   In the clinic today, the INR was found to be 2.9, and D Dimer test was ordered.    Past Medical History  Diagnosis Date  . Pulmonary embolism   . DVT (deep venous thrombosis)     "LLE" (07/04/2013)  . Chest pain     2/2 related to PE with component of anxiety versus pericarditis  Improves with tylenol prn a few times a week EKG with long QTc and nonspecific t wave changes. 2d echo 08/23/2013 - normal  08/26/2013 referred to cards for evaluation Lipid check ordered.    Current Outpatient Prescriptions  Medication Sig Dispense Refill  . acetaminophen  (TYLENOL) 325 MG tablet Take 650 mg by mouth every 6 (six) hours as needed.    . docusate sodium (COLACE) 100 MG capsule Take 1 capsule (100 mg total) by mouth daily as needed. 30 capsule 1  . warfarin (COUMADIN) 5 MG tablet Take 1 tablets on Mondays and Thursdays and 2 tablets on all other days. 52 tablet 1  . norethindrone (MICRONOR,CAMILA,ERRIN) 0.35 MG tablet Take 1 tablet (0.35 mg total) by mouth daily. (Patient not taking: Reported on 05/27/2015) 1 Package 11  . oxyCODONE-acetaminophen (PERCOCET/ROXICET) 5-325 MG per tablet Take 1 tablet by mouth every 8 (eight) hours as needed for severe pain (moderate to severe pain (when tolerating fluids)). (Patient not taking: Reported on 05/27/2015) 30 tablet 0   No current facility-administered medications for this visit.   Family History  Problem Relation Age of Onset  . Pulmonary embolism Mother   . Deep vein thrombosis Mother   . Cancer Mother   . Hypertension Mother   . Stroke Father   . Gout Sister   . Diabetes Sister   . Hypertension Sister    History   Social History  . Marital Status: Married    Spouse Name: N/A  . Number of Children: N/A  . Years of Education: N/A   Social History Main Topics  . Smoking status: Never Smoker   .  Smokeless tobacco: Never Used  . Alcohol Use: 1.8 oz/week    3 Shots of liquor per week     Comment: sometimes  . Drug Use: No     Comment: 07/04/2013 "last marijuana ~ 3 wks ago"  . Sexual Activity: Yes    Birth Control/ Protection: Condom   Other Topics Concern  . None   Social History Narrative   Review of Systems: Review of Systems  Constitutional: Negative.  Negative for malaise/fatigue.  Eyes: Negative for blurred vision.  Respiratory: Negative for cough, shortness of breath and wheezing.   Cardiovascular: Positive for leg swelling. Negative for chest pain and palpitations.  Gastrointestinal: Negative for nausea.  Neurological: Negative for dizziness and headaches.  Endo/Heme/Allergies:  Does not bruise/bleed easily.    Pertinent items are noted in HPI. Objective:  Physical Exam: Filed Vitals:   05/27/15 1520  BP: 123/70  Pulse: 75  Temp: 97.7 F (36.5 C)  TempSrc: Oral  Height: 5\' 11"  (1.803 m)  Weight: 260 lb 6.4 oz (118.117 kg)  SpO2: 100%  Physical Exam  Constitutional: She appears well-developed and well-nourished.  Neck: Normal range of motion. Neck supple.  Cardiovascular: Normal rate, regular rhythm and intact distal pulses.   No murmur heard. Pulmonary/Chest: Effort normal and breath sounds normal. No respiratory distress.  Musculoskeletal:  MSK/Vascular: Left lower leg near the ankle and calf, exquisitely tender to palpation. + Homan's sign  The diameter of both calf were measured and were about the same  No edema of the lower extremities, and intact dorsalis pedis and posterior tibial pulses     Assessment & Plan:  Please see problem based charting for assessment and plan I spent approximately 35 minutes with this patient.

## 2015-05-27 NOTE — Assessment & Plan Note (Signed)
Recurrent History of DVT. Pt is on long term coagulation on coumadin. And was hospitalized in 2014 and 2015. DVT of the same leg was in 2014, then the right leg in 2015. Currently, pain in the left leg with tenderness to palpation and positive homans sign, and FH of DVT.  INR was repeated in clinic today and was 2.9. D-Dimer was well within normal limits.   Given all of the above and that the patient is doing well, recurrent DVT is less likely, and so we will hold off on doing ultrasound of the leg.  Pain is likely due to superficial thrombophlebitis.  Pt is advised to use NSAIDs like ibuprofen for pain, tylenol, warm compresses, and voltaren gel   Pt is advised to skip coumadin tomorrow, and resume the current dose, and follow up in the coumadin clinic. Pt is advised to come back or go to the ER if she notices any further swelling or increase in pain, or chest pain/dyspnea.

## 2015-05-27 NOTE — Patient Instructions (Signed)
Please go to the ER if your symptoms worsen, or if you experience shortness of breath or chest pain.  Your INR today in the clinic was 2.9 and therapeutic.  Your D-dimer test was within normal limits  Please use ibuprofen or tylenol for pain Please use warm compresses  Please use the voltaren gel over the affected site.  Please return to clinic if your symptoms are not resolved within a week. Please visit coumadin clinic as scheduled.    Please SKIP tomorrow's coumadin and resume current dose, and follow up with Dr. Michaell CowingGross

## 2015-05-27 NOTE — Progress Notes (Signed)
Internal Medicine Clinic Attending  I saw and evaluated the patient.  I personally confirmed the key portions of the history and exam documented by Dr. Deneise LeverParth Saraiya and I reviewed pertinent patient test results.  The assessment, diagnosis, and plan were formulated together and I agree with the documentation in the resident's note.  44 year old female with past medical history of bilateral DVT and PE on lifelong anticoagulation with coumadin comes in with 10 days of unrelenting left calf pain. Her last INR was 2.4 on 05/18/15. On exam - pulses present, left mid calf tenderness, no swelling, palpated tender induration - no chord. Could be superficial phlebitis, however will repeat INR, and do a stat D-Dimer. If D-Dimer is positive, will ask the patient to go to ER to get doppler done. If DVT positive on doppler, she might need change of anticoagulant given warfarin failure. If D-dimer is negative, this is likely superficial thrombophlebitis, would advise symptomatic care - NSAIDs oral and gel, compresses. If not relieved by compresses, would consider ABI.    Aletta EdouardShilpa Tomoya Ringwald MD MPH 05/27/2015 4:21 PM

## 2015-05-29 MED ORDER — DICLOFENAC SODIUM 1 % TD GEL: 2.0000 g | Freq: Four times a day (QID) | TRANSDERMAL | Status: DC

## 2015-05-29 MED ORDER — CAPSAICIN 0.025 % EX GEL: 0.1000 [oz_av] | CUTANEOUS | Status: DC | PRN

## 2015-05-29 NOTE — Addendum Note (Signed)
Addended by: Deneise LeverSARAIYA, [place] Lozito A on: 05/29/2015 04:02 PM   Modules accepted: Orders

## 2015-06-01 ENCOUNTER — Ambulatory Visit (INDEPENDENT_AMBULATORY_CARE_PROVIDER_SITE_OTHER): Payer: Self-pay | Admitting: Pharmacist

## 2015-06-01 DIAGNOSIS — I82409 Acute embolism and thrombosis of unspecified deep veins of unspecified lower extremity: Secondary | ICD-10-CM

## 2015-06-01 DIAGNOSIS — Z7901 Long term (current) use of anticoagulants: Secondary | ICD-10-CM

## 2015-06-01 DIAGNOSIS — I2699 Other pulmonary embolism without acute cor pulmonale: Secondary | ICD-10-CM

## 2015-06-01 LAB — POCT INR: INR: 2.8

## 2015-06-01 NOTE — Patient Instructions (Signed)
Patient instructed to take medications as defined in the Anti-coagulation Track section of this encounter.  Patient instructed to take today's dose.  Patient verbalized understanding of these instructions.    

## 2015-06-01 NOTE — Progress Notes (Signed)
Anti-Coagulation Progress Note  Kara Mcclain is a [age] y.o. female who is currently on an anti-coagulation regimen.    RECENT RESULTS: Recent results are below, the most recent result is correlated with a dose of 75 mg. 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] Long term current use of anticoagulant therapy [Z79.01]        Comments         ANTICOAG TODAY: Anticoagulation Summary as of 06/01/2015    INR goal 2.0-3.0  Selected INR 2.80 (06/01/2015)  Next INR check 06/22/2015  Target end date    Indications  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  Patient instructed to take today's dose.  Patient verbalized understanding of these instructions.       FOLLOW-UP Return in 3 weeks (on 06/22/2015) for Follow up INR at 4:30PM.  Hulen LusterJames Declan Adamson, III Pharm.D., CACP

## 2015-06-04 ENCOUNTER — Other Ambulatory Visit: Payer: Self-pay | Admitting: *Deleted

## 2015-06-04 NOTE — Telephone Encounter (Signed)
Rx pt is requesting has been sent to pharmacy. Pt informed

## 2015-06-22 ENCOUNTER — Ambulatory Visit (INDEPENDENT_AMBULATORY_CARE_PROVIDER_SITE_OTHER): Payer: Self-pay | Admitting: Pharmacist

## 2015-06-22 DIAGNOSIS — Z7901 Long term (current) use of anticoagulants: Secondary | ICD-10-CM

## 2015-06-22 DIAGNOSIS — I2699 Other pulmonary embolism without acute cor pulmonale: Secondary | ICD-10-CM

## 2015-06-22 DIAGNOSIS — I82409 Acute embolism and thrombosis of unspecified deep veins of unspecified lower extremity: Secondary | ICD-10-CM

## 2015-06-22 LAB — POCT INR: INR: 5.6

## 2015-06-23 NOTE — Progress Notes (Signed)
Anti-Coagulation Progress Note  Kara Mcclain is a 44 y.o. female who is currently on an anti-coagulation regimen.    RECENT RESULTS: Recent results are below, the most recent result is correlated with a dose of 70 mg. per week:  One day's dose to be held on Monday 1-AUG-16, subsequent weekly dosing decreased from /wk to 62.5mg /wk.  Lab Results  Component Value Date   INR 5.60 06/22/2015   INR 2.80 06/01/2015   INR 2.9 05/27/2015    ANTI-COAG DOSE: Anticoagulation Dose Instructions as of 06/22/2015      [name] Tue Wed Thu Fri Sat   New Dose 10 mg 10 mg 7.5 mg 10 mg 7.5 mg 10 mg 7.5 mg    Description        Will OMIT Monday 1-AUG-16 dose.        ANTICOAG SUMMARY: Anticoagulation Episode Summary    Current INR goal 2.0-3.0  Next INR check 07/06/2015  INR from last check 5.60! 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  Patient instructed to OMIT dose for Monday 1-AUG-16.  Patient verbalized understanding of these instructions.       FOLLOW-UP Return in 2 weeks (on 07/06/2015) for Follow up INR at 4:30PM.  [name], III Pharm.D., CACP

## 2015-06-23 NOTE — Patient Instructions (Signed)
Patient instructed to take medications as defined in the Anti-coagulation Track section of this encounter.  Patient instructed to OMIT dose for Monday 1-AUG-16.  Patient verbalized understanding of these instructions.

## 2015-07-06 ENCOUNTER — Ambulatory Visit: Payer: Self-pay

## 2015-08-10 ENCOUNTER — Ambulatory Visit (INDEPENDENT_AMBULATORY_CARE_PROVIDER_SITE_OTHER): Payer: Self-pay | Admitting: Pharmacist

## 2015-08-10 DIAGNOSIS — I2699 Other pulmonary embolism without acute cor pulmonale: Secondary | ICD-10-CM

## 2015-08-10 DIAGNOSIS — Z7901 Long term (current) use of anticoagulants: Secondary | ICD-10-CM

## 2015-08-10 DIAGNOSIS — I82409 Acute embolism and thrombosis of unspecified deep veins of unspecified lower extremity: Secondary | ICD-10-CM

## 2015-08-10 LAB — POCT INR: INR: 2.8

## 2015-08-10 NOTE — Patient Instructions (Signed)
Patient instructed to take medications as defined in the Anti-coagulation Track section of this encounter.  Patient instructed to take today's dose.  Patient verbalized understanding of these instructions.    

## 2015-08-10 NOTE — Progress Notes (Signed)
Anti-Coagulation Progress Note  Kara Mcclain is a 44 y.o. female who is currently on an anti-coagulation regimen.    RECENT RESULTS: Recent results are below, the most recent result is correlated with a dose of 62.5 mg. 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 [I26.99] Long term current use of anticoagulant therapy [Z79.01]        Comments         ANTICOAG TODAY: Anticoagulation Summary as of 08/10/2015    INR goal 2.0-3.0  Selected INR 2.80 (08/10/2015)  Next INR check 09/07/2015  Target end date    Indications  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  Patient instructed to take today's dose.  Patient verbalized understanding of these instructions.       FOLLOW-UP Return in 4 weeks (on 09/07/2015) for Follow up INR at 4:15PM.  [name], III Pharm.D., CACP

## 2015-09-07 ENCOUNTER — Ambulatory Visit (INDEPENDENT_AMBULATORY_CARE_PROVIDER_SITE_OTHER): Payer: Self-pay | Admitting: Pharmacist

## 2015-09-07 DIAGNOSIS — I2699 Other pulmonary embolism without acute cor pulmonale: Secondary | ICD-10-CM

## 2015-09-07 DIAGNOSIS — I82409 Acute embolism and thrombosis of unspecified deep veins of unspecified lower extremity: Secondary | ICD-10-CM

## 2015-09-07 DIAGNOSIS — Z7901 Long term (current) use of anticoagulants: Secondary | ICD-10-CM

## 2015-09-07 LAB — POCT INR: INR: 3

## 2015-09-07 MED ORDER — WARFARIN SODIUM 5 MG PO TABS: ORAL_TABLET | ORAL | Status: DC

## 2015-09-07 NOTE — Progress Notes (Signed)
Anti-Coagulation Progress Note  Kara Mcclain is a 44 y.o. female who is currently on an anti-coagulation regimen.    RECENT RESULTS: Recent results are below, the most recent result is correlated with a dose of 60 mg. per week: Lab Results  Component Value Date   INR 3.0 09/07/2015   INR 2.80 08/10/2015   INR 5.60 06/22/2015    ANTI-COAG DOSE: Anticoagulation Dose Instructions as of 09/07/2015      Glynis SmilesSun Mon Tue Wed Thu Fri Sat   New Dose 7.5 mg 7.5 mg 7.5 mg 10 mg 7.5 mg 7.5 mg 7.5 mg       ANTICOAG SUMMARY: Anticoagulation Episode Summary    Current INR goal 2.0-3.0  Next INR check 10/05/2015  INR from last check 3.0 (09/07/2015)  Weekly max dose   Target end date   [facility] check location [facility]  Preferred lab   Send INR reminders to    Indications  DVT lower extremity recurrent (HCC) [I82.409] Recurrent pulmonary embolism (HCC) [I26.99] Long term current use of anticoagulant therapy [Z79.01]        Comments         ANTICOAG TODAY: Anticoagulation Summary as of 09/07/2015    INR goal 2.0-3.0  Selected INR 3.0 (09/07/2015)  Next INR check 10/05/2015  Target end date    Indications  DVT lower extremity recurrent (HCC) [I82.409] Recurrent pulmonary embolism (HCC) [I26.99] Long term current use of anticoagulant therapy [Z79.01]      Anticoagulation Episode Summary    [facility] check [facility]   Preferred lab    Send INR reminders to    Comments       PATIENT INSTRUCTIONS: Patient Instructions  Patient instructed to take medications as defined in the Anti-coagulation Track section of this encounter.  Patient instructed to take today's dose.  Patient verbalized understanding of these instructions.       FOLLOW-UP Return in 4 weeks (on 10/05/2015) for Follow up INR at 4:15PM.  Hulen LusterJames Shealyn Sean, III Pharm.D., CACP

## 2015-09-07 NOTE — Patient Instructions (Signed)
Patient instructed to take medications as defined in the Anti-coagulation Track section of this encounter.  Patient instructed to take today's dose.  Patient verbalized understanding of these instructions.    

## 2015-09-09 NOTE — Progress Notes (Signed)
INTERNAL MEDICINE TEACHING ATTENDING ADDENDUM - Jadelyn Elks M.D  Duration- indefinite, Indication- recurrent PE, INR- therapeutic. Agree with pharmacy recommendations as outlined in their note.      

## 2015-09-21 ENCOUNTER — Other Ambulatory Visit: Payer: Self-pay | Admitting: Internal Medicine

## 2015-10-03 ENCOUNTER — Other Ambulatory Visit: Payer: Self-pay | Admitting: Internal Medicine

## 2015-10-05 ENCOUNTER — Ambulatory Visit: Payer: Self-pay

## 2015-10-05 NOTE — Telephone Encounter (Signed)
This looks like a duplicate.

## 2015-11-30 ENCOUNTER — Ambulatory Visit (INDEPENDENT_AMBULATORY_CARE_PROVIDER_SITE_OTHER): Payer: Self-pay | Admitting: Pharmacist

## 2015-11-30 DIAGNOSIS — Z7901 Long term (current) use of anticoagulants: Secondary | ICD-10-CM

## 2015-11-30 DIAGNOSIS — I2699 Other pulmonary embolism without acute cor pulmonale: Secondary | ICD-10-CM

## 2015-11-30 LAB — POCT INR: INR: 3.4

## 2015-11-30 NOTE — Progress Notes (Signed)
Anti-Coagulation Progress Note  Kara Mcclain is a 45 y.o. female who is currently on an anti-coagulation regimen.    RECENT RESULTS: Recent results are below, the most recent result is correlated with a dose of 55 mg. per week: Lab Results  Component Value Date   INR 3.4 11/30/2015   INR 3.0 09/07/2015   INR 2.80 08/10/2015    ANTI-COAG DOSE: Anticoagulation Dose Instructions as of 11/30/2015      Glynis SmilesSun Mon Tue Wed Thu Fri Sat   New Dose 7.5 mg 7.5 mg 7.5 mg 10 mg 7.5 mg 7.5 mg 7.5 mg       ANTICOAG SUMMARY: Anticoagulation Episode Summary    Current INR goal 2.0-3.0  Next INR check 12/21/2015  INR from last check 3.4! 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  Patient instructed to take today's dose.  Patient verbalized understanding of these instructions.       FOLLOW-UP Return in 3 weeks (on 12/21/2015) for INR at 3 PM.  Hulen LusterJames Jojo Geving, III Pharm.D., CACP

## 2015-11-30 NOTE — Patient Instructions (Signed)
Patient instructed to take medications as defined in the Anti-coagulation Track section of this encounter.  Patient instructed to take today's dose.  Patient verbalized understanding of these instructions.    

## 2015-12-01 NOTE — Progress Notes (Signed)
I have reviewed Dr. Saralyn PilarGroce's note.  [facility]  Her INR was elevated related to dietary changes.  Coumadin unchanged and recommended change to diet.

## 2015-12-21 ENCOUNTER — Ambulatory Visit: Payer: Self-pay

## 2016-06-20 ENCOUNTER — Ambulatory Visit (INDEPENDENT_AMBULATORY_CARE_PROVIDER_SITE_OTHER): Payer: Self-pay | Admitting: Internal Medicine

## 2016-06-20 ENCOUNTER — Ambulatory Visit (INDEPENDENT_AMBULATORY_CARE_PROVIDER_SITE_OTHER): Payer: Self-pay | Admitting: Pharmacist

## 2016-06-20 ENCOUNTER — Encounter: Payer: Self-pay | Admitting: Internal Medicine

## 2016-06-20 VITALS — BP 121/84 | HR 81 | Temp 98.2°F | Ht 71.0 in | Wt 259.5 lb

## 2016-06-20 DIAGNOSIS — Z7901 Long term (current) use of anticoagulants: Secondary | ICD-10-CM

## 2016-06-20 DIAGNOSIS — I87002 Postthrombotic syndrome without complications of left lower extremity: Secondary | ICD-10-CM

## 2016-06-20 DIAGNOSIS — I87009 Postthrombotic syndrome without complications of unspecified extremity: Secondary | ICD-10-CM

## 2016-06-20 DIAGNOSIS — I82403 Acute embolism and thrombosis of unspecified deep veins of lower extremity, bilateral: Secondary | ICD-10-CM

## 2016-06-20 DIAGNOSIS — Z0000 Encounter for general adult medical examination without abnormal findings: Secondary | ICD-10-CM

## 2016-06-20 DIAGNOSIS — I2699 Other pulmonary embolism without acute cor pulmonale: Secondary | ICD-10-CM

## 2016-06-20 LAB — POCT INR: INR: 4.2

## 2016-06-20 NOTE — Patient Instructions (Addendum)
Thank you for your visit today Please follow up in 3 months Please use compression stockings, elevate legs at night, and use tylenol as needed.

## 2016-06-20 NOTE — Assessment & Plan Note (Addendum)
Pt is here today for continuing pain and off and on swelling mainly in the left lower ankle. SHe says that she has pain ever since she had VTE in the left leg in 2014. She subsequently had another VTE in Right LE. She has been on Coumadin ever since then and follows in our clinic. SHe has been therapeutic on the coumadin. Her pain is not present in the morning but over the course of the day, her ankle will get swollen. On exam, there is no pitting edema. Left ankle is tender to palpation and mild swelling present bilaterally.   Patient seen with Dr. Oswaldo Done. We discussed that there is not really any good treatment for this. We advised her moderate exercise that may help her pain. ALso compression stockings, and elevation of the legs will help. TYlenol PRN. We can refer her to vascular/IR for further management if these measures do not help. ALso discussed that horse chestnut seed extract may help in post-phlebitic pain. She will RTC in 3 months.

## 2016-06-20 NOTE — Patient Instructions (Signed)
Patient educated about medication as defined in this encounter and verbalized understanding by repeating back instructions provided.   

## 2016-06-20 NOTE — Assessment & Plan Note (Signed)
-  Patient is unsure whether she received a pap smear at [facility] hospital where she received OBGYN care. She says she will let us know  -SHe will follow up in 3 months

## 2016-06-20 NOTE — Assessment & Plan Note (Signed)
Patient has been therapeutic on Coumadin. She expressed that the costs of the NOACs may be high as she is self-pay.  Patient qualifies for Xarelto free  -Obtain INR today -will switch to Xarelto

## 2016-06-20 NOTE — Progress Notes (Signed)
   CC: leg pain HPI: Kara Mcclain is a 45 y.o. woman with history of recurrent DVTs here for visit regarding her leg pain, and INR check.  Please see Problem List/A&P for the status of the patient's chronic medical problems   Past Medical History:  Diagnosis Date  . Chest pain    2/2 related to PE with component of anxiety versus pericarditis  Improves with tylenol prn a few times a week EKG with long QTc and nonspecific t wave changes. 2d echo 08/23/2013 - normal  08/26/2013 referred to cards for evaluation Lipid check ordered.   Marland Kitchen DVT (deep venous thrombosis) (HCC)    "LLE" (07/04/2013)  . Pulmonary embolism (HCC)     Review of Systems:  Constitutional: Negative for fever, chills, weight loss and malaise/fatigue.  Resp: neg for dyspnea Cardiovascular: Negative for chest pain Extremities: periodic leg swelling and pain  Physical Exam: Vitals:   06/20/16 1454  BP: 121/84  Pulse: 81  Temp: 98.2 F (36.8 C)  TempSrc: Oral  SpO2: 100%  Weight: 259 lb 8 oz (117.7 kg)  Height: 5\' 11"  (1.803 m)    General: A&O, in NAD CV: RRR, normal s1, s2, no m/r/g,  Resp: equal and symmetric breath sounds, no wheezing or crackles  Abdomen: soft, nontender, nondistended, +BS Extremities: pulses intact b/l, trace bilateral ankle edema, no pitting edema of the legs    Assessment & Plan:   See encounters tab for problem based medical decision making. Patient seen with Dr. Oswaldo Done

## 2016-06-20 NOTE — Progress Notes (Signed)
Anticoagulation Management Kara Mcclain is a 45 y.o. female who reports to the clinic for monitoring of warfarin treatment.    Indication: DVT and PE Duration: indefinite  Anticoagulation Clinic Visit History: Patient does not report signs/symptoms of bleeding or thromboembolism. She does report a reduction in dietary vitamin K intake Anticoagulation Episode Summary    Current INR goal:   2.0-3.0  TTR:   42.5 % (2.9 y)  Next INR check:   06/27/2016  INR from last check:   4.2! 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) [I26.99] Long term current use of anticoagulant therapy [Z79.01]       Comments:          ASSESSMENT Recent Results: The most recent result is correlated with 75 mg per week: Lab Results  Component Value Date   INR 4.2 06/20/2016   INR 3.4 11/30/2015   INR 3.0 09/07/2015    Anticoagulation Dosing: INR as of 06/20/2016 and Previous Dosing Information    INR Dt INR Goal [facility] Sun Mon Tue Wed Thu Fri Sat   06/20/2016 4.2 2.0-3.0 55 mg 7.5 mg 7.5 mg 7.5 mg 10 mg 7.5 mg 7.5 mg 7.5 mg    Anticoagulation Dose Instructions as of 06/20/2016      Total Sun Mon Tue Wed Thu Fri Sat   New Dose 45 mg 7.5 mg 5 mg 5 mg 7.5 mg 5 mg 7.5 mg 7.5 mg     (5 mg x 1.5)  (5 mg x 1)  (5 mg x 1)  (5 mg x 1.5)  (5 mg x 1)  (5 mg x 1.5)  (5 mg x 1.5)                         Description   Patient requests switch to Xarelto     INR today: Supratherapeutic  PLAN Weekly dose was decreased by 18% to 45 mg per week. Patient is interested in switching to rivaroxaban (Xarelto) for which no clinical contraindications to therapy are currently present. Based on financial insight provided by patient, she would qualify for free access to Xarelto through the patient assistance program.  Will discuss with PCP and if approved, will plan to switch patient to Xarelto at next visit.  Patient Instructions  Patient educated about medication as defined in this encounter and verbalized understanding by repeating back instructions provided.   Patient advised to contact clinic or seek medical attention if signs/symptoms of bleeding or thromboembolism occur.  Patient verbalized understanding by repeating back information and was advised to contact me if further medication-related questions arise. Patient was also provided an information handout.  Follow-up Return in about 1 week (around 06/27/2016) for Follow up INR 06/27/2016 around 3pm.  [name],[name]  15 minutes spent face-to-face with the patient during the encounter. 50% of time spent on education. 50% of time was spent on assessment and plan.

## 2016-06-21 LAB — BMP8+ANION GAP
Anion Gap: 14 mmol/L (ref 10.0–18.0)
BUN / CREAT RATIO: 13 (ref 9–23)
BUN: 8 mg/dL (ref 6–24)
CHLORIDE: 102 mmol/L (ref 96–106)
CO2: 21 mmol/L (ref 18–29)
Calcium: 8.9 mg/dL (ref 8.7–10.2)
Creatinine, Ser: 0.61 mg/dL (ref 0.57–1.00)
GFR, EST AFRICAN AMERICAN: 128 mL/min/{1.73_m2} (ref >59)
GFR, EST NON AFRICAN AMERICAN: 111 mL/min/{1.73_m2} (ref >59)
Glucose: 84 mg/dL (ref 65–99)
POTASSIUM: 4 mmol/L (ref 3.5–5.2)
SODIUM: 137 mmol/L (ref 134–144)

## 2016-06-22 NOTE — Progress Notes (Signed)
INTERNAL MEDICINE TEACHING ATTENDING ADDENDUM - Lalla Brothers M.D  Duration- indefinite, Indication- recurrent VTE, INR- supratherapeutic. Agree with pharmacy recommendations as outlined in their note.

## 2016-06-22 NOTE — Progress Notes (Signed)
Internal Medicine Clinic Attending  I saw and evaluated the patient.  I personally confirmed the key portions of the history and exam documented by Dr. Saraiya and I reviewed pertinent patient test results.  The assessment, diagnosis, and plan were formulated together and I agree with the documentation in the resident's note.  

## 2016-06-27 ENCOUNTER — Ambulatory Visit: Payer: Self-pay

## 2016-06-28 ENCOUNTER — Ambulatory Visit (INDEPENDENT_AMBULATORY_CARE_PROVIDER_SITE_OTHER): Payer: Self-pay | Admitting: Pharmacist

## 2016-06-28 DIAGNOSIS — Z7901 Long term (current) use of anticoagulants: Secondary | ICD-10-CM

## 2016-06-28 DIAGNOSIS — I2699 Other pulmonary embolism without acute cor pulmonale: Secondary | ICD-10-CM

## 2016-06-28 LAB — POCT INR: INR: 1.5

## 2016-06-28 MED ORDER — RIVAROXABAN 20 MG PO TABS: 20.0000 mg | ORAL_TABLET | Freq: Every day | ORAL | 6 refills | Status: DC

## 2016-06-28 NOTE — Patient Instructions (Signed)
Rivaroxaban oral tablets What is this medicine? RIVAROXABAN (ri va ROX a ban) is an anticoagulant (blood thinner). It is used to treat blood clots in the lungs or in the veins. It is also used after knee or hip surgeries to prevent blood clots. It is also used to lower the chance of stroke in people with a medical condition called atrial fibrillation. This medicine may be used for other purposes; ask your health care provider or pharmacist if you have questions. What should I tell my health care provider before I take this medicine? They need to know if you have any of these conditions: -bleeding disorders -bleeding in the brain -blood in your stools (black or tarry stools) or if you have blood in your vomit -history of stomach bleeding -kidney disease -liver disease -low blood counts, like low white cell, platelet, or red cell counts -recent or planned spinal or epidural procedure -take medicines that treat or prevent blood clots -an unusual or allergic reaction to rivaroxaban, other medicines, foods, dyes, or preservatives -pregnant or trying to get pregnant -breast-feeding How should I use this medicine? Take this medicine by mouth with a glass of water. Follow the directions on the prescription label. Take your medicine at regular intervals. Do not take it more often than directed. Do not stop taking except on your doctor's advice. Stopping this medicine may increase your risk of a blood clot. Be sure to refill your prescription before you run out of medicine. If you are taking this medicine after hip or knee replacement surgery, take it with or without food. If you are taking this medicine for atrial fibrillation, take it with your evening meal. If you are taking this medicine to treat blood clots, take it with food at the same time each day. If you are unable to swallow your tablet, you may crush the tablet and mix it in applesauce. Then, immediately eat the applesauce. You should eat more  food right after you eat the applesauce containing the crushed tablet. Talk to your pediatrician regarding the use of this medicine in children. Special care may be needed. Overdosage: If you think you have taken too much of this medicine contact a poison control center or emergency room at once. NOTE: This medicine is only for you. Do not share this medicine with others. What if I miss a dose? If you take your medicine once a day and miss a dose, take the missed dose as soon as you remember. If you take your medicine twice a day and miss a dose, take the missed dose immediately. In this instance, 2 tablets may be taken at the same time. The next day you should take 1 tablet twice a day as directed. What may interact with this medicine? -aspirin and aspirin-like medicines -certain antibiotics like erythromycin, azithromycin, and clarithromycin -certain medicines for fungal infections like ketoconazole and itraconazole -certain medicines for irregular heart beat like amiodarone, quinidine, dronedarone -certain medicines for seizures like carbamazepine, phenytoin -certain medicines that treat or prevent blood clots like warfarin, enoxaparin, and dalteparin -conivaptan -diltiazem -felodipine -indinavir -lopinavir; ritonavir -NSAIDS, medicines for pain and inflammation, like ibuprofen or naproxen -ranolazine -rifampin -ritonavir -St. John's wort -verapamil This list may not describe all possible interactions. Give your health care provider a list of all the medicines, herbs, non-prescription drugs, or dietary supplements you use. Also tell them if you smoke, drink alcohol, or use illegal drugs. Some items may interact with your medicine. What should I watch for while using this   medicine? Visit your doctor or health care professional for regular checks on your progress. Your condition will be monitored carefully while you are receiving this medicine. Notify your doctor or health care  professional and seek emergency treatment if you develop breathing problems; changes in vision; chest pain; severe, sudden headache; pain, swelling, warmth in the leg; trouble speaking; sudden numbness or weakness of the face, arm, or leg. These can be signs that your condition has gotten worse. If you are going to have surgery, tell your doctor or health care professional that you are taking this medicine. Tell your health care professional that you use this medicine before you have a spinal or epidural procedure. Sometimes people who take this medicine have bleeding problems around the spine when they have a spinal or epidural procedure. This bleeding is very rare. If you have a spinal or epidural procedure while on this medicine, call your health care professional immediately if you have back pain, numbness or tingling (especially in your legs and feet), muscle weakness, paralysis, or loss of bladder or bowel control. Avoid sports and activities that might cause injury while you are using this medicine. Severe falls or injuries can cause unseen bleeding. Be careful when using sharp tools or knives. Consider using an electric razor. Take special care brushing or flossing your teeth. Report any injuries, bruising, or red spots on the skin to your doctor or health care professional. What side effects may I notice from receiving this medicine? Side effects that you should report to your doctor or health care professional as soon as possible: -allergic reactions like skin rash, itching or hives, swelling of the face, lips, or tongue -back pain -redness, blistering, peeling or loosening of the skin, including inside the mouth -signs and symptoms of bleeding such as bloody or black, tarry stools; red or dark-brown urine; spitting up blood or brown material that looks like coffee grounds; red spots on the skin; unusual bruising or bleeding from the eye, gums, or nose Side effects that usually do not require  medical attention (Report these to your doctor or health care professional if they continue or are bothersome.): -dizziness -muscle pain This list may not describe all possible side effects. Call your doctor for medical advice about side effects. You may report side effects to FDA at 1-800-FDA-1088. Where should I keep my medicine? Keep out of the reach of children. Store at room temperature between 15 and 30 degrees C (59 and 86 degrees F). Throw away any unused medicine after the expiration date. NOTE: This sheet is a summary. It may not cover all possible information. If you have questions about this medicine, talk to your doctor, pharmacist, or health care provider.    2016, Elsevier/Gold Standard. (2014-11-05 12:45:34)  

## 2016-06-28 NOTE — Progress Notes (Signed)
CLINICAL PHARMACY ANTICOAGULATION TRANSITION NOTE Kara Mcclain is a 45 y.o. female who is currently on an anti-coagulation regimen for h/o recurrent PE. Patient reports that she has a family history of clots, and that family is doing well on DOACs. She is being transitioned from warfarin to rivaroxaban due to labile INR and affordability.   RECENT RESULTS: Lab Results  Component Value Date   INR 1.5 06/28/2016   INR 4.2 06/20/2016   INR 3.4 11/30/2015   ANTI-COAG DOSE:   ANTICOAG SUMMARY: Anticoagulation Episode Summary    Current INR goal:   2.0-3.0  TTR:   42.5 % (2.9 y)  Next INR check:   06/27/2016  INR from last check:   4.2! (06/20/2016)  Most recent INR:    1.5! 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   ASSESSMENT Indication(s): h/o recurrent PE Duration: indefinite  Labs:    Component Value Date/Time   CREATININE 0.61 06/20/2016 1528   GFRNONAA 111 06/20/2016 1528   GFRAA 128 06/20/2016 1528    The following has been considered to transition the patient to a direct oral anticoagulant:   [x]  Contraindications to direct oral anticoagulants [x]  Renal dysfunction for dose adjustments [x]  Hepatic dysfunction for dose adjustments [x]  Drug-drug interactions [x]  Drug-disease interactions [x]  Financial barriers - patient was signed up for Patient Assistance Program to receive Xarelto from manufacturer for no cost.  Adherence: Patient has no known adherence challenges.   Safety: Patient reports no recent signs or symptoms of bleeding. Patient reports no signs of symptoms of  thrombosis.      Recommendations:   Warfarin will be discontinued today, as INR is < 2.     Transition to new oral anticoagulant rivaroxaban (Xarelto) 20 mg po QD with breakfast (patient preference for largest and most consistent meal of the day).  Monitoring: 6 months, telephone monitoring and refills   PLAN Implement changes as recommended above Xarelto 20mg  will be started tomorrow morning with breakfast Patient has not received a savings card, but was given 28 day supply of samples and is enrolled in an assistance program with the manufacturer.     Follow-up Telephone follow-up in 6 months, or as identified by patient.  Eilene GhaziApryl N Anderson, PharmD Clinical Pharmacist 06/28/2016, 5:03 PM

## 2016-07-15 ENCOUNTER — Telehealth: Payer: Self-pay

## 2016-07-15 NOTE — Telephone Encounter (Signed)
Requesting Xarelto to be filled @ [facility] on [address].

## 2016-08-08 NOTE — Telephone Encounter (Signed)
See documentation in all other encounter forms.

## 2016-08-08 NOTE — Telephone Encounter (Signed)
See documentation in encounter forms and within www.doseresponse.com

## 2016-09-27 ENCOUNTER — Telehealth: Payer: Self-pay

## 2016-09-27 NOTE — Telephone Encounter (Signed)
Kara Mcclain iSi Gauls a 45 y.o. female who was contacted via telephone for monitoring of rivaroxaban (Xarelto) therapy.    ASSESSMENT Indication(s): DVT and PE  Duration: indefinite  Labs:    Component Value Date/Time   AST 10 11/10/2014 2230   ALT 8 11/10/2014 1220   NA 137 06/20/2016 1528   K 4.0 06/20/2016 1528   CL 102 06/20/2016 1528   CO2 21 06/20/2016 1528   GLUCOSE 84 06/20/2016 1528   GLUCOSE 98 11/10/2014 1220   BUN 8 06/20/2016 1528   CREATININE 0.61 06/20/2016 1528   CREATININE 0.69 07/08/2013 1435   CALCIUM 8.9 06/20/2016 1528   GFRNONAA 111 06/20/2016 1528   GFRNONAA >89 07/08/2013 1435   GFRAA 128 06/20/2016 1528   GFRAA >89 07/08/2013 1435   WBC 7.9 11/17/2014 0523   HGB 9.0 (L) 11/17/2014 0523   HCT 26.7 (L) 11/17/2014 0523   PLT 277 11/17/2014 0523    rivaroxaban (Xarelto) Dose: 20 mg daily   Safety: Patient has not had recent bleeding/thromboembolic events. Patient reports no recent signs or symptoms of bleeding, no signs of symptoms of thromboembolism. Medication changes: no.  Renal/hepatic/drug interaction concerns: no.  Adherence: Patient does correctly recite the dose. Patient reports no known adherence challenges. Contacted pharmacy and records indicate refills are consistent. Refill dates 08/26/16 (30 day supply), 07/20/16 (30 day supply).   Patient Instructions: Patient advised to contact clinic or seek medical attention if signs/symptoms of bleeding or thromboembolism occur. Patient verbalized understanding by repeating back information.  Follow-up Recommended labs to consider: CBC . Next appointment: Not Scheduled  Lorenso CourierFrank  Parrish Daddario PharmD Candidate  09/27/2016, 10:47 AM

## 2016-10-02 NOTE — Telephone Encounter (Signed)
Patient was contacted with Frank Tillman, PharmD candidate. I agree with the assessment and plan of care documented.  

## 2017-02-27 ENCOUNTER — Encounter: Payer: Self-pay | Admitting: Internal Medicine

## 2017-03-29 ENCOUNTER — Telehealth: Payer: Self-pay | Admitting: Internal Medicine

## 2017-03-29 DIAGNOSIS — I2699 Other pulmonary embolism without acute cor pulmonale: Secondary | ICD-10-CM

## 2017-03-29 NOTE — Telephone Encounter (Signed)
I am not able to refill this as pt has had several no-shows and letter sent to her.   Pt needs to call the clinic stating when she will come for an appointment before I can refill this.

## 2017-03-29 NOTE — Telephone Encounter (Signed)
Patient needs appt before med refill (Xarelto)

## 2017-04-05 NOTE — Telephone Encounter (Signed)
Attempted to contact patient today about scheduling an appt.  No answer. LMTCB.

## 2017-04-06 ENCOUNTER — Ambulatory Visit: Payer: Self-pay

## 2017-04-13 ENCOUNTER — Ambulatory Visit: Payer: Self-pay

## 2017-04-14 ENCOUNTER — Encounter: Payer: Self-pay | Admitting: Internal Medicine

## 2017-04-14 ENCOUNTER — Ambulatory Visit (INDEPENDENT_AMBULATORY_CARE_PROVIDER_SITE_OTHER): Payer: Self-pay | Admitting: Internal Medicine

## 2017-04-14 VITALS — BP 134/68 | HR 85 | Temp 98.2°F | Ht 71.0 in | Wt 249.7 lb

## 2017-04-14 DIAGNOSIS — I82503 Chronic embolism and thrombosis of unspecified deep veins of lower extremity, bilateral: Secondary | ICD-10-CM

## 2017-04-14 DIAGNOSIS — N920 Excessive and frequent menstruation with regular cycle: Secondary | ICD-10-CM

## 2017-04-14 DIAGNOSIS — D649 Anemia, unspecified: Secondary | ICD-10-CM

## 2017-04-14 DIAGNOSIS — Z7901 Long term (current) use of anticoagulants: Secondary | ICD-10-CM

## 2017-04-14 DIAGNOSIS — Z86711 Personal history of pulmonary embolism: Secondary | ICD-10-CM

## 2017-04-14 MED ORDER — RIVAROXABAN 20 MG PO TABS: 20.0000 mg | ORAL_TABLET | Freq: Every day | ORAL | 2 refills | Status: DC

## 2017-04-14 NOTE — Progress Notes (Signed)
Internal Medicine Clinic Attending  Case discussed with Dr. Ahmed at the time of the visit.  We reviewed the resident's history and exam and pertinent patient test results.  I agree with the assessment, diagnosis, and plan of care documented in the resident's note. 

## 2017-04-14 NOTE — Assessment & Plan Note (Addendum)
Has anemia based on last hgb 9 on 2015. No recent blood work. Having fatigue/tiredness likely related to heavy menstrual bleeding.  Will check CBC and ferritin today.   Addendum: hgb remains stable 9.5. Ferritin is low at 6. Will have her start taking ferrous sulfate once a day.

## 2017-04-14 NOTE — Assessment & Plan Note (Signed)
On life long anticaog due to PE and recurrent DVT. Having heavy bleeding from xarelto but did not have it on coumadin in the past.  Will continue xarelto for now. Will try to investigate why she is having menorrhagia. In the future may consider switching back to coumadin as she did fine with that.

## 2017-04-14 NOTE — Patient Instructions (Signed)
We will get blood work today to check your blood count  Continue xarelto for now.  We will get an ultrasound  and also refer you to women's health.

## 2017-04-14 NOTE — Progress Notes (Signed)
   CC: f/up for recurrent DVt/anticoag  HPI:  Kara Mcclain is a 46 y.o. 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 Chest pain    2/2 related to PE with component of anxiety versus pericarditis  Improves with tylenol prn a few times a week EKG with long QTc and nonspecific t wave changes. 2d echo 08/23/2013 - normal  08/26/2013 referred to cards for evaluation Lipid check ordered.   Marland Kitchen. DVT (deep venous thrombosis) (HCC)    "LLE" (07/04/2013)  . 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 PCP wanted her to come in for further refill of xarelto as she has missed her previous appts.  She is concerned about xarelto as she is having heavy bleeding on it. This month she had her regular period with heavy bleeding and then again had few days of menstrual bleeding. No GI bleed, no hemoptysis. Fells tired and fatigued, thinks could be from the bleeding. Did not have bleeding on coumadin like this. Asking for women's health referral.   Her leg swelling and pain is better now (from DVT). Wears compression stocking and keeps legs elevated. Also exercising more.   Review of Systems:   Review of Systems  Constitutional: Positive for malaise/fatigue. Negative for chills and fever.  Respiratory: Negative for cough and hemoptysis.   Cardiovascular: Negative for chest pain.  Gastrointestinal: Negative for abdominal pain, blood in stool, constipation, diarrhea, heartburn, melena, nausea and vomiting.  Genitourinary: Negative for flank pain and hematuria.  Neurological: Negative for dizziness and headaches.     Physical Exam:  Vitals:   04/14/17 1615  BP: 134/68  Pulse: 85  Temp: 98.2 F (36.8 C)  TempSrc: Oral  SpO2: 100%  Weight: 249 lb 11.2 oz (113.3 kg)  Height: 5\' 11"  (1.803 m)     Physical  Exam  Constitutional: She is oriented to person, place, and time. She appears well-developed and well-nourished. No distress.  HENT:  Head: Normocephalic and atraumatic.  Eyes: Conjunctivae are normal.  Cardiovascular: Exam reveals no gallop and no friction rub.   No murmur heard. Respiratory: Effort normal and breath sounds normal. No respiratory distress. She has no wheezes.  GI: Soft. Bowel sounds are normal. She exhibits no distension. There is no tenderness.  Musculoskeletal: Normal range of motion.  1+ pitting edema b/l on lower exts. No redness or warmth.  Neurological: She is alert and oriented to person, place, and time.  Skin: She is not diaphoretic.  Psychiatric: She has a normal mood and affect.    Assessment & Plan:   See Encounters Tab for problem based charting.  Patient discussed with Dr. Rogelia BogaButcher

## 2017-04-14 NOTE — Assessment & Plan Note (Signed)
Having menorrhagia since starting xarelto, periods are overall regular except this month had few days of spotting/mentrual bleeding 1 week after her mestruation was completed.   Will order transvaginal u/s to look for fibroids or any other pathologies Will refer to gyn Continue xarelto for now given her recurrent VTE's. Could consider switching to coumadin as she did not have menorrhagia on coumadin.

## 2017-04-15 LAB — FERRITIN: Ferritin: 6 ng/mL — ABNORMAL LOW (ref 15–150)

## 2017-04-15 LAB — CBC
HEMOGLOBIN: 9.4 g/dL — AB (ref 11.1–15.9)
Hematocrit: 29 % — ABNORMAL LOW (ref 34.0–46.6)
MCH: 25 pg — AB (ref 26.6–33.0)
MCHC: 32.4 g/dL (ref 31.5–35.7)
MCV: 77 fL — AB (ref 79–97)
Platelets: 436 10*3/uL — ABNORMAL HIGH (ref 150–379)
RBC: 3.76 x10E6/uL — AB (ref 3.77–5.28)
RDW: 16.2 % — ABNORMAL HIGH (ref 12.3–15.4)
WBC: 5.4 10*3/uL (ref 3.4–10.8)

## 2017-04-18 MED ORDER — FERROUS SULFATE 325 (65 FE) MG PO TBEC: 325.0000 mg | DELAYED_RELEASE_TABLET | Freq: Every day | ORAL | 0 refills | Status: DC

## 2017-04-21 ENCOUNTER — Ambulatory Visit: Payer: Self-pay

## 2017-04-25 ENCOUNTER — Encounter: Payer: Self-pay | Admitting: Internal Medicine

## 2017-04-28 NOTE — Addendum Note (Signed)
Addended by: Hyacinth MeekerAHMED, Cornellius Kropp on: 04/28/2017 10:53 AM   Modules accepted: Orders

## 2017-05-22 NOTE — Addendum Note (Signed)
Addended by: Neomia DearPOWERS, Nevaya Nagele E on: 05/22/2017 08:35 PM   Modules accepted: Orders

## 2017-12-20 ENCOUNTER — Encounter: Payer: Self-pay | Admitting: *Deleted

## 2018-02-07 ENCOUNTER — Ambulatory Visit: Payer: Self-pay

## 2018-02-07 ENCOUNTER — Ambulatory Visit: Payer: Self-pay | Admitting: Internal Medicine

## 2018-02-07 ENCOUNTER — Ambulatory Visit (HOSPITAL_COMMUNITY)
Admission: RE | Admit: 2018-02-07 | Discharge: 2018-02-07 | Disposition: A | Discharge status: Home / Self Care | Payer: Self-pay | Source: Ambulatory Visit | Attending: Student in an Organized Health Care Education/Training Program | Admitting: Student in an Organized Health Care Education/Training Program

## 2018-02-07 ENCOUNTER — Other Ambulatory Visit: Payer: Self-pay

## 2018-02-07 ENCOUNTER — Encounter: Payer: Self-pay | Admitting: Internal Medicine

## 2018-02-07 VITALS — BP 126/63 | HR 74 | Temp 98.2°F | Ht 71.0 in | Wt 243.6 lb

## 2018-02-07 DIAGNOSIS — Z5181 Encounter for therapeutic drug level monitoring: Secondary | ICD-10-CM

## 2018-02-07 DIAGNOSIS — I82403 Acute embolism and thrombosis of unspecified deep veins of lower extremity, bilateral: Secondary | ICD-10-CM

## 2018-02-07 DIAGNOSIS — Z86711 Personal history of pulmonary embolism: Secondary | ICD-10-CM

## 2018-02-07 DIAGNOSIS — M25861 Other specified joint disorders, right knee: Secondary | ICD-10-CM | POA: Insufficient documentation

## 2018-02-07 DIAGNOSIS — I824Z1 Acute embolism and thrombosis of unspecified deep veins of right distal lower extremity: Secondary | ICD-10-CM

## 2018-02-07 DIAGNOSIS — Z09 Encounter for follow-up examination after completed treatment for conditions other than malignant neoplasm: Secondary | ICD-10-CM | POA: Insufficient documentation

## 2018-02-07 DIAGNOSIS — D500 Iron deficiency anemia secondary to blood loss (chronic): Secondary | ICD-10-CM

## 2018-02-07 DIAGNOSIS — Z86718 Personal history of other venous thrombosis and embolism: Secondary | ICD-10-CM | POA: Insufficient documentation

## 2018-02-07 DIAGNOSIS — Z8742 Personal history of other diseases of the female genital tract: Secondary | ICD-10-CM

## 2018-02-07 DIAGNOSIS — R791 Abnormal coagulation profile: Secondary | ICD-10-CM

## 2018-02-07 DIAGNOSIS — D509 Iron deficiency anemia, unspecified: Secondary | ICD-10-CM

## 2018-02-07 DIAGNOSIS — L03115 Cellulitis of right lower limb: Secondary | ICD-10-CM | POA: Insufficient documentation

## 2018-02-07 DIAGNOSIS — N920 Excessive and frequent menstruation with regular cycle: Secondary | ICD-10-CM

## 2018-02-07 DIAGNOSIS — R590 Localized enlarged lymph nodes: Secondary | ICD-10-CM | POA: Insufficient documentation

## 2018-02-07 DIAGNOSIS — Z7901 Long term (current) use of anticoagulants: Secondary | ICD-10-CM

## 2018-02-07 DIAGNOSIS — Z79899 Other long term (current) drug therapy: Secondary | ICD-10-CM

## 2018-02-07 DIAGNOSIS — D649 Anemia, unspecified: Secondary | ICD-10-CM

## 2018-02-07 LAB — POCT INR: INR: 1.1

## 2018-02-07 MED ORDER — CEPHALEXIN 500 MG PO CAPS: 500.0000 mg | ORAL_CAPSULE | Freq: Four times a day (QID) | ORAL | 0 refills | Status: AC

## 2018-02-07 MED ORDER — WARFARIN SODIUM 5 MG PO TABS: 5.0000 mg | ORAL_TABLET | Freq: Every day | ORAL | 11 refills | Status: DC

## 2018-02-07 NOTE — Assessment & Plan Note (Signed)
She never followed up with gynecology for further evaluation. Her menorrhagia improved after stopping Xarelto.  She would like to continue with Coumadin for her recurrent DVT as Coumadin does not cause menorrhagia.

## 2018-02-07 NOTE — Assessment & Plan Note (Signed)
As her venous Doppler was negative for any thrombosis. She was having erythema and tenderness along right anterior shin. As she is having some positive lymph nodes in the right groin area, cellulitis is a possibility.  She was given a prescription of Keflex. She was told to use Tylenol and occasionally ibuprofen to help with the pain. She was also told to elevate her limb and can use compression stockings or warm compresses to help. She will follow-up in clinic if her symptoms fail to resolve or get worse.

## 2018-02-07 NOTE — Progress Notes (Signed)
Preliminary results by tech - Right Lower Ext. Venous Duplex Completed. Negative for deep and superficial vein thrombosis. Results given to Dr. Nelson ChimesAmin. Kara Mcclain, BS, RDMS, RVT

## 2018-02-07 NOTE — Progress Notes (Signed)
   CC: Right leg pain and swelling for 1 day.  HPI:  Kara Mcclain is a 47 y.o. with past medical history significant for recurrent DVT and PE on lifelong anticoagulation came to the clinic with complaint of acute onset right lower leg swelling, redness and pain for 1 day.  Patient was initially on Coumadin, then converted to Xarelto, she used to get her medications through a patient assistant program, that program expired and she was no more getting her Xarelto, she restarted Coumadin on her own approximately 11-5734-month ago as she had some leftover from her previous prescription, she is currently taking 2.5 mg tablet daily, did not follow-up on any Coumadin clinic.  She recently traveled to [place] and came back home on Monday. She denies any palpitations, chest pain or shortness of breath.  Patient has an family history of blood clot in her mother.  Patient wants to continue with Coumadin as she was experiencing heavy  with Xarelto.   Point-of-care ultrasound was performed in the clinic which shows some noncompressibility of distal saphenous vein.  Past Medical History:  Diagnosis Date  . Chest pain    2/2 related to PE with component of anxiety versus pericarditis  Improves with tylenol prn a few times a week EKG with long QTc and nonspecific t wave changes. 2d echo 08/23/2013 - normal  08/26/2013 referred to cards for evaluation Lipid check ordered.   Marland Kitchen. DVT (deep venous thrombosis) (HCC)    "LLE" (07/04/2013)  . Pulmonary embolism (HCC)    Review of Systems: Negative except mentioned in HPI.  Physical Exam:  Vitals:   02/07/18 1033  BP: 126/63  Pulse: 74  Temp: 98.2 F (36.8 C)  TempSrc: Oral  SpO2: 100%  Weight: 243 lb 9.6 oz (110.5 kg)  Height: 5\' 11"  (1.803 m)    General: Vital signs reviewed.  Patient is well-developed and well-nourished, in no acute distress and cooperative with exam.  Head: Normocephalic and atraumatic. Eyes: EOMI, conjunctivae normal, no scleral  icterus.  Cardiovascular: RRR, S1 normal, S2 normal, no murmurs, gallops, or rubs. Pulmonary/Chest: Clear to auscultation bilaterally, no wheezes, rales, or rhonchi. Abdominal: Soft, non-tender, non-distended, BS +, no masses, organomegaly, or guarding present.  Extremities: Edema, erythema and tenderness along medial anterior shin.  Right leg my years 49.5 cm and left leg 48.5 cm.  No calf tenderness. Skin: Warm, dry and intact.  Psychiatric: Normal mood and affect. speech and behavior is normal. Cognition and memory are normal.  Assessment & Plan:   See Encounters Tab for problem based charting.  Patient seen with Dr. Oswaldo DoneVincent.

## 2018-02-07 NOTE — Assessment & Plan Note (Signed)
Patient has an history of iron deficiency anemia because of heavy menstrual cycle secondary to Xarelto.  Patient is taking iron supplement intermittently.  -Recheck CBC with iron studies and ferritin level.

## 2018-02-07 NOTE — Patient Instructions (Addendum)
Thank you for visiting clinic today. As we discussed you do not have any clot in your leg, you are having some enlarged lymph node possibly because of an infection. Most likely you are having some cellulitis.  I am giving you a course of antibiotics course of Keflex for 10 days. You can use Tylenol or occasional ibuprofen for pain. You can try keeping your leg elevated and use some warm compresses for help with pain. I am giving you a new prescription for Coumadin 5 mg daily, please make an appointment with Coumadin clinic to be seen by Monday. If your right leg swelling get worse or you develop a fever please contact the clinic.

## 2018-02-07 NOTE — Assessment & Plan Note (Addendum)
Her history of recurrent DVT, not following at Coumadin clinic, self treating her with a old Coumadin prescription and recent travel can make her high risk for DVT. Point-of-care ultrasound with noncompressible distal saphenous vein. INR at 1.1.  She had a venous Doppler done which shows no  superficial or deep venous thrombosis, she was having lymph node in her right groin area. She might have cellulitis in her right lower leg, she do shave her legs regularly, denies any other trauma. -She was given a prescription of Coumadin 5 mg daily and advised to follow-up at Coumadin clinic on Monday.

## 2018-02-08 LAB — FERRITIN: Ferritin: 23 ng/mL (ref 15–150)

## 2018-02-08 LAB — CBC
Hematocrit: 35.9 % (ref 34.0–46.6)
Hemoglobin: 12 g/dL (ref 11.1–15.9)
MCH: 28.3 pg (ref 26.6–33.0)
MCHC: 33.4 g/dL (ref 31.5–35.7)
MCV: 85 fL (ref 79–97)
Platelets: 378 10*3/uL (ref 150–379)
RBC: 4.24 x10E6/uL (ref 3.77–5.28)
RDW: 15 % (ref 12.3–15.4)
WBC: 5.3 10*3/uL (ref 3.4–10.8)

## 2018-02-08 LAB — IRON: Iron: 51 ug/dL (ref 27–159)

## 2018-02-08 NOTE — Progress Notes (Signed)
Internal Medicine Clinic Attending  I saw and evaluated the patient.  I personally confirmed the key portions of the history and exam documented by Dr. Amin and I reviewed pertinent patient test results.  The assessment, diagnosis, and plan were formulated together and I agree with the documentation in the resident's note. 

## 2018-02-12 ENCOUNTER — Ambulatory Visit: Payer: Self-pay

## 2018-06-11 ENCOUNTER — Ambulatory Visit (INDEPENDENT_AMBULATORY_CARE_PROVIDER_SITE_OTHER): Payer: Self-pay | Admitting: Pharmacist

## 2018-06-11 ENCOUNTER — Other Ambulatory Visit: Payer: Self-pay

## 2018-06-11 ENCOUNTER — Ambulatory Visit: Payer: Self-pay | Admitting: Internal Medicine

## 2018-06-11 ENCOUNTER — Encounter: Payer: Self-pay | Admitting: Internal Medicine

## 2018-06-11 VITALS — BP 123/76 | HR 80 | Temp 98.2°F | Ht 71.0 in | Wt 251.8 lb

## 2018-06-11 DIAGNOSIS — Z0000 Encounter for general adult medical examination without abnormal findings: Secondary | ICD-10-CM

## 2018-06-11 DIAGNOSIS — R609 Edema, unspecified: Secondary | ICD-10-CM

## 2018-06-11 DIAGNOSIS — Z86718 Personal history of other venous thrombosis and embolism: Secondary | ICD-10-CM

## 2018-06-11 DIAGNOSIS — L03115 Cellulitis of right lower limb: Secondary | ICD-10-CM

## 2018-06-11 DIAGNOSIS — I82403 Acute embolism and thrombosis of unspecified deep veins of lower extremity, bilateral: Secondary | ICD-10-CM

## 2018-06-11 DIAGNOSIS — Z7901 Long term (current) use of anticoagulants: Secondary | ICD-10-CM

## 2018-06-11 DIAGNOSIS — I2699 Other pulmonary embolism without acute cor pulmonale: Secondary | ICD-10-CM

## 2018-06-11 LAB — POCT INR: INR: 1.1 — AB (ref 2.0–3.0)

## 2018-06-11 MED ORDER — CEPHALEXIN 500 MG PO CAPS: 500.0000 mg | ORAL_CAPSULE | Freq: Four times a day (QID) | ORAL | 0 refills | Status: DC

## 2018-06-11 MED FILL — WARFARIN SODIUM 5 MG TABLET: 5 | 30 days supply | Qty: 30 | Fill #0

## 2018-06-11 MED FILL — CEPHALEXIN 500 MG CAPSULE: 500 | 10 days supply | Qty: 40 | Fill #0

## 2018-06-11 NOTE — Assessment & Plan Note (Signed)
Patient with possible cellulitis of right lower extremity. Point-of-care ultrasound was performed which shows a small fluid collection at her left lower extremity nodular area. Point-of-care ultrasound on right lower extremity was consistent with generalized inflammation and edema, no localized fluid collection.  She was given a course of Keflex for 10 days-advised her to complete the course even if she feels better after taking few doses.

## 2018-06-11 NOTE — Progress Notes (Signed)
Anticoagulation Management Kara Mcclain is a 47 y.o. female who reports to the clinic for monitoring of warfarin treatment.    Indication: DVT, history of multiple recurrence; long term current use of anticoagulant.   Duration: indefinite Supervising physician: Debe Coder  Anticoagulation Clinic Visit History: Patient does not report signs/symptoms of bleeding or thromboembolism  Other recent changes: No diet, medications, lifestyle changes except as noted in patient findings.  Anticoagulation Episode Summary    Current INR goal:   2.0-3.0  TTR:   25.5 % (4.9 y)  Next INR check:   06/18/2018  INR from last check:     Weekly max warfarin dose:     Target end date:     INR check location:   Anticoagulation Clinic  Preferred lab:     Send INR reminders to:      Indications   DVT lower extremity recurrent (HCC) [I82.409] Recurrent pulmonary embolism (HCC) [I26.99] Long term current use of anticoagulant therapy [Z79.01]       Comments:           No Known Allergies Prior to Admission medications   Medication Sig Start Date End Date Taking? Authorizing Provider  acetaminophen (TYLENOL) 325 MG tablet Take 650 mg by mouth every 6 (six) hours as needed.   Yes [provider]  cephALEXin (KEFLEX) 500 MG capsule Take 1 capsule (500 mg total) by mouth 4 (four) times daily for 10 days. IM Program. 06/11/18 06/21/18 Yes Arnetha Courser, MD  warfarin (COUMADIN) 5 MG tablet Take 1 tablet (5 mg total) by mouth daily. IM Program 02/07/18 02/07/19 Yes Arnetha Courser, MD  ferrous sulfate 325 (65 FE) MG EC tablet Take 1 tablet (325 mg total) by mouth daily with breakfast. 04/18/17 04/18/18  Hyacinth Meeker, MD   Past Medical History:  Diagnosis Date  . Chest pain    2/2 related to PE with component of anxiety versus pericarditis  Improves with tylenol prn a few times a week EKG with long QTc and nonspecific t wave changes. 2d echo 08/23/2013 - normal  08/26/2013 referred to cards for  evaluation Lipid check ordered.   Marland Kitchen DVT (deep venous thrombosis) (HCC)    "LLE" (07/04/2013)  . Pulmonary embolism [facility])    Social History   Socioeconomic History  . Marital status: Married    Spouse name: Not on file  . Number of children: Not on file  . Years of education: Not on file  . Highest education level: Not on file  Occupational History  . Not on file  Social Needs  . Financial resource strain: Not on file  . Food insecurity:    Worry: Not on file    Inability: Not on file  . Transportation needs:    Medical: Not on file    Non-medical: Not on file  Tobacco Use  . Smoking status: Never Smoker  . Smokeless tobacco: Never Used  Substance and Sexual Activity  . Alcohol use: Yes    Alcohol/week: 1.8 oz    Types: 3 Shots of liquor per week    Comment: sometimes  . Drug use: No    Types: Marijuana    Comment: 07/04/2013 "last marijuana ~ 3 wks ago"  . Sexual activity: Yes    Birth control/protection: Condom  Lifestyle  . Physical activity:    Days per week: Not on file    Minutes per session: Not on file  . Stress: Not on file  Relationships  . Social connections:  Talks on phone: Not on file    Gets together: Not on file    Attends religious service: Not on file    Active member of club or organization: Not on file    Attends meetings of clubs or organizations: Not on file    Relationship status: Not on file  Other Topics Concern  . Not on file  Social History Narrative  . Not on file   Family History  Problem Relation Age of Onset  . Pulmonary embolism Mother   . Deep vein thrombosis Mother   . Cancer Mother   . Hypertension Mother   . Stroke Father   . Gout Sister   . Diabetes Sister   . Hypertension Sister     ASSESSMENT Recent Results: The most recent result is correlated with 35 mg per week: Lab Results  Component Value Date   INR 1.1 (A) 06/11/2018   INR 1.1 02/07/2018   INR 1.5 06/28/2016    Anticoagulation Dosing: Description    Take 2 of your 5mg -peach colored warfarin tablets on Monday and Tuesday of this week. Wednesday and Friday--take one and one-half tablets; all other days, take only one (1) tablet. Return to clinic at [facility]3PM Monday, July 29th 2019 for repeat INR.      INR today: Subtherapeutic  PLAN Weekly dose was increased to 10mg  x 2 days; 7.5mg  x 2 days; 5mg  x 3 days, then return to clinic.   Patient Instructions  Patient instructed to take medications as defined in the Anti-coagulation Track section of this encounter.  Patient instructed to take today's dose.  Patient instructed to take 2 of your 5mg -peach colored warfarin tablets on Monday and Tuesday of this week. Wednesday and Friday--take one and one-half tablets; all other days, take only one (1) tablet. Return to clinic at [facility] Monday, July 29th 2019 for repeat INR.  Patient verbalized understanding of these instructions.     Patient advised to contact clinic or seek medical attention if signs/symptoms of bleeding or thromboembolism occur.  Patient verbalized understanding by repeating back information and was advised to contact me if further medication-related questions arise. Patient was also provided an information handout.  Follow-up Return in 1 week (on 06/18/2018) for Follow up INR at 3PM.  Elicia LampJames B Solveig Fangman, PharmD, CACP, CPP  15 minutes spent face-to-face with the patient during the encounter. 50% of time spent on education. 50% of time was spent on fingerstick point of care INR sample collection, processing, results determination, dose adjustment and documentation and discussion with Resident Physician seeing the patient.

## 2018-06-11 NOTE — Patient Instructions (Signed)
Patient instructed to take medications as defined in the Anti-coagulation Track section of this encounter.  Patient instructed to take today's dose.  Patient instructed to take 2 of your 5mg -peach colored warfarin tablets on Monday and Tuesday of this week. Wednesday and Friday--take one and one-half tablets; all other days, take only one (1) tablet. Return to clinic at [facility]3PM Monday, July 29th 2019 for repeat INR.  Patient verbalized understanding of these instructions.

## 2018-06-11 NOTE — Progress Notes (Signed)
   CC: Bilateral lower extremity edema, worse on right since Thursday.  HPI:  [name] is a 47 y.o. with past medical history as listed below came to the clinic with complaint of worsening of his lower extremity edema, more on right, it is associated with pain and redness along her calf area.  Patient was seen in the clinic last time with similar symptoms on the left lower leg, venous Doppler was negative for any recurrent DVT, she was given Keflex for possible cellulitis, she just took medic son for 2 days as that did  cause improvement in her symptoms so she stopped taking it. She denies any fever or chills.  No history of recent travel.  No shortness of breath.  Patient continued to take Coumadin 5 mg daily, never followed up in Coumadin clinic.  Please see assessment and plan for her chronic conditions.  Past Medical History:  Diagnosis Date  . Chest pain    2/2 related to PE with component of anxiety versus pericarditis  Improves with tylenol prn a few times a week EKG with long QTc and nonspecific t wave changes. 2d echo 08/23/2013 - normal  08/26/2013 referred to cards for evaluation Lipid check ordered.   Marland Kitchen. DVT (deep venous thrombosis) (HCC)    "LLE" (07/04/2013)  . Pulmonary embolism (HCC)    Review of Systems: Negative except mentioned in HPI.  Physical Exam:  Vitals:   06/11/18 1402  BP: 123/76  Pulse: 80  Temp: 98.2 F (36.8 C)  TempSrc: Oral  SpO2: 100%  Weight: 251 lb 12.8 oz (114.2 kg)  Height: 5\' 11"  (1.803 m)    General: Vital signs reviewed.  Patient is well-developed and well-nourished, in no acute distress and cooperative with exam.  Head: Normocephalic and atraumatic. Eyes: EOMI, conjunctivae normal, no scleral icterus.  Cardiovascular: RRR, S1 normal, S2 normal, no murmurs, gallops, or rubs. Pulmonary/Chest: Clear to auscultation bilaterally, no wheezes, rales, or rhonchi. Abdominal: Soft, non-tender, non-distended, BS +, no masses,  organomegaly, or guarding present.  Extremities: Bilateral lower extremity edema worse around her ankles, more on right lower extremity with induration and erythema around her medio posterior calf.  There was some hyperthermia and tenderness along that area.  No calf tenderness.  There was an nodular induration on medial side of lower left leg with no erythema or tenderness. Skin: Warm, dry and intact.  Psychiatric: Normal mood and affect. speech and behavior is normal. Cognition and memory are normal.  Assessment & Plan:   See Encounters Tab for problem based charting.  Patient seen with Dr. Criselda PeachesMullen.

## 2018-06-11 NOTE — Patient Instructions (Addendum)
Thank you for visiting clinic today. It looks like you are having another episode of cellulitis. I am giving you a course of antibiotics called Keflex you will take it 500mg  tablet every 6 hourly for 10 days-please complete the whole course. For your ankle swelling keep your legs elevated when you are sitting, you can also use compression stockings while on your feet. Please follow-up with Coumadin clinic regularly as we need to monitor your INR.

## 2018-06-11 NOTE — Assessment & Plan Note (Signed)
According to patient she is taking Coumadin 5 mg daily.  INR checked today was 1.1  Seen by Dr. Alexandria LodgeGroce and advised to make some changes to her dosage, needs to follow-up at Coumadin clinic within 1 week. Patient verbally agreed with that plan.

## 2018-06-11 NOTE — Assessment & Plan Note (Signed)
Mammogram was ordered. She is due for Pap smear-she wants to make another appointment after resolution of her  Cellulitis.

## 2018-06-13 NOTE — Progress Notes (Signed)
I have reviewed Dr. Saralyn PilarGroce's note.  Patient with history of recurrent DVT on coumadin, INR 1.1 and coumadin was increased.

## 2018-06-13 NOTE — Progress Notes (Signed)
Internal Medicine Clinic Attending  I saw and evaluated the patient.  I personally confirmed the key portions of the history and exam documented by Dr. Amin and I reviewed pertinent patient test results.  The assessment, diagnosis, and plan were formulated together and I agree with the documentation in the resident's note. 

## 2018-06-15 ENCOUNTER — Other Ambulatory Visit: Payer: Self-pay

## 2018-06-15 ENCOUNTER — Telehealth: Payer: Self-pay | Admitting: *Deleted

## 2018-06-15 ENCOUNTER — Emergency Department (HOSPITAL_COMMUNITY): Payer: Self-pay

## 2018-06-15 ENCOUNTER — Encounter (HOSPITAL_COMMUNITY): Payer: Self-pay | Admitting: Emergency Medicine

## 2018-06-15 ENCOUNTER — Observation Stay (HOSPITAL_COMMUNITY)
Admission: EM | Admit: 2018-06-15 | Discharge: 2018-06-16 | DRG: 176 | Disposition: A | Discharge status: Home / Self Care | Payer: Self-pay | Attending: Internal Medicine | Admitting: Internal Medicine

## 2018-06-15 DIAGNOSIS — R791 Abnormal coagulation profile: Secondary | ICD-10-CM | POA: Diagnosis present

## 2018-06-15 DIAGNOSIS — I2609 Other pulmonary embolism with acute cor pulmonale: Secondary | ICD-10-CM

## 2018-06-15 DIAGNOSIS — Z79899 Other long term (current) drug therapy: Secondary | ICD-10-CM

## 2018-06-15 DIAGNOSIS — Z9114 Patient's other noncompliance with medication regimen: Secondary | ICD-10-CM

## 2018-06-15 DIAGNOSIS — Z7901 Long term (current) use of anticoagulants: Secondary | ICD-10-CM

## 2018-06-15 DIAGNOSIS — Z86718 Personal history of other venous thrombosis and embolism: Secondary | ICD-10-CM

## 2018-06-15 DIAGNOSIS — D649 Anemia, unspecified: Secondary | ICD-10-CM | POA: Diagnosis present

## 2018-06-15 DIAGNOSIS — I2699 Other pulmonary embolism without acute cor pulmonale: Principal | ICD-10-CM | POA: Diagnosis present

## 2018-06-15 DIAGNOSIS — J90 Pleural effusion, not elsewhere classified: Secondary | ICD-10-CM | POA: Diagnosis present

## 2018-06-15 LAB — BASIC METABOLIC PANEL
ANION GAP: 9 (ref 5–15)
BUN: 8 mg/dL (ref 6–20)
CALCIUM: 9.3 mg/dL (ref 8.9–10.3)
CO2: 27 mmol/L (ref 22–32)
CREATININE: 0.78 mg/dL (ref 0.44–1.00)
Chloride: 105 mmol/L (ref 98–111)
GFR calc Af Amer: >60 mL/min (ref >60)
GFR calc non Af Amer: >60 mL/min (ref >60)
GLUCOSE: 108 mg/dL — AB (ref 70–99)
Potassium: 4 mmol/L (ref 3.5–5.1)
Sodium: 141 mmol/L (ref 135–145)

## 2018-06-15 LAB — CBC
HCT: 36.7 % (ref 36.0–46.0)
HEMOGLOBIN: 11.4 g/dL — AB (ref 12.0–15.0)
MCH: 27.7 pg (ref 26.0–34.0)
MCHC: 31.1 g/dL (ref 30.0–36.0)
MCV: 89.1 fL (ref 78.0–100.0)
Platelets: 296 10*3/uL (ref 150–400)
RBC: 4.12 MIL/uL (ref 3.87–5.11)
RDW: 13.3 % (ref 11.5–15.5)
WBC: 8.1 10*3/uL (ref 4.0–10.5)

## 2018-06-15 LAB — I-STAT TROPONIN, ED: TROPONIN I, POC: 0.06 ng/mL (ref 0.00–0.08)

## 2018-06-15 LAB — PROTIME-INR
INR: 1.45
Prothrombin Time: 17.5 seconds — ABNORMAL HIGH (ref 11.4–15.2)

## 2018-06-15 LAB — I-STAT BETA HCG BLOOD, ED (MC, WL, AP ONLY): I-stat hCG, quantitative: <5 m[IU]/mL (ref <5)

## 2018-06-15 LAB — MRSA PCR SCREENING: MRSA BY PCR: NEGATIVE

## 2018-06-15 MED ORDER — HEPARIN (PORCINE) IN NACL 100-0.45 UNIT/ML-% IJ SOLN
1600.0000 [IU]/h | INTRAMUSCULAR | Status: AC
  Administered 2018-06-15 – 2018-06-16 (×2): 1600 [IU]/h via INTRAVENOUS
  Filled 2018-06-15 (×2): qty 250

## 2018-06-15 MED ORDER — ACETAMINOPHEN 500 MG PO TABS
1000.0000 mg | ORAL_TABLET | Freq: Four times a day (QID) | ORAL | Status: DC | PRN
  Administered 2018-06-15 – 2018-06-16 (×3): 1000 mg via ORAL
  Filled 2018-06-15 (×3): qty 2

## 2018-06-15 MED ORDER — HEPARIN BOLUS VIA INFUSION
4000.0000 [IU] | Freq: Once | INTRAVENOUS | Status: AC
  Administered 2018-06-15: 4000 [IU] via INTRAVENOUS
  Filled 2018-06-15: qty 4000

## 2018-06-15 MED ORDER — OXYCODONE HCL 5 MG PO TABS
5.0000 mg | ORAL_TABLET | ORAL | Status: DC | PRN
  Administered 2018-06-16 (×2): 5 mg via ORAL
  Filled 2018-06-15 (×2): qty 1

## 2018-06-15 MED ORDER — MORPHINE SULFATE (PF) 4 MG/ML IV SOLN
2.0000 mg | Freq: Once | INTRAVENOUS | Status: AC
Start: 2018-06-15 — End: 2018-06-15
  Administered 2018-06-15: 2 mg via INTRAVENOUS
  Filled 2018-06-15: qty 1

## 2018-06-15 MED ORDER — IOPAMIDOL (ISOVUE-370) INJECTION 76%
INTRAVENOUS | Status: AC
  Filled 2018-06-15: qty 100

## 2018-06-15 MED ORDER — SODIUM CHLORIDE 0.9 % IV SOLN: INTRAVENOUS | Status: DC

## 2018-06-15 MED ORDER — HEPARIN BOLUS VIA INFUSION
3000.0000 [IU] | Freq: Once | INTRAVENOUS | Status: DC
  Filled 2018-06-15: qty 3000

## 2018-06-15 MED ORDER — IOPAMIDOL (ISOVUE-370) INJECTION 76%
100.0000 mL | Freq: Once | INTRAVENOUS | Status: AC | PRN
  Administered 2018-06-15: 100 mL via INTRAVENOUS

## 2018-06-15 NOTE — ED Provider Notes (Signed)
[facility] EMERGENCY DEPARTMENT Provider Note   CSN: [id number] Arrival date & time: 06/15/18  1431     History   Chief Complaint Chief Complaint  Patient presents with  . Chest Pain    HPI Kara Mcclain is a 47 y.o. female.  HPI  47 year old female with history of DVT and pulmonary embolism, noncompliant with Coumadin, who presents today complaining of left-sided chest pain that began on Monday.  It is sharp in nature radiates from the left breast around to the side.  Is worse with movement, bending over, and deep inspiration.  Notes dyspnea with exertion.  She has had pain and swelling of the right lower leg.  She was seen 4 days ago in clinic and started on Keflex for possible cellulitis of her right lower extremity.  She describes some redness that began in the medial aspect of the right calf.  Since that time she has had increased swelling and increased redness spreading over the lower calf.  She denies fever, chills, nausea, vomiting, abdominal pain.  She states that since Monday she has been taking her Coumadin as prescribed at 5 mg at 6 PM. Last menstrual period began on Monday and is normal Past Medical History:  Diagnosis Date  . Chest pain    2/2 related to PE with component of anxiety versus pericarditis  Improves with tylenol prn a few times a week EKG with long QTc and nonspecific t wave changes. 2d echo 08/23/2013 - normal  08/26/2013 referred to cards for evaluation Lipid check ordered.   Marland Kitchen DVT (deep venous thrombosis) (HCC)    "LLE" (07/04/2013)  . Pulmonary embolism [facility])     Patient Active Problem List   Diagnosis Date Noted  . Cellulitis of right lower extremity 02/07/2018  . Chronic anemia 04/14/2017  . Menorrhagia 04/14/2017  . Centrilobular emphysema CT dx/ pfts nl 11/06/13  09/19/2013  . Preventative health care 08/14/2013  . Long term current use of anticoagulant therapy 07/08/2013  . Recurrent pulmonary embolism (HCC) 07/04/2013     Past Surgical History:  Procedure Laterality Date  . DIAGNOSTIC LAPAROSCOPY WITH REMOVAL OF ECTOPIC PREGNANCY N/A 11/16/2014   Procedure: DIAGNOSTIC LAPAROSCOPY WITH REMOVAL OF ECTOPIC PREGNANCY;  Surgeon: Tilda Burrow, MD;  Location: [facility];  Service: Gynecology;  Laterality: N/A;  . NO PAST SURGERIES       OB History    Gravida  1   Para      Term      Preterm      AB  1   Living        SAB      TAB      Ectopic  1   Multiple      Live Births               Home Medications    Prior to Admission medications   Medication Sig Start Date End Date Taking? Authorizing Provider  acetaminophen (TYLENOL) 325 MG tablet Take 650 mg by mouth every 6 (six) hours as needed.    [provider]  cephALEXin (KEFLEX) 500 MG capsule Take 1 capsule (500 mg total) by mouth 4 (four) times daily for 10 days. IM Program. 06/11/18 06/21/18  Arnetha Courser, MD  ferrous sulfate 325 (65 FE) MG EC tablet Take 1 tablet (325 mg total) by mouth daily with breakfast. 04/18/17 04/18/18  Hyacinth Meeker, MD  warfarin (COUMADIN) 5 MG tablet Take 1 tablet (5 mg total) by mouth  daily. IM Program 02/07/18 02/07/19  Arnetha CourserAmin, Sumayya, MD    Family History Family History  Problem Relation Age of Onset  . Pulmonary embolism Mother   . Deep vein thrombosis Mother   . Cancer Mother   . Hypertension Mother   . Stroke Father   . Gout Sister   . Diabetes Sister   . Hypertension Sister     Social History Social History   Tobacco Use  . Smoking status: Never Smoker  . Smokeless tobacco: Never Used  Substance Use Topics  . Alcohol use: Yes    Alcohol/week: 1.8 oz    Types: 3 Shots of liquor per week    Comment: sometimes  . Drug use: No    Types: Marijuana    Comment: 07/04/2013 "last marijuana ~ 3 wks ago"     Allergies   Patient has no known allergies.   Review of Systems Review of Systems  All other systems reviewed and are negative.    Physical Exam Updated Vital  Signs BP 131/74 (BP Location: Right Arm)   Pulse 79   Temp 98.4 F (36.9 C) (Oral)   Resp 16   LMP 06/11/2018   SpO2 98%   Physical Exam  Constitutional: She appears well-developed and well-nourished.  HENT:  Head: Normocephalic and atraumatic.  Eyes: Pupils are equal, round, and reactive to light. EOM are normal.  Neck: Normal range of motion. Neck supple.  Cardiovascular: Normal rate, regular rhythm, intact distal pulses and normal pulses.  Pulmonary/Chest: Effort normal and breath sounds normal.  Abdominal: Soft. Bowel sounds are normal.  Musculoskeletal: Normal range of motion.       Right lower leg: She exhibits tenderness and edema.       Left lower leg: She exhibits tenderness and edema.  Patient with swelling right lower extremity > lle, erythema rle medial aspect with progression proximally toward knee and erythema foot and lower leg  Neurological: She is alert.  Skin: Skin is warm and dry.  Psychiatric: She has a normal mood and affect.  Nursing note and vitals reviewed.    ED Treatments / Results  Labs (all labs ordered are listed, but only abnormal results are displayed) Labs Reviewed  BASIC METABOLIC PANEL - Abnormal; Notable for the following components:      Result Value   Glucose, Bld 108 (*)    All other components within normal limits  CBC - Abnormal; Notable for the following components:   Hemoglobin 11.4 (*)    All other components within normal limits  PROTIME-INR  I-STAT TROPONIN, ED  I-STAT BETA HCG BLOOD, ED ([facility], [facility], [facility] ONLY)    EKG EKG Interpretation  Date/Time:  Friday June 15 2018 14:37:51 EDT Ventricular Rate:  90 PR Interval:  152 QRS Duration: 78 QT Interval:  396 QTC Calculation: 484 R Axis:   45 Text Interpretation:  Normal sinus rhythm Prolonged QT Abnormal ECG Confirmed by Margarita Grizzleay, Arnika Larzelere [phone]([id number]) on 06/15/2018 4:10:54 PM   Radiology Dg Chest 2 View  Result Date: 06/15/2018 CLINICAL DATA:  Chest pain EXAM: CHEST - 2 VIEW  COMPARISON:  Chest radiograph November 10, 2014 and chest CT November 10, 2014. FINDINGS: There is bibasilar atelectatic change with a minimal left pleural effusion. Lungs elsewhere are clear. Heart size and pulmonary vascularity are normal. No adenopathy. There is midthoracic dextroscoliosis. IMPRESSION: Bibasilar atelectasis with minimal left pleural effusion. Lungs elsewhere clear. Cardiac silhouette within normal limits. Electronically Signed   By: Bretta BangWilliam  Woodruff III M.D.  On: 06/15/2018 15:47   Ct Angio Chest Pe W And/or Wo Contrast  Result Date: 06/15/2018 CLINICAL DATA:  Chest pain. EXAM: CT ANGIOGRAPHY CHEST WITH CONTRAST TECHNIQUE: Multidetector CT imaging of the chest was performed using the standard protocol during bolus administration of intravenous contrast. Multiplanar CT image reconstructions and MIPs were obtained to evaluate the vascular anatomy. CONTRAST:  65 mL ISOVUE-370 IOPAMIDOL (ISOVUE-370) INJECTION 76% COMPARISON:  Radiographs of same day.  CT scan of November 10, 2014. FINDINGS: Cardiovascular: There is no evidence of thoracic aortic dissection or aneurysm. Large filling defect is seen in lower lobe branches of left pulmonary artery. Smaller filling defect is seen in right lower lobe branch. RV/LV ratio of 1.05 is noted suggesting right heart strain. Mediastinum/Nodes: No enlarged mediastinal, hilar, or axillary lymph nodes. Thyroid gland, trachea, and esophagus demonstrate no significant findings. Lungs/Pleura: No pneumothorax or pleural effusion is noted. Left basilar subsegmental atelectasis is noted. Upper Abdomen: No acute abnormality. Musculoskeletal: No chest wall abnormality. No acute or significant osseous findings. Review of the MIP images confirms the above findings. IMPRESSION: Large filling defect seen in lower lobe branches of left pulmonary artery with smaller defect seen in lower lobe branches of right pulmonary artery. Positive for acute PE with CT evidence of right  heart strain (RV/LV Ratio = 1.05) consistent with at least submassive (intermediate risk) PE. The presence of right heart strain has been associated with an increased risk of morbidity and mortality. Please activate Code PE by paging [phone]. Critical Value/emergent results were called by telephone at the time of interpretation on 06/15/2018 at 7:32 pm to Dr. Margarita Grizzle , who verbally acknowledged these results. Electronically Signed   By: Lupita Raider, M.D.   On: 06/15/2018 19:32   Discussed results of CT angiogram with Dr. Chilton Si.  Acid for acute PE with evidence of right heart strain.  Patient appears hemodynamically stable here in ED.  Patient has been noncompliant with Coumadin and is sub-therapeutic.  Heparin is ordered. Procedures .Critical Care Performed by: Margarita Grizzle, MD Authorized by: Margarita Grizzle, MD   Critical care provider statement:    Critical care time (minutes):  30   Critical care end time:  06/15/2018 7:48 PM   Critical care was necessary to treat or prevent imminent or life-threatening deterioration of the following conditions:  Cardiac failure   Critical care was time spent personally by me on the following activities:  Development of treatment plan with patient or surrogate, discussions with consultants, discussions with primary provider, examination of patient, ordering and performing treatments and interventions, ordering and review of laboratory studies, ordering and review of radiographic studies, pulse oximetry and re-evaluation of patient's condition   (including critical care time)  Medications Ordered in ED Medications - No data to display   Initial Impression / Assessment and Plan / ED Course  I have reviewed the triage vital signs and the nursing notes.  Pertinent labs & imaging results that were available during my care of the patient were reviewed by me and considered in my medical decision making (see chart for details).    47 year old female  history of DVT and PE who has been noncompliant with her Coumadin.  Presents today with right leg swelling and left-sided chest pain.  Here work-up was significant for left-sided pulmonary embolism.  She is subtherapeutic on Coumadin and is heparinized here. Patient discussed with Dr. Samuella Cota for internal medicine teaching service and she will see for admission  Final Clinical Impressions(s) / ED Diagnoses  Final diagnoses:  None    ED Discharge Orders    None       Margarita Grizzle, MD 06/15/18 1949

## 2018-06-15 NOTE — Telephone Encounter (Signed)
Call from pt - stated she was seen on Monday for cellulitis of her leg but now the swelling is in her foot. Then she also c/o chest pain which she said started yesterday and continuous to have today intermittently. Not radiating down her arm but is in her back. Hx PE No c/o SOB. Informed to go to ED to be eval for active CP; she's concern about the long wait. Wanted to know if we could do an EKG here  Told her I will talk to the Attending.   Talked to Dr Sandre Kittyaines - stated we could do EKG but best if she goes to the ED if CT is with her given symptoms/hx.  After talking to Dr Sandre Kittyaines - advised pt to go to ED esp since she's c/o CP - stated she will

## 2018-06-15 NOTE — ED Notes (Signed)
RN attempted x 2 for IV with no success 

## 2018-06-15 NOTE — H&P (Signed)
Date: 06/15/2018               Patient Name:  JETTE LEWAN MRN: [id number]  DOB: 06-28-1971 Age / Sex: 47 y.o., female   PCP: Arnetha Courser, MD         Medical Service: Internal Medicine Teaching Service         Attending Physician: Dr. Earl Lagos, MD    First Contact: Dr. Dortha Schwalbe Pager: [phone]  Second Contact: Dr. Samuella Cota Pager: [phone]       After Hours (After 5p/  First Contact Pager: [phone]  weekends / holidays): Second Contact Pager: [phone]   Chief Complaint:  Chest pain with LE swelling  History of Present Illness:  This is a relatively healthy 47 year old female with a PMH of DVT (2014 and 2015, on warfarin) presenting with left sided chest pain and right lower extremity edema. She is accompanied by her mother and boyfriend. She reports that her right leg was getting sore and red so she went to the clinic on Monday to see Dr. Nelson Chimes. She had an ultrasound done which showed no evidence of a DVT at that time. She was prescribed Keflex for a possible cellulitis. The symptoms continued to worsen and she developed more RLE swelling and chest pain on Wednesday. The chest pain is located on the left side, is a sharp/pulling sensation, worse with deep breaths and coughs. She denied any recent travel, last travel was in march. She is on warfarin daily but has not been to the INR clinic since March. She did get her levels drawn on Monday while at the clinic whicch showed a subtherapeutic INR of 1.1. When she had the DVT in 2014 and 2015 the pain was located in the back of her knees.   In the ED she was hemodynamically stable. BMP and CBC were normal except for a mild anemia of 11.4. PT was 17.5, INR was 1.45. CXR showed bibasilar atelectasis with left pleural effussion. CTA showed a large filling defect in the lower lobe branches of the left pulmonary artery, consistent with a submassive PE. She was given morphine for the pain and started on a heparin drip. Patient was admitted to  internal medicine.    Meds:  No current facility-administered medications on file prior to encounter.    Current Outpatient Medications on File Prior to Encounter  Medication Sig Dispense Refill  . acetaminophen (TYLENOL) 325 MG tablet Take 650 mg by mouth every 6 (six) hours as needed for mild pain or headache.     . cephALEXin (KEFLEX) 500 MG capsule Take 1 capsule (500 mg total) by mouth 4 (four) times daily for 10 days. IM Program. 40 capsule 0  . ferrous sulfate 325 (65 FE) MG EC tablet Take 1 tablet (325 mg total) by mouth daily with breakfast. 30 tablet 0  . warfarin (COUMADIN) 5 MG tablet Take 1 tablet (5 mg total) by mouth daily. IM Program (Patient taking differently: Take 7.5-10 mg by mouth See admin instructions. Take 10 mg by mouth at 6 PM daily on Sun/Tues/Thurs/Sat and 7.5 mg on Mon/Wed/Fri) 30 tablet 11    Current Meds  Medication Sig  . acetaminophen (TYLENOL) 325 MG tablet Take 650 mg by mouth every 6 (six) hours as needed for mild pain or headache.   . cephALEXin (KEFLEX) 500 MG capsule Take 1 capsule (500 mg total) by mouth 4 (four) times daily for 10 days. IM Program.  . ferrous sulfate 325 (65 FE) MG  EC tablet Take 1 tablet (325 mg total) by mouth daily with breakfast.  . warfarin (COUMADIN) 5 MG tablet Take 1 tablet (5 mg total) by mouth daily. IM Program (Patient taking differently: Take 7.5-10 mg by mouth See admin instructions. Take 10 mg by mouth at 6 PM daily on Sun/Tues/Thurs/Sat and 7.5 mg on Mon/Wed/Fri)     Allergies: Allergies as of 06/15/2018  . (No Known Allergies)   Past Medical History:  Diagnosis Date  . Chest pain    2/2 related to PE with component of anxiety versus pericarditis  Improves with tylenol prn a few times a week EKG with long QTc and nonspecific t wave changes. 2d echo 08/23/2013 - normal  08/26/2013 referred to cards for evaluation Lipid check ordered.   Marland Kitchen. DVT (deep venous thrombosis) (HCC)    "LLE" (07/04/2013)  . Pulmonary embolism  (HCC)     Family History: Significant history of clots in family, in mother, sister, and uncle.   Social History: Denies smoking, or drug use. Drinks about 3 mixed drinks a month, no history of withdrawal, last drink last night. Lives with mom and boyfriend.   Review of Systems: A complete ROS was negative except as per HPI.   Physical Exam: Blood pressure (!) 143/93, pulse 91, temperature 98.4 F (36.9 C), temperature source Oral, resp. rate 20, height 5\' 11"  (1.803 m), weight 244 lb 11.2 oz (111 kg), last menstrual period 06/11/2018, SpO2 100 %, unknown if currently breastfeeding. General: alert, cooperative, appears stated age and no distress HEENT: PERRLA and neck supple with midline trachea Heart: S1, S2 normal, no murmur, rub or gallop, regular rate and rhythm Lungs: clear to auscultation, no wheezes or rales and unlabored breathing Abdomen: abdomen is soft without significant tenderness, masses, organomegaly or guarding Musculoskeletal: Right lower extremity showed 2+ edema up to mid shin, warm, erythematous, tender to palpation, and indurated on medial aspect. Left lower extremity showed 1+ edema to mid shin, induration and hyperpigmented on medial aspect. Skin:hyperpigmentation -  lower leg(s) left Neurology: normal without focal findings, mental status, speech normal, alert and oriented x3 and PERLA, moves all extremities Psychiatric: normal mood and affect   EKG: personally reviewed my interpretation is rate of 90 bmp, normal sinus rhythm, normal axis, no ST changes  CXR: personally reviewed my interpretation is bibasilar atelectasis  Assessment & Plan by Problem: This is a relatively healthy 47 year old female with a PMH of DVT (2014 and 2015, on warfarin) presenting with left sided chest pain and right lower extremity edema. Admitted for a submassive PE.   Sub-massive pulmonary embolism, likely 2/2 sub-therauputic INR on warfarin: Patient presented with a 4 day history of  a right lower extremity pain, redness and swelling that did not improve with antibiotic therapy. She developed sudden onset SOB and chest pain. She was found to be hemodynamically stable. Her lungs were clear, and her right leg was red, edematous, and tender to palpation. CXR showed bibasliar atelectasis and left  pleural effusion. CTA showed a submassive PE in the left pulmonary vein. She does not get her INR checked regularly and her INR from Monday was subtherapeutic. She has a history of DVTs and a significant family history of DVTs. There is a possibiltiy of  -Heparin drip -Daily heparin levels -Oxygen -Tylenol for pain - Oxy IR PRN for pain  FEN: 100cc/hr, replete lytes prn, heart healthy diet  VTE ppx: Heparin Code Status: FULL    Dispo: Admit patient to Inpatient with expected  length of stay greater than 2 midnights.  Signed: Claudean Severance, MD 06/15/2018, 9:24 PM  Pager: [phone]

## 2018-06-15 NOTE — ED Triage Notes (Signed)
Patient complains of "pulling" chest pain under her left breast that started yesterday.  Reports history of DVT, denies other symptoms at this time. PAtient alert, oriented, and in no apparent distress at this time.

## 2018-06-15 NOTE — Progress Notes (Signed)
Pt arrived to unit accompanied by hospital staff and grandmother. Pt has no complaints.Pt has heparin infusing at 7416ml/hr. Will continue to monitor the pt.

## 2018-06-15 NOTE — Progress Notes (Signed)
ANTICOAGULATION CONSULT NOTE - Initial Consult  Pharmacy Consult for heparin Indication: chest pain/ACS  No Known Allergies  Patient Measurements: TBW 114 kg Heparin Dosing Weight: 96.2 kg  Vital Signs: Temp: 98.4 F (36.9 C) (07/26 1445) Temp Source: Oral (07/26 1445) BP: 136/84 (07/26 1830) Pulse Rate: 83 (07/26 1830)  Labs: Recent Labs    06/15/18 1506  HGB 11.4*  HCT 36.7  PLT 296  LABPROT 17.5*  INR 1.45  CREATININE 0.78    Estimated Creatinine Clearance: 123.6 mL/min (by C-G formula based on SCr of 0.78 mg/dL).   Medical History: Past Medical History:  Diagnosis Date  . Chest pain    2/2 related to PE with component of anxiety versus pericarditis  Improves with tylenol prn a few times a week EKG with long QTc and nonspecific t wave changes. 2d echo 08/23/2013 - normal  08/26/2013 referred to cards for evaluation Lipid check ordered.   Marland Kitchen. DVT (deep venous thrombosis) (HCC)    "LLE" (07/04/2013)  . Pulmonary embolism [facility](HCC)    Assessment: 47 yo F presents with CP. On Coumadin 5mg  PO daily PTA for hx of DVT. INR on admit low at 1.45. Hgb 11.4, plts wnl.  Goal of Therapy:  Heparin level 0.3-0.7 units/ml Monitor platelets by anticoagulation protocol: Yes   Plan:  Give small heparin 4,000 unit bolus since on Coumadin Start heparin gtt at 1,600 units/hr Monitor daily heparin level, CBC, s/s of bleed  Kohle Winner J 06/15/2018,7:37 PM

## 2018-06-16 ENCOUNTER — Encounter (HOSPITAL_COMMUNITY): Payer: Self-pay

## 2018-06-16 DIAGNOSIS — Z7901 Long term (current) use of anticoagulants: Secondary | ICD-10-CM

## 2018-06-16 DIAGNOSIS — R791 Abnormal coagulation profile: Secondary | ICD-10-CM

## 2018-06-16 DIAGNOSIS — Z832 Family history of diseases of the blood and blood-forming organs and certain disorders involving the immune mechanism: Secondary | ICD-10-CM

## 2018-06-16 DIAGNOSIS — I2699 Other pulmonary embolism without acute cor pulmonale: Principal | ICD-10-CM

## 2018-06-16 DIAGNOSIS — Z86718 Personal history of other venous thrombosis and embolism: Secondary | ICD-10-CM

## 2018-06-16 LAB — HEPARIN LEVEL (UNFRACTIONATED)
HEPARIN UNFRACTIONATED: 0.45 [IU]/mL (ref 0.30–0.70)
HEPARIN UNFRACTIONATED: 0.46 [IU]/mL (ref 0.30–0.70)

## 2018-06-16 LAB — CBC
HEMATOCRIT: 34.9 % — AB (ref 36.0–46.0)
HEMOGLOBIN: 11.3 g/dL — AB (ref 12.0–15.0)
MCH: 28.3 pg (ref 26.0–34.0)
MCHC: 32.4 g/dL (ref 30.0–36.0)
MCV: 87.5 fL (ref 78.0–100.0)
Platelets: 281 10*3/uL (ref 150–400)
RBC: 3.99 MIL/uL (ref 3.87–5.11)
RDW: 13.1 % (ref 11.5–15.5)
WBC: 9.8 10*3/uL (ref 4.0–10.5)

## 2018-06-16 LAB — HIV ANTIBODY (ROUTINE TESTING W REFLEX): HIV Screen 4th Generation wRfx: NONREACTIVE

## 2018-06-16 MED ORDER — APIXABAN 5 MG PO TABS
10.0000 mg | ORAL_TABLET | Freq: Two times a day (BID) | ORAL | Status: DC
  Administered 2018-06-16: 10 mg via ORAL
  Filled 2018-06-16: qty 2

## 2018-06-16 MED ORDER — OXYCODONE HCL 5 MG PO TABS: 5.0000 mg | ORAL_TABLET | Freq: Four times a day (QID) | ORAL | 0 refills | Status: AC | PRN

## 2018-06-16 MED ORDER — ELIQUIS 5 MG VTE STARTER PACK: ORAL_TABLET | ORAL | 0 refills | Status: DC

## 2018-06-16 NOTE — Discharge Instructions (Addendum)
Information on my medicine - ELIQUIS (apixaban)  This medication education was reviewed with me or my healthcare representative as part of my discharge preparation.  The pharmacist that spoke with me during my hospital stay was:  Mosetta Anis, [facility]  Why was Eliquis prescribed for you? Eliquis was prescribed to treat blood clots that may have been found in the veins of your legs (deep vein thrombosis) or in your lungs (pulmonary embolism) and to reduce the risk of them occurring again.  What do You need to know about Eliquis ? The starting dose is 10 mg (two 5 mg tablets) taken TWICE daily for the FIRST SEVEN (7) DAYS, then on (enter date)  06/23/18  the dose is reduced to ONE 5 mg tablet taken TWICE daily.  Eliquis may be taken with or without food.   Try to take the dose about the same time in the morning and in the evening. If you have difficulty swallowing the tablet whole please discuss with your pharmacist how to take the medication safely.  Take Eliquis exactly as prescribed and DO NOT stop taking Eliquis without talking to the doctor who prescribed the medication.  Stopping may increase your risk of developing a new blood clot.  Refill your prescription before you run out.  After discharge, you should have regular check-up appointments with your healthcare provider that is prescribing your Eliquis.    What do you do if you miss a dose? If a dose of ELIQUIS is not taken at the scheduled time, take it as soon as possible on the same day and twice-daily administration should be resumed. The dose should not be doubled to make up for a missed dose.  Important Safety Information A possible side effect of Eliquis is bleeding. You should call your healthcare provider right away if you experience any of the following: ? Bleeding from an injury or your nose that does not stop. ? Unusual colored urine (red or dark brown) or unusual colored stools (red or black). ? Unusual bruising for  unknown reasons. ? A serious fall or if you hit your head (even if there is no bleeding).  Some medicines may interact with Eliquis and might increase your risk of bleeding or clotting while on Eliquis. To help avoid this, consult your healthcare provider or pharmacist prior to using any new prescription or non-prescription medications, including herbals, vitamins, non-steroidal anti-inflammatory drugs (NSAIDs) and supplements.  This website has more information on Eliquis (apixaban): http://www.eliquis.com/eliquis/home   Pulmonary Embolism A pulmonary embolism (PE) is a sudden blockage or decrease of blood flow in one lung or both lungs. Most blockages come from a blood clot that forms in a lower leg, thigh, or arm vein (deep vein thrombosis, DVT) and travels to the lungs. A clot is blood that has thickened into a gel or solid. PE is a dangerous and life-threatening condition that needs to be treated right away. What are the causes? This condition is usually caused by a blood clot that forms in a vein and moves to the lungs. In rare cases, it may be caused by air, fat, part of a tumor, or other tissue that moves through the veins and into the lungs. What increases the risk? The following factors may make you more likely to develop this condition:  Having DVT or a history of DVT.  Being older than age 56.  Personal or family history of blood clots or blood clotting disease.  Major or lengthy surgery.  Orthopedic surgery, especially  hip or knee replacement.  Traumatic injury, such as breaking a hip or leg.  Spinal cord injury.  Stroke.  Taking medicines that contain estrogen. These include birth control pills and hormone replacement therapy.  Long-term (chronic) lung or heart disease.  Cancer and chemotherapy.  Having a central venous catheter.  Pregnancy and the period after delivery.  What are the signs or symptoms? Symptoms of this condition usually start suddenly and  include:  Shortness of breath while active or at rest.  Coughing or coughing up blood or blood-tinged mucus.  Chest pain that is often worse with deep breaths.  Rapid or irregular heartbeat.  Feeling light-headed or dizzy.  Fainting.  Feeling anxious.  Sweating.  Pain and swelling in a leg. This is a symptom of DVT, which can lead to PE.  How is this diagnosed? This condition may be diagnosed based on:  Your medical history.  A physical exam.  Blood tests to check blood oxygen level and how well your blood clots, and a D-dimer blood test, which checks your blood for a substance that is released when a blood clot breaks apart.  CT pulmonary angiogram. This test checks blood flow in and around your lungs.  Ventilation-perfusion scan, also called a lung VQ scan. This test measures air flow and blood flow to the lungs.  Ultrasound of the legs to look for blood clots.  How is this treated? Treatment for this conditions depends on many factors, such as the cause of your PE, your risk for bleeding or developing more clots, and other medical conditions you have. Treatment aims to remove, dissolve, or stop blood clots from forming or growing larger. Treatment may include:  Blood thinning medicines (anticoagulants) to stop clots from forming or growing. These medicines may be given as a pill, as an injection, or through an IV tube (infusion).  Medicines that dissolve clots (thrombolytics).  A procedure in which a flexible tube is used to remove a blood clot (embolectomy) or deliver medicine to destroy it (catheter-directed thrombolysis).  A procedure in which a filter is inserted into a large vein that carries blood to the heart (inferior vena cava). This filter (vena cava filter) catches blood clots before they reach the lungs.  Surgery to remove the clot (surgical embolectomy). This is rare.  You may need a combination of immediate, long-term (up to 3 months after diagnosis),  and extended (more than 3 months after diagnosis) treatments. Your treatment may continue for several months (maintenance therapy). You and your health care provider will work together to choose the treatment program that is best for you. Follow these instructions at home: If you are taking an anticoagulant medicine:  Take the medicine every day at the same time each day.  Understand what foods and drugs interact with your medicine.  Understand the side effects of this medicine, including excessive bruising or bleeding. Ask your health care provider or pharmacist about other side effects. General instructions  Take over-the-counter and prescription medicines only as told by your health care provider.  Anticoagulant medicines may cause side effects, including easy bruising and difficulty stopping bleeding. If you are prescribed an anticoagulant: ? Hold pressure over cuts for longer than usual. ? Tell your dentist and other health care providers that you are taking anticoagulants before you have any procedure that may cause bleeding. ? Avoid contact sports. ? Be extra careful when handling sharp objects. ? Use a soft toothbrush. Floss with waxed dental floss. ? Shave with an electric  razor.  Wear a medical alert bracelet or carry a medical alert card that says you have had a PE.  Ask your health care provider when you may return to your normal activities.  Talk with your health care provider about any travel plans. It is important to make sure that you are still able to take your medicine while on trips.  Keep all follow-up visits as told by your health care provider. This is important. How is this prevented? Take these actions to lower your risk of developing another PE:  Exercise regularly. Take frequent walks. For at least 30 minutes every day, engage in: ? Activity that involves moving your arms and legs. ? Activity that encourages good blood flow through your body by increasing  your heart rate.  While traveling, drink plenty of water and avoid drinking alcohol. Ask your health care provider if you should wear below-the-knee compression stockings.  Avoid sitting or lying in bed for long periods of time without moving your legs. Exercise your arms and legs every hour during long-distance travel (over 4 hours).  If you are hospitalized or have surgery, ask your health care provider about your risks and what treatments can help prevent blood clots.  Maintain a healthy weight. Ask your health care provider what weight is healthy for you.  If you are a woman who is over age 45, avoid unnecessary use of medicines that contain estrogen, including birth control pills.  Do not use any products that contain nicotine or tobacco, such as cigarettes and e-cigarettes. This is especially important if you take estrogen medicines. If you need help quitting, ask your health care provider.  See your health care provider for regular checkups. This may include blood tests and ultrasound testing on your legs to check for new blood clots.  Contact a health care provider if:  You missed a dose of your blood thinner medicine. Get help right away if:  You have new or increased pain, swelling, warmth, or redness in an arm or leg.  You have numbness or tingling in an arm or leg.  You have shortness of breath while active or at rest.  You have chest pain.  You have a rapid or irregular heartbeat.  You feel light-headed or dizzy.  You cough up blood.  You have blood in your vomit, stool, or urine.  You have a fever.  You have abdomen (abdominal) pain.  You have a severe fall or head injury.  You have a severe headache.  You have vision changes.  You cannot move your arms or legs.  You are confused or have memory loss.  You are bleeding for 10 minutes or more, even with strong pressure on the wound. These symptoms may represent a serious problem that is an emergency. Do  not wait to see if the symptoms will go away. Get medical help right away. Call your local emergency services (911 in the [place] Do not drive yourself to the hospital. Summary  A pulmonary embolism (PE) is a sudden blockage or decrease of blood flow in one lung or both lungs. PE is a dangerous and life-threatening condition that needs to be treated right away.  Having deep vein thrombosis (DVT) or a history of DVT is the most common risk factor for PE.  Treatments for this condition usually include medicines to thin your blood (anticoagulants) or medicines to break apart blood clots (thrombolytics).  If you are prescribed blood thinners, it is important to take the medicine every  single day at the same time each day.  If you have signs of PE or DVT, call your local emergency services (911 in the [place] This information is not intended to replace advice given to you by your health care provider. Make sure you discuss any questions you have with your health care provider. Document Released: 11/04/2000 Document Revised: 12/10/2016 Document Reviewed: 12/10/2016 Elsevier Interactive Patient Education  2018 [employer].

## 2018-06-16 NOTE — Progress Notes (Signed)
ANTICOAGULATION CONSULT NOTE - Follow Up Consult  Pharmacy Consult for heparin Indication: pulmonary embolus  Labs: Recent Labs    06/15/18 1506 06/16/18 0256  HGB 11.4* 11.3*  HCT 36.7 34.9*  PLT 296 281  LABPROT 17.5*  --   INR 1.45  --   HEPARINUNFRC  --  0.45  CREATININE 0.78  --     Assessment/Plan:  47yo female therapeutic on heparin with initial dosing for CP, now confirmed as PE. Will continue gtt at current rate and confirm stable with additional level.   Vernard GamblesVeronda Duwan Adrian, PharmD, BCPS  06/16/2018,4:03 AM

## 2018-06-16 NOTE — Progress Notes (Signed)
SATURATION QUALIFICATIONS: (This note is used to comply with regulatory documentation for home oxygen)  Patient Saturations on Room Air at Rest = 93%  Patient Saturations on Room Air while Ambulating = 97%  Patient Saturations on NA Liters of oxygen while Ambulating = NA%  Please briefly explain why patient needs home oxygen: The patient did not require oxygen during rest or ambulation.   Sheppard Evensina Javonni Macke RN

## 2018-06-16 NOTE — Progress Notes (Signed)
Spoke w patient about Eliquis card, provided w 30 day card. Per MD she will follow at [facility] for follow up and they will handle Eliquis beyond 30 days.

## 2018-06-16 NOTE — Progress Notes (Signed)
   Subjective: Hospital day 1  Today, Kara Mcclain was evaluated at bedside with family member present.  She reports of a 5/10 left-sided chest pain which is worse with deep inspiration.  She also notes that her shortness of breath has improved since admission.  Also reports of improvement with her right lower extremity swelling.  She has been able to tolerate p.o. intake with no difficulties.  Currently denies headaches, lightheadedness, palpitations, numbness or tingling of her lower extremities.  Objective:  Vital signs in last 24 hours: Vitals:   06/15/18 2124 06/16/18 0018 06/16/18 0356 06/16/18 0400  BP:  114/65 117/72   Pulse:  91 90   Resp:  20    Temp:   98.6 F (37 C)   TempSrc:   Oral   SpO2:  100% 100%   Weight: 244 lb 11.2 oz (111 kg)   245 lb 3.2 oz (111.2 kg)  Height: 5\' 11"  (1.803 m)      Physical exam Constitutional: In no acute distress, appears her stated age, alert Cardiovascular: Regular, rate, rhythm.  Normal S1, S2 heart sounds. Respiratory: Clear to auscultation bilaterally Abdomen: Soft, nondistended, nontender, normal bowel sounds present Extremities: 2+ pitting edema in the right lower extremity, mildly tender to palpation.  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  Submassive pulmonary embolism 2-day history of left-sided chest pain with right lower extremity swelling.  CT angiography in the ED showed submassive pulmonary embolism in the setting of subtherapeutic INR of 1.45.  Was subsequently started on a heparin drip.  On assessment today, there was reported improvement with shortness of breath with O2 saturation of 100%  on room air.  Left-sided chest pain had improved on Tylenol and OxyIR prn.  -Unable to report to INR clinic since March -Previously on Eliquis but uninsured and thus not able to afford medication -The team was able to contact case management who provide patient with a 30-day coupon for Eliquis -Will start Eliquis 10 mg daily follow-up with the internal medicine clinic on Monday 06/18/2018.  During this visit, we will discuss medication compliance and lifelong anticoagulation.   Dispo: Anticipated discharge in approximately today.  Kara Mcclain, Kara Urias K, MD 06/16/2018, 3:09 PM Pager: [phone]

## 2018-06-16 NOTE — Discharge Summary (Signed)
Name: Kara Mcclain MRN: [id number] DOB: 08/19/1971 [age] y.o. PCP: Kara CourserAmin, Sumayya, MD  Date of Admission: 06/15/2018  2:39 PM Date of Discharge: 06/16/2018 Attending Physician: Dr. Heide SparkNarendra, MD  Discharge Diagnosis: 1. Submassive Pulmonary Embolism 2. Hx of DVTs  Discharge Medications: Allergies as of 06/16/2018   No Known Allergies     Medication List    STOP taking these medications   cephALEXin 500 MG capsule Commonly known as:  KEFLEX   ferrous sulfate 325 (65 FE) MG EC tablet   warfarin 5 MG tablet Commonly known as:  COUMADIN     TAKE these medications   acetaminophen 325 MG tablet Commonly known as:  TYLENOL Take 650 mg by mouth every 6 (six) hours as needed for mild pain or headache.   ELIQUIS STARTER PACK 5 MG Tabs Take as directed on package: start with two-5mg  tablets twice daily for 7 days. On day 8, switch to one-5mg  tablet twice daily.   oxyCODONE 5 MG immediate release tablet Commonly known as:  Oxy IR/ROXICODONE Take 1 tablet (5 mg total) by mouth every 6 (six) hours as needed for up to 5 days for moderate pain or severe pain.       Disposition and follow-up:   [name] was discharged from [facility] in Kara [facility] condition.  At the [facility] follow up visit please address:  1.  Submassive pulmonary embolism - D/C'd warfarin at discharge. Given 30-day coupon for Eliquis 10mg   Subtherapeutic INR - 1.45  2.  Labs / imaging needed at time of follow-up: INR  3.  Pending labs/ test needing follow-up: None  Follow-up Appointments: [facility] [address] Monday   [facility] Course by problem list: 1. Submassive pulmonary embolism Ms. 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 Initial labs revealed a subtherapeutic INR of 1.45. On presentation, she was hemodynamically stable with no evidence of tachycardia. She was subsequently started on a heparin drip and reported improvement with shortness of breath. Follow up PulseOx revealed O2 saturation of 100% on room air. In addition, her  Left-sided chest pain had improved on Tylenol and OxyIR prn.   She had not had f/u with INR clinic since March and has a questionable compliance with warfarin. Case management was consulted and patient received a 30-day coupon for Eliquis 10mg .   2)History of DVTs -Previously on warfarin but unable to follow up with INR clinic  Discharge Vitals:   BP 114/67 (BP Location: Left Arm)   Pulse 81   Temp 98.1 F (36.7 C) (Oral)   Resp 18   Ht 5\' 11"  (1.803 m)   Wt 245 lb 3.2 oz (111.2 kg)   LMP 06/11/2018   SpO2 100%   BMI 34.20 kg/m   Pertinent Labs, Studies, and Procedures:  CBC Latest Ref Rng & Units 06/16/2018 06/15/2018 02/07/2018  WBC 4.0 - 10.5 K/uL 9.8 8.1 5.3  Hemoglobin 12.0 - 15.0 g/dL 11.3(L) 11.4(L) 12.0  Hematocrit 36.0 - 46.0 % 34.9(L) 36.7 35.9  Platelets 150 - 400 K/uL 281 296 378    Discharge Instructions: Discharge Instructions    Call MD for:  difficulty breathing, headache or visual disturbances   Complete by:  As directed    Call MD for:  extreme fatigue   Complete by:  As directed    Call MD for:  hives   Complete by:  As directed    Call MD for:  persistant dizziness or light-headedness   Complete by:  As directed    Call MD for:  persistant nausea and vomiting   Complete by:  As directed    Call MD for:  redness, tenderness, or signs of infection (pain, swelling, redness, odor or green/yellow discharge around incision site)   Complete by:  As directed    Call MD for:  severe uncontrolled pain   Complete by:  As directed    Call MD for:  temperature >100.4   Complete by:  As directed    Diet - low sodium heart healthy   Complete by:  As  directed    Discharge instructions   Complete by:  As directed    [name],   It was a pleasure taking care of you. You were seen at the [facility] because of a blood clot in your lungs and legs. You were treated with blood thinners. You have received a 30 day card for Eliquis. Please go to the pharmacy right after discharge.   We will see you at the clinic on Monday.  Dr. Dortha Schwalbe   Increase activity slowly   Complete by:  As directed       Signed: Yvette Rack, MD 06/16/2018, 9:18 PM   Pager: [phone]

## 2018-06-16 NOTE — Progress Notes (Addendum)
ANTICOAGULATION CONSULT NOTE - Follow up Consult  Pharmacy Consult for heparin Indication: PE  No Known Allergies  Patient Measurements: TBW 114 kg Heparin Dosing Weight: 96.2 kg  Vital Signs: Temp: 98.6 F (37 C) (07/27 0356) Temp Source: Oral (07/27 0356) BP: 117/72 (07/27 0356) Pulse Rate: 90 (07/27 0356)  Labs: Recent Labs    06/15/18 1506 06/16/18 0256 06/16/18 1041  HGB 11.4* 11.3*  --   HCT 36.7 34.9*  --   PLT 296 281  --   LABPROT 17.5*  --   --   INR 1.45  --   --   HEPARINUNFRC  --  0.45 0.46  CREATININE 0.78  --   --     Estimated Creatinine Clearance: 122 mL/min (by C-G formula based on SCr of 0.78 mg/dL).   Medical History: Past Medical History:  Diagnosis Date  . Chest pain    2/2 related to PE with component of anxiety versus pericarditis  Improves with tylenol prn a few times a week EKG with long QTc and nonspecific t wave changes. 2d echo 08/23/2013 - normal  08/26/2013 referred to cards for evaluation Lipid check ordered.   Marland Kitchen. DVT (deep venous thrombosis) (HCC)    "LLE" (07/04/2013)  . Pulmonary embolism [facility](HCC)    Assessment: 47 yo F presents with chest pain and right lower extremity edema. On Coumadin 5mg  PO daily PTA for hx of DVT. INR on admit low at 1.45. Pharmacy consulted to dose heparin for new PE.    CBC and platelets stable. No bleeding per nurse.   Heparin level therapeutic at 0.46  Goal of Therapy:  Heparin level 0.3-0.7 units/ml Monitor platelets by anticoagulation protocol: Yes   Plan:  Continue heparin 1600 units/hr Monitor daily heparin level, CBC, s/s of bleed [facility]  Gwynneth AlbrightSara Cassie Shedlock, Ilda BassetPharm D PGY1 Pharmacy Resident  Phone [phone] 06/16/2018   11:38 AM

## 2018-06-18 ENCOUNTER — Other Ambulatory Visit: Payer: Self-pay

## 2018-06-18 ENCOUNTER — Ambulatory Visit (INDEPENDENT_AMBULATORY_CARE_PROVIDER_SITE_OTHER): Payer: Self-pay | Admitting: Internal Medicine

## 2018-06-18 ENCOUNTER — Ambulatory Visit: Payer: Self-pay

## 2018-06-18 ENCOUNTER — Encounter: Payer: Self-pay | Admitting: Internal Medicine

## 2018-06-18 DIAGNOSIS — Z872 Personal history of diseases of the skin and subcutaneous tissue: Secondary | ICD-10-CM

## 2018-06-18 DIAGNOSIS — I2699 Other pulmonary embolism without acute cor pulmonale: Secondary | ICD-10-CM

## 2018-06-18 DIAGNOSIS — Z79891 Long term (current) use of opiate analgesic: Secondary | ICD-10-CM

## 2018-06-18 DIAGNOSIS — Z7901 Long term (current) use of anticoagulants: Secondary | ICD-10-CM

## 2018-06-18 DIAGNOSIS — Z86718 Personal history of other venous thrombosis and embolism: Secondary | ICD-10-CM

## 2018-06-18 DIAGNOSIS — Z86711 Personal history of pulmonary embolism: Secondary | ICD-10-CM

## 2018-06-18 DIAGNOSIS — L03115 Cellulitis of right lower limb: Secondary | ICD-10-CM

## 2018-06-18 NOTE — Patient Instructions (Signed)
Thank you for visiting clinic today. I am glad that you are doing well now. Chest pain and shortness of breath should continue to improve, you can continue using Tylenol and oxycodone to help with the pain. Continue taking Eliquis , please come to the clinic if you experience any excessive bleeding. Please follow-up in 2 weeks

## 2018-06-18 NOTE — Assessment & Plan Note (Addendum)
Patient has another recent episode of DVT and PE. She has an history of recurrent unprovoked DVT and PE and supposed to be on a lifelong anticoagulation. Initially she was on Coumadin, it was switched to Xarelto because of her noncompliance with Coumadin clinic, she is stopped taking Xarelto because of menorrhagia.  In March 2019 she presented with lower extremity swelling, venous Doppler was negative for any DVT at this time, she was treated before cellulitis, responded well to Keflex.  At that time she was advised to restart Xarelto which she does not want to do because of menorrhagia.  We asked her to start visiting Coumadin clinic and restart Coumadin, a new prescription for Coumadin was provided.  Patient never visited Coumadin clinic, according to her she continued to take Coumadin 5 mg daily, she presented again in clinic on July 22 with a small erythematous indurated area on right leg, point-of-care ultrasound was done which was more consistent with inflammation, her symptoms were more consistent with either thrombophlebitis or localized cellulitis secondary to folliculitis, she was given a course of Keflex, we checked her INR and it was 1.1, she was given instructions to increase her Coumadin dose and an appointment was made for Wednesday in Coumadin clinic which she missed.  During her hospital visit on Friday her INR was 1.4.  Had a long discussion with patient regarding being compliant with anticoagulation, patient should remain on lifelong anticoagulation because of recurrent DVT and PEs.  At this time patient is taking Eliquis regularly. Her symptoms of pleuritic chest pain and exertional dyspnea are improving although she is still having some of those symptoms.  -Continue Eliquis. -Follow-up in 2 weeks-to see resolution of her symptoms. -She was advised to go to emergency room if her symptoms get worse.

## 2018-06-18 NOTE — Assessment & Plan Note (Signed)
There was no erythema or edema of right lower extremity today. According to patient her symptoms initially started improving with the start of antibiotics on Monday. Her antibiotics were stopped during her current hospitalization where she was admitted for recurrent PE. She took antibiotics for 4 days.

## 2018-06-18 NOTE — Progress Notes (Signed)
   CC: For hospital follow-up, recently admitted due to submassive PE.  HPI:  [name] is a 47 y.o. with past medical history significant for recurrent DVTs and PE came to the clinic for her hospital follow-up.  She was recently admitted [facility] from July 26 ~27 due to chest pain and shortness of breath and found to have a submassive PE.  She was seen in the clinic last Monday on July 22, with left leg pain and localized swelling, more consistent with thrombophlebitis versus cellulitis, she was given a course of Keflex, Keflex initially helped with her swelling, on Wednesday morning she woke up with left leg generalized swelling and pain, she never seek medical attention at that time, on Friday morning while taking a shower she developed sudden chest pain and shortness of breath, for which she was taken to the emergency room found to have bilateral submassive PE, left little worse than right.  Her INR was 1.4 during her ED visit, it was still subtherapeutic, her INR during clinic visit was 1.1, she was again requested to follow-up at Coumadin clinic and start taking her Coumadin regularly, according to patient she was taking her Coumadin.  She was supposed to follow-up on Coumadin clinic on Wednesday which she never did. Patient was started on Eliquis during current hospitalization, according to her she is compliant.  She denies any obvious sign of bleeding. She continued to experience some pleuritic type chest pain and exertional dyspnea, although improving as compared to before.  She is using oxycodone at bedtime and Tylenol during the day to help with the pain.  Her leg swelling has been improved.  Past Medical History:  Diagnosis Date  . Chest pain    2/2 related to PE with component of anxiety versus pericarditis  Improves with tylenol prn a few times a week EKG with long QTc and nonspecific t wave changes. 2d echo 08/23/2013 - normal  08/26/2013 referred to cards for  evaluation Lipid check ordered.   Marland Kitchen. DVT (deep venous thrombosis) (HCC)    "LLE" (07/04/2013)  . Pulmonary embolism (HCC)    Review of Systems: Negative except mentioned in HPI.  Physical Exam:  Vitals:   06/18/18 1555  BP: 130/60  Pulse: 96  Temp: 98.2 F (36.8 C)  TempSrc: Oral  SpO2: 100%  Weight: 249 lb 3.2 oz (113 kg)  Height: 5\' 11"  (1.803 m)    General: Vital signs reviewed.  Patient is well-developed and well-nourished, in no acute distress and cooperative with exam.  Head: Normocephalic and atraumatic. Cardiovascular: RRR, S1 normal, S2 normal, no murmurs, gallops, or rubs. Pulmonary/Chest: Clear to auscultation bilaterally, no wheezes, rales, or rhonchi. Abdominal: Soft, non-tender, non-distended, BS +,  Extremities: Trace lower extremity edema bilaterally,  pulses symmetric and intact bilaterally.  Skin: Warm, dry and intact. No rashes or erythema. Psychiatric: Normal mood and affect. speech and behavior is normal. Cognition and memory are normal.  Assessment & Plan:   See Encounters Tab for problem based charting.  Patient discussed with Dr. Criselda PeachesMullen.

## 2018-06-24 NOTE — Progress Notes (Signed)
Internal Medicine Clinic Attending  Case discussed with Dr. Amin at the time of the visit.  We reviewed the resident's history and exam and pertinent patient test results.  I agree with the assessment, diagnosis, and plan of care documented in the resident's note.    

## 2018-06-25 ENCOUNTER — Encounter: Payer: Self-pay | Admitting: Internal Medicine

## 2018-06-25 ENCOUNTER — Ambulatory Visit (INDEPENDENT_AMBULATORY_CARE_PROVIDER_SITE_OTHER): Payer: Self-pay | Admitting: Internal Medicine

## 2018-06-25 ENCOUNTER — Other Ambulatory Visit: Payer: Self-pay

## 2018-06-25 DIAGNOSIS — M542 Cervicalgia: Secondary | ICD-10-CM

## 2018-06-25 DIAGNOSIS — M25519 Pain in unspecified shoulder: Secondary | ICD-10-CM | POA: Insufficient documentation

## 2018-06-25 DIAGNOSIS — M25512 Pain in left shoulder: Secondary | ICD-10-CM

## 2018-06-25 DIAGNOSIS — Z86711 Personal history of pulmonary embolism: Secondary | ICD-10-CM

## 2018-06-25 DIAGNOSIS — G8911 Acute pain due to trauma: Secondary | ICD-10-CM

## 2018-06-25 DIAGNOSIS — Z7901 Long term (current) use of anticoagulants: Secondary | ICD-10-CM
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
## 2018-06-25 NOTE — Patient Instructions (Signed)
Kara Mcclain,   For your pain, please take Tylenol every 6 hours for the next 3-4 days. If your soreness has not improved in the next week, please give us call and let us know. Please call us if you have any questions.   - Dr. Evelene CroonSantos
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
## 2018-06-26 NOTE — Progress Notes (Signed)
Internal Medicine Clinic Attending  Case discussed with Dr. Santos-Sanchez at the time of the visit.  We reviewed the resident's history and exam and pertinent patient test results.  I agree with the assessment, diagnosis, and plan of care documented in the resident's note.    

## 2018-06-28 ENCOUNTER — Telehealth: Payer: Self-pay | Admitting: Internal Medicine

## 2018-06-28 NOTE — Telephone Encounter (Signed)
Agree with plan.  Dr. Nelson ChimesAmin is currently on vacation.

## 2018-06-28 NOTE — Telephone Encounter (Signed)
Thanks Myriam JacobsonHelen! [facility]

## 2018-06-28 NOTE — Telephone Encounter (Signed)
Patient is requesting pain medicine, pt contact # [phone] @ [facility]

## 2018-06-28 NOTE — Telephone Encounter (Signed)
Pt calls and states she is continuing to have the pain from the clot pressing against her chest, heart and lungs, she states she really needs something stronger for pain than tylenol. States she has been going to a chiropractor but the pain last night caused her too much sleep loss. She denies short of breath or chest pain, n&v, weakness. appt [facility] 8/9 at 1015

## 2018-06-28 NOTE — Telephone Encounter (Signed)
Patient is requesting pain medicine, pt contact # [phone]

## 2018-06-29 ENCOUNTER — Ambulatory Visit: Payer: Self-pay

## 2018-07-04 ENCOUNTER — Encounter: Payer: Self-pay | Admitting: Internal Medicine

## 2018-07-04 ENCOUNTER — Ambulatory Visit: Payer: Self-pay | Admitting: Pharmacist

## 2018-07-04 ENCOUNTER — Ambulatory Visit: Payer: Self-pay

## 2018-07-15 ENCOUNTER — Other Ambulatory Visit: Payer: Self-pay | Admitting: Internal Medicine

## 2018-07-16 ENCOUNTER — Other Ambulatory Visit: Payer: Self-pay | Admitting: Internal Medicine

## 2018-07-16 MED ORDER — APIXABAN 5 MG PO TABS
5.0000 mg | ORAL_TABLET | Freq: Two times a day (BID) | ORAL | 3 refills | Status: DC
Start: 2018-07-16 — End: 2018-10-29

## 2018-07-16 NOTE — Telephone Encounter (Signed)
[facility] [address]. Left message for patient on VM requesting return call to verify if she wanted Eliquis sent there or at [facility]. Kinnie FeilL. [place]atte, RN, BSN

## 2018-07-16 NOTE — Telephone Encounter (Signed)
Sent new prescription to [facility].

## 2018-07-17 ENCOUNTER — Other Ambulatory Visit: Payer: Self-pay | Admitting: Obstetrics and Gynecology

## 2018-07-17 DIAGNOSIS — Z1231 Encounter for screening mammogram for malignant neoplasm of breast: Secondary | ICD-10-CM

## 2018-07-20 ENCOUNTER — Ambulatory Visit: Payer: Self-pay | Admitting: Pharmacist

## 2018-07-20 ENCOUNTER — Encounter: Payer: Self-pay | Admitting: Internal Medicine

## 2018-07-20 ENCOUNTER — Ambulatory Visit: Payer: Self-pay

## 2018-09-06 ENCOUNTER — Ambulatory Visit (HOSPITAL_COMMUNITY): Payer: Self-pay

## 2018-10-29 ENCOUNTER — Ambulatory Visit: Payer: Self-pay | Admitting: Internal Medicine

## 2018-10-29 ENCOUNTER — Encounter: Payer: Self-pay | Admitting: Internal Medicine

## 2018-10-29 ENCOUNTER — Other Ambulatory Visit: Payer: Self-pay

## 2018-10-29 VITALS — BP 128/73 | HR 105 | Temp 98.5°F | Ht 71.0 in | Wt 250.6 lb

## 2018-10-29 DIAGNOSIS — I82409 Acute embolism and thrombosis of unspecified deep veins of unspecified lower extremity: Secondary | ICD-10-CM | POA: Insufficient documentation

## 2018-10-29 DIAGNOSIS — I82412 Acute embolism and thrombosis of left femoral vein: Secondary | ICD-10-CM

## 2018-10-29 DIAGNOSIS — Z7901 Long term (current) use of anticoagulants: Secondary | ICD-10-CM

## 2018-10-29 DIAGNOSIS — I2699 Other pulmonary embolism without acute cor pulmonale: Secondary | ICD-10-CM

## 2018-10-29 DIAGNOSIS — Z86718 Personal history of other venous thrombosis and embolism: Secondary | ICD-10-CM

## 2018-10-29 DIAGNOSIS — M79605 Pain in left leg: Secondary | ICD-10-CM

## 2018-10-29 DIAGNOSIS — Z86711 Personal history of pulmonary embolism: Secondary | ICD-10-CM

## 2018-10-29 MED ORDER — RIVAROXABAN 15 MG PO TABS: 15.0000 mg | ORAL_TABLET | Freq: Two times a day (BID) | ORAL | 0 refills | Status: DC

## 2018-10-29 MED FILL — XARELTO 15 MG TABLET: 15 | 14 days supply | Qty: 28 | Fill #0

## 2018-10-29 NOTE — Patient Instructions (Signed)
Thank you for allowing us to care for you  Your leg pain is consistent with a new DVT considering your history of this and being off your medication - Take Xarelto 15mg  Twice a day for 21 Days. We have provided you with the first week of this. - Will send the other 14 days of pills in to the pharmacy [facility] - You will be contacted by our pharmacist with further instructions  If you experience severe symptoms such as shortness of breath, fast hear rate, or other concerning symptoms please head to the ED for evaluation

## 2018-10-29 NOTE — Assessment & Plan Note (Addendum)
Patient presented with 3 days of pain in her L inner thigh. The pain is described as a 3-8/10 (8 with walking, 3 at rest), Heavy/tight pain. She states it feels similar to her previous DVTs. On exam she has mild tneder to palpation of her proximal inner thigh and mildly increasededema of her LLE.   She has an extensive history of DVT and PE. First unprovoked in 2014 with Recurrence in 2015 and 2019 due to subtherapeutic INR. Following admission for DVT/PE earlier this year she was started on Eliquis given her history of subtherapeutic INR. She was discharged with 1 month of the medication, but states she had difficulty affording this due to her lack of insurance and switched her self back to Warfarin when she ran out in September. She did not inform her providers of this change and has not been having her INR checked. She then ran out of her Warfarin as well on 2 days ago. Revised Geneva score 15 (high likely hood of DVT) and PESI score of 55 (Lower risk of mortality if PE).  Given this history and her presentation her pain is consistent with new LLE DVT. Will not pursue further imaging at this point as it will not alter our course of treatment as she is to be on lifelong anticoagulation and will need to have this initiated regardless of the test results. Given her hemodynamic stability, lack of clinically significant symptoms for DVT, and low risk PESI score will initiate anticoagulation as an outpatin - Started on Xarelto 15mg  BID for 21 days (Give first week of samples and took first dose in clinic). - Will need 20mg  Daily thereafter - Will message Pharmacy/Anticoagulation clinic for follow up

## 2018-10-29 NOTE — Assessment & Plan Note (Signed)
Patient presented with signs, symptoms, and history consistent with DVT of LLE. Please see separate DVT note for details - Started on Xarelto 15mg  BID for 21 days (Give first week of samples and took first dose in clinic). - Will need 20mg  Daily there after - Will message Pharmacy/Anticoagulation clinic for follow up

## 2018-10-29 NOTE — Progress Notes (Signed)
   CC: Leg pain  HPI:  [name] is a 47 y.o. F with PMHx listed below presenting for Leg Pain. Please see the A&P for the status of the patient's chronic medical problems.    Past Medical History:  Diagnosis Date  . Chest pain    2/2 related to PE with component of anxiety versus pericarditis  Improves with tylenol prn a few times a week EKG with long QTc and nonspecific t wave changes. 2d echo 08/23/2013 - normal  08/26/2013 referred to cards for evaluation Lipid check ordered.   Marland Kitchen. DVT (deep venous thrombosis) (HCC)    "LLE" (07/04/2013)  . Pulmonary embolism (HCC)    Review of Systems:  Performed and all others negative.  Physical Exam:  Vitals:   10/29/18 1133  BP: 128/73  Pulse: (!) 105  Temp: 98.5 F (36.9 C)  TempSrc: Oral  SpO2: 100%  Weight: 250 lb 9.6 oz (113.7 kg)  Height: 5\' 11"  (1.803 m)   Physical Exam  Constitutional: She appears well-developed and well-nourished. No distress.  Cardiovascular: Normal rate, regular rhythm, normal heart sounds and intact distal pulses.  Pulmonary/Chest: Effort normal and breath sounds normal. No respiratory distress.  Abdominal: Soft. Bowel sounds are normal. She exhibits no distension. There is no tenderness.  Musculoskeletal: She exhibits edema and tenderness. She exhibits no deformity.  LLE with mild tenderness to palppation of proximal inner thigh Bilateral non-pitting LE edema L>R  Skin: Skin is warm and dry.     Assessment & Plan:   See Encounters Tab for problem based charting.  Patient discussed with Dr. Rogelia BogaButcher

## 2018-10-30 ENCOUNTER — Telehealth: Payer: Self-pay

## 2018-10-30 DIAGNOSIS — I82412 Acute embolism and thrombosis of left femoral vein: Secondary | ICD-10-CM

## 2018-10-30 MED ORDER — OXYCODONE HCL 5 MG PO TABS: 5.0000 mg | ORAL_TABLET | Freq: Four times a day (QID) | ORAL | 0 refills | Status: AC | PRN

## 2018-10-30 MED FILL — oxyCODONE HCL 5 MG TABS: 5 | 5 days supply | Qty: 20 | Fill #0

## 2018-10-30 NOTE — Telephone Encounter (Signed)
Requesting to speak with a nurse about meds. Please call pt back.  

## 2018-10-30 NOTE — Telephone Encounter (Signed)
Pt calls and states she is doing exactly as you ask. She has 2 problems 1) taking tylenol as you ask but it only last for appr 30mins, she states she is not resting and cannot really walk. Is there anything else you can prescribe to help with the pain? Also in aug when she saw dr Mady Haagensenamin ashe had an area on her ankle that appeared to be cellulitis, she stated she thought she ask for you to address that yesterday but maybe she forgot, dr Nelson Chimesamin gave her abx for it, it is back too. Please advise

## 2018-10-30 NOTE — Telephone Encounter (Signed)
Contacted patient regarding her concerns for pain and skin changes on her left leg (which she was concerned was infection).   For her continue leg pain due to presumed DVT. I prescribed a short course of pain medication as it can take several days for pain to begin to subside from DVT. Patient advised to report to the ED over the weekend or the clinic during the week if pain or swelling acutely worsens or fails to improve on anticoagulation and pain medication after a few days. Patient states she has been taking her Xarelto and using her stockings. - Oxycodone IR 4 mg q8h, PRN Pain  For her concern for infection; we discussed that she has no concerning symptoms. She has no fever, no erythema or tenderness of that region. Difficult to tell if she had any acute edema of that area during her visit as she has some acute on chronic edema of the entire leg with her DVT. - Continue to monitor - Return precautions given

## 2018-10-30 NOTE — Progress Notes (Signed)
Internal Medicine Clinic Attending  Case discussed with Dr. Alinda MoneyMelvin at the time of the visit.  We reviewed the resident's history and exam and pertinent patient test results.  I agree with the assessment, diagnosis, and plan of care documented in the resident's note.  She is at risk for post-thrombotic syndrome but has compression stockings at home.

## 2018-11-30 ENCOUNTER — Encounter: Payer: Self-pay | Admitting: Internal Medicine

## 2018-11-30 ENCOUNTER — Ambulatory Visit: Payer: Self-pay

## 2018-12-03 ENCOUNTER — Encounter: Payer: Self-pay | Admitting: Internal Medicine

## 2018-12-04 ENCOUNTER — Encounter: Payer: Self-pay | Admitting: Internal Medicine

## 2018-12-28 ENCOUNTER — Other Ambulatory Visit: Payer: Self-pay

## 2018-12-28 ENCOUNTER — Encounter: Payer: Self-pay | Admitting: Internal Medicine

## 2018-12-28 ENCOUNTER — Ambulatory Visit: Payer: Self-pay | Admitting: Internal Medicine

## 2018-12-28 VITALS — BP 121/77 | HR 87 | Temp 97.7°F | Ht 71.0 in | Wt 261.2 lb

## 2018-12-28 DIAGNOSIS — N920 Excessive and frequent menstruation with regular cycle: Secondary | ICD-10-CM

## 2018-12-28 DIAGNOSIS — I82409 Acute embolism and thrombosis of unspecified deep veins of unspecified lower extremity: Secondary | ICD-10-CM

## 2018-12-28 DIAGNOSIS — I2699 Other pulmonary embolism without acute cor pulmonale: Secondary | ICD-10-CM

## 2018-12-28 DIAGNOSIS — Z7901 Long term (current) use of anticoagulants: Secondary | ICD-10-CM

## 2018-12-28 DIAGNOSIS — M793 Panniculitis, unspecified: Secondary | ICD-10-CM

## 2018-12-28 NOTE — Assessment & Plan Note (Signed)
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  She was last seen in our clinic in December with lower extremity leg swelling presumed to be a DVT while off anticoagulation.  Imaging was not obtained to confirm diagnosis as this would not have changed management.  She was restarted on Xarelto, unfortunately has not followed up since.  Since running out of her prescription, she reports taking her mom's Xarelto.  She is unsure of the dose.  She is requesting to change to Eliquis after reading that it may have less side effect of menorrhagia.  Informed patient that she is likely at the same her risk for bleeding regardless of anticoagulant, but I am willing to prescribe whichever NOAC she will take.  She has previously stopped anticoagulation due to her menorrhagia which is very bothersome to her.  Unfortunately she is uninsured making access to Eliquis difficult.  She has spoken with our clinic pharmacist about this potential change who is willing to help her get on a drug assistance program.  She has been provided with Eliquis samples today.  Instructed to take 10 mg twice daily x1 week then 5 mg twice daily indefinitely.  She will follow-up with [place] again in 1 week and meet with our pharmacist at that time. --Stop Xarelto -Start Eliquis 10 mg twice daily x1 week then 5 mg twice daily indefinitely --Follow-up 1 week -- Samples of Eliquis were given to the patient, quantity 32 tablets, Lot Number [id number], Exp date Oct 2021

## 2018-12-28 NOTE — Assessment & Plan Note (Signed)
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  Advised OTC topical hydrocortisone 1% BID x 2 weeks.  If no improvement, may need to try higher potency topical corticosteroid.  Unfortunately she is self-pay at this time.  Informed patient that changes may be permanent. --Follow-up as needed

## 2018-12-28 NOTE — Assessment & Plan Note (Signed)
Patient has side effect of menorrhagia while on anticoagulation with prolonged vaginal bleeding.  This has been the reason for her previously stopping anticoagulation.  Since her last DVT in December, she has been compliant with Xarelto.  Requesting referral to gynecology. -Referral placed

## 2018-12-28 NOTE — Patient Instructions (Signed)
Kara Mcclain,  It was a pleasure to meet you. I have given you samples of Eliquis. Please take 2 tablets twice a day x 1 week then one tablet twice a day. Follow up with [place] again in 1 week.   For your leg, you have something called panniculitis which is scarring from your prior DVTs. You may use over the counter hydrocortisone twice a day for 2 weeks. If that does not improve, please follow up with [place]. If you have any questions or concerns, call our clinic at [phone] or after hours call [phone] and ask for the internal medicine resident on call. Thank you!  Dr. Antony Contras

## 2018-12-28 NOTE — Progress Notes (Signed)
   CC: DVT follow up  HPI:  [name] is a 48 y.o. female with past medical history outlined below here for DVT follow up. For the details of today's visit, please refer to the assessment and plan.  Past Medical History:  Diagnosis Date  . Chest pain    2/2 related to PE with component of anxiety versus pericarditis  Improves with tylenol prn a few times a week EKG with long QTc and nonspecific t wave changes. 2d echo 08/23/2013 - normal  08/26/2013 referred to cards for evaluation Lipid check ordered.   Marland Kitchen DVT (deep venous thrombosis) (HCC)    "LLE" (07/04/2013)  . Pulmonary embolism (HCC)     Review of Systems  Respiratory: Negative for shortness of breath.   Cardiovascular: Negative for chest pain.    Physical Exam:  Vitals:   12/28/18 1608  BP: 121/77  Pulse: 87  Temp: 97.7 F (36.5 C)  TempSrc: Oral  SpO2: 100%  Weight: 261 lb 3.2 oz (118.5 kg)  Height: 5\' 11"  (1.803 m)    Constitutional: NAD, appears comfortable Cardiovascular: RRR, no m/r/g Pulmonary/Chest: CTAB, no wheezes, rales, or rhonchi.  Extremities: Warm and well perfused. No edema. Small area of firm indentation with overlying discoloration of her left lower extremity, consistent with panniculitis   Psychiatric: Normal mood and affect  Assessment & Plan:   See Encounters Tab for problem based charting.  Patient discussed with Dr. Heide Spark

## 2018-12-31 NOTE — Progress Notes (Signed)
Internal Medicine Clinic Attending  Case discussed with Dr. Guilloud at the time of the visit.  We reviewed the resident's history and exam and pertinent patient test results.  I agree with the assessment, diagnosis, and plan of care documented in the resident's note.  

## 2019-01-15 ENCOUNTER — Telehealth: Payer: Self-pay

## 2019-01-15 NOTE — Telephone Encounter (Signed)
Needs to speak with you about meds. Please call pt back.

## 2019-01-17 ENCOUNTER — Ambulatory Visit: Payer: Self-pay | Admitting: Pharmacist

## 2019-02-28 ENCOUNTER — Encounter: Payer: Self-pay | Admitting: Obstetrics & Gynecology

## 2019-02-28 ENCOUNTER — Other Ambulatory Visit: Payer: Self-pay | Admitting: Internal Medicine

## 2019-02-28 DIAGNOSIS — I82409 Acute embolism and thrombosis of unspecified deep veins of unspecified lower extremity: Secondary | ICD-10-CM

## 2019-02-28 NOTE — Telephone Encounter (Addendum)
Spoke to patient to coordinate Eliquis samples and patient assistance program  Lot [id number], Exp 6/22, qty 4 boxes (56 tablets)

## 2019-02-28 NOTE — Telephone Encounter (Signed)
Pt is wanting to get another sample Eliguis 5mg  pls contact pt  [phone]

## 2019-03-05 MED ORDER — APIXABAN 5 MG PO TABS: 5.0000 mg | ORAL_TABLET | Freq: Two times a day (BID) | ORAL | 3 refills | Status: DC

## 2019-03-05 NOTE — Addendum Note (Signed)
Addended by: Mliss Fritz on: 03/05/2019 11:05 AM   Modules accepted: Orders

## 2019-03-12 ENCOUNTER — Ambulatory Visit (INDEPENDENT_AMBULATORY_CARE_PROVIDER_SITE_OTHER): Payer: Self-pay | Admitting: Advanced Practice Midwife

## 2019-03-12 ENCOUNTER — Encounter: Payer: Self-pay | Admitting: Advanced Practice Midwife

## 2019-03-12 DIAGNOSIS — N939 Abnormal uterine and vaginal bleeding, unspecified: Secondary | ICD-10-CM

## 2019-03-12 NOTE — Progress Notes (Signed)
TELEHEALTH VIRTUAL GYNECOLOGY VISIT ENCOUNTER NOTE  I connected with Si Gaul on 03/12/19 at  3:55 PM EDT by telephone at home and verified that I am speaking with the correct person using two identifiers.   I discussed the limitations, risks, security and privacy concerns of performing an evaluation and management service by telephone and the availability of in person appointments. I also discussed with the patient that there may be a patient responsible charge related to this service. The patient expressed understanding and agreed to proceed.   History:  ROKAYA BOGACZ is a 48 y.o. G21P0010 female being evaluated today for abnormal uterine bleeding. She denies any abnormal vaginal discharge, bleeding, pelvic pain or other concerns.     Patient has a history of PE/DVT, and is on elquis BID.   Patient saw PCP in February and was having heavy periods. They decreased her eliquis at that time to once per day. Then she started having leg swelling so they increased the medication back to 2x per day. She was on OCPs from age [age] years of age. No known cause of PE/DVT. There is family history of blood clots. She was told it could have been the OCPs. Patient's sister has had blood clots, and her sister had some genetic testing done. Unsure of those results.   Vaginal Bleeding  The patient's primary symptoms include vaginal bleeding. This is a new problem. The current episode started more than 1 month ago. The problem occurs intermittently. The problem has been unchanged. Vaginal bleeding amount: heavy for 3-4 days, then light for 1-2 days and then heavy again for about 3 days. Cycles lasting about 8 days now. Used to be about 4 days. Periods are less painful than before starting on blood thinners.  Her menstrual history has been regular (LMP 02/20/2019).     Past Medical History:  Diagnosis Date  . Chest pain    2/2 related to PE with component of anxiety versus pericarditis  Improves with  tylenol prn a few times a week EKG with long QTc and nonspecific t wave changes. 2d echo 08/23/2013 - normal  08/26/2013 referred to cards for evaluation Lipid check ordered.   Marland Kitchen DVT (deep venous thrombosis) (HCC)    "LLE" (07/04/2013)  . Pulmonary embolism [facility])    Past Surgical History:  Procedure Laterality Date  . DIAGNOSTIC LAPAROSCOPY WITH REMOVAL OF ECTOPIC PREGNANCY N/A 11/16/2014   Procedure: DIAGNOSTIC LAPAROSCOPY WITH REMOVAL OF ECTOPIC PREGNANCY;  Surgeon: Tilda Burrow, MD;  Location: [facility];  Service: Gynecology;  Laterality: N/A;  . NO PAST SURGERIES     The following portions of the patient's history were reviewed and updated as appropriate: allergies, current medications, past family history, past medical history, past social history, past surgical history and problem list.   Health Maintenance:  Unsure of date of last pap.  Normal mammogram approx 3 years ago.   Review of Systems:  Pertinent items noted in HPI and remainder of comprehensive ROS otherwise negative.  Physical Exam:   General:  Alert, oriented and cooperative.   Mental Status: Normal mood and affect perceived. Normal judgment and thought content.  Physical exam deferred due to nature of the encounter  Labs and Imaging No results found for this or any previous visit (from the past 336 hour(s)). No results found.    Assessment and Plan:       1. Abnormal uterine bleeding (AUB)    - Message sent to [facility] for pap and mammo -  Patient reassured that bleeding is likely due to blood thinner medications - Discussed option for Mirena IUD if bleeding continues or does not improve. Will also need endometrial biopsy. Will have patient return in about 3 months for FU/in-person visit for endometrial biopsy and further discussion regarding IUD.  - She has not ruled out having a baby and is not ready to commit to IUD until this summer as she is aware at this time that likelihood of achieving pregnancy is decreasing.    I discussed the assessment and treatment plan with the patient. The patient was provided an opportunity to ask questions and all were answered. The patient agreed with the plan and demonstrated an understanding of the instructions.   The patient was advised to call back or seek an in-person evaluation/go to the ED if the symptoms worsen or if the condition fails to improve as anticipated.  I provided 30 minutes of non-face-to-face time during this encounter.   Thressa ShellerHeather Elzabeth Mcquerry DNP, CNM  03/12/19  4:29 PM  [facility]

## 2019-03-12 NOTE — Progress Notes (Signed)
I connected with  Kara Mcclain on 03/12/19 at  3:55 PM EDT by telephone and verified that I am speaking with the correct person using two identifiers.   I discussed the limitations, risks, security and privacy concerns of performing an evaluation and management service by telephone and the availability of in person appointments. I also discussed with the patient that there may be a patient responsible charge related to this service. The patient expressed understanding and agreed to proceed.  Ernestina Patches, CMA 03/12/2019  3:33 PM  Taking Eliquis x2 daily states its helping bleeding. Pain on right side underneath breast / side boob, states a cyst there before 7-8 years ago in the past, to ensure with age to have bi yearly mammogram and hasn't had one in 3 years. Talk about [facility] for patient.

## 2019-04-03 ENCOUNTER — Other Ambulatory Visit: Payer: Self-pay | Admitting: Internal Medicine

## 2019-04-03 DIAGNOSIS — Z1231 Encounter for screening mammogram for malignant neoplasm of breast: Secondary | ICD-10-CM

## 2019-04-11 ENCOUNTER — Encounter: Payer: Self-pay | Admitting: Pharmacist

## 2019-04-11 NOTE — Progress Notes (Signed)
Patient was approved for Eliquis patient assistance program

## 2019-04-12 NOTE — Progress Notes (Signed)
Thank you :)

## 2019-05-09 ENCOUNTER — Telehealth: Payer: Self-pay | Admitting: Internal Medicine

## 2019-05-09 NOTE — Telephone Encounter (Signed)
Patient asking about availability of eliquis samples.  Asked that patient call the clinic in the morning to check for availability.  Patient is agreeable.

## 2019-05-09 NOTE — Telephone Encounter (Signed)
Received a call from Ms. Heigl, I returned her call but she did not pick up and her voicemail was full. I also attempted to call the patient's mother who said she will reach out to Ms. Feger. I told her to call back our after hours number again.   Lars Mage, MD Internal Medicine PGY2 ASNKN:[phone] 05/09/2019, 7:08 PM

## 2019-05-16 NOTE — Telephone Encounter (Signed)
Tried calling patient back to help with Eliquis. Patient is enrolled in the free manufacturer assistance program so needs to call for refills [phone]. Can provide samples but patient really should be set through the free program for 1 year.   Unable to leave voicemail for patient to call back.

## 2019-05-17 ENCOUNTER — Telehealth: Payer: Self-pay

## 2019-05-17 NOTE — Telephone Encounter (Signed)
Pt is returning a call, please call pt back.

## 2019-05-20 ENCOUNTER — Encounter: Payer: Self-pay | Admitting: *Deleted

## 2019-05-20 ENCOUNTER — Encounter: Payer: Self-pay | Admitting: Internal Medicine

## 2019-05-20 ENCOUNTER — Ambulatory Visit: Payer: Self-pay | Admitting: Internal Medicine

## 2019-05-20 ENCOUNTER — Ambulatory Visit: Payer: Self-pay | Admitting: Pharmacist

## 2019-05-20 ENCOUNTER — Other Ambulatory Visit: Payer: Self-pay

## 2019-05-20 VITALS — BP 135/78 | HR 79 | Temp 98.2°F | Ht 71.0 in | Wt 263.1 lb

## 2019-05-20 DIAGNOSIS — I872 Venous insufficiency (chronic) (peripheral): Secondary | ICD-10-CM

## 2019-05-20 DIAGNOSIS — M25472 Effusion, left ankle: Secondary | ICD-10-CM | POA: Insufficient documentation

## 2019-05-20 DIAGNOSIS — I82412 Acute embolism and thrombosis of left femoral vein: Secondary | ICD-10-CM

## 2019-05-20 DIAGNOSIS — Z7901 Long term (current) use of anticoagulants: Secondary | ICD-10-CM

## 2019-05-20 NOTE — Patient Instructions (Addendum)
Thank you for visiting clinic today. It does not look like clotting in your legs at this time, most likely your veins have have been collapsed after DVT and causing venous insufficiency. You are being measured for compression stockings today. Use compression stockings when on your feet during the day. Keep your legs elevated while sitting. Follow-up in 81-month.   Chronic Venous Insufficiency Chronic venous insufficiency is a condition where the leg veins cannot effectively pump blood from the legs to the heart. This happens when the vein walls are either stretched, weakened, or damaged, or when the valves inside the vein are damaged. With the right treatment, you should be able to continue with an active life. This condition is also called venous stasis. What are the causes? Common causes of this condition include:  High blood pressure inside the veins (venous hypertension).  Sitting or standing too long, causing increased blood pressure in the leg veins.  A blood clot that blocks blood flow in a vein (deep vein thrombosis, DVT).  Inflammation of a vein (phlebitis) that causes a blood clot to form.  Tumors in the pelvis that cause blood to back up. What increases the risk? The following factors may make you more likely to develop this condition:  Having a family history of this condition.  Obesity.  Pregnancy.  Living without enough regular physical activity or exercise (sedentary lifestyle).  Smoking.  Having a job that requires long periods of standing or sitting in one place.  Being a certain age. Women in their 31s and 25s and men in their 76s are more likely to develop this condition. What are the signs or symptoms? Symptoms of this condition include:  Veins that are enlarged, bulging, or twisted (varicose veins).  Skin breakdown or ulcers.  Reddened skin or dark discoloration of skin on the leg between the knee and ankle.  Brown, smooth, tight, and painful skin  just above the ankle, usually on the inside of the leg (lipodermatosclerosis).  Swelling of the legs. How is this diagnosed? This condition may be diagnosed based on:  Your medical history.  A physical exam.  Tests, such as: ? A procedure that creates an image of a blood vessel and nearby organs and provides information about blood flow through the blood vessel (duplex ultrasound). ? A procedure that tests blood flow (plethysmography). ? A procedure that looks at the veins using X-ray and dye (venogram). How is this treated? The goals of treatment are to help you return to an active life and to minimize pain or disability. Treatment depends on the severity of your condition, and it may include:  Wearing compression stockings. These can help relieve symptoms and help prevent your condition from getting worse. However, they do not cure the condition.  Sclerotherapy. This procedure involves an injection of a solution that shrinks damaged veins.  Surgery. This may involve: ? Removing a diseased vein (vein stripping). ? Cutting off blood flow through the vein (laser ablation surgery). ? Repairing or reconstructing a valve within the affected vein. Follow these instructions at home:      Wear compression stockings as told by your health care provider. These stockings help to prevent blood clots and reduce swelling in your legs.  Take over-the-counter and prescription medicines only as told by your health care provider.  Stay active by exercising, walking, or doing different activities. Ask your health care provider what activities are safe for you and how much exercise you need.  Drink enough fluid to keep  your urine pale yellow.  Do not use any products that contain nicotine or tobacco, such as cigarettes, e-cigarettes, and chewing tobacco. If you need help quitting, ask your health care provider.  Keep all follow-up visits as told by your health care provider. This is  important. Contact a health care provider if you:  Have redness, swelling, or more pain in the affected area.  See a red streak or line that goes up or down from the affected area.  Have skin breakdown or skin loss in the affected area, even if the breakdown is small.  Get an injury in the affected area. Get help right away if:  You get an injury and an open wound in the affected area.  You have: ? Severe pain that does not get better with medicine. ? Sudden numbness or weakness in the foot or ankle below the affected area. ? Trouble moving your foot or ankle. ? A fever. ? Worse or persistent symptoms. ? Chest pain. ? Shortness of breath. Summary  Chronic venous insufficiency is a condition where the leg veins cannot effectively pump blood from the legs to the heart.  Chronic venous insufficiency occurs when the vein walls become stretched, weakened, or damaged, or when valves within the vein are damaged.  Treatment depends on how severe your condition is. It often involves wearing compression stockings and may involve having a procedure.  Make sure you stay active by exercising, walking, or doing different activities. Ask your health care provider what activities are safe for you and how much exercise you need. This information is not intended to replace advice given to you by your health care provider. Make sure you discuss any questions you have with your health care provider. Document Released: 03/13/2007 Document Revised: 07/31/2018 Document Reviewed: 07/31/2018 Elsevier Patient Education  2020 [employer]

## 2019-05-20 NOTE — Progress Notes (Signed)
   CC: Left ankle swelling.  HPI:  [name] is a 48 y.o. with past medical history as listed below came to the clinic with complaint of left ankle swelling. Patient is experiencing intermittent left ankle swelling with mobilization.  Denies any pain, erythema or hyperthermia.  Her swelling improves by elevating her leg.  Her swelling completely disappears on waking up in the morning.  Swelling reoccurs pretty quickly while walking around or staying on her feet.  She was also experiencing mild right-sided chest discomfort, denies any pleuritic component.  Denies any shortness of breath.  Denies any other upper respiratory symptoms or new cough. Denies any fever or chills, no sick contact or recent travel.  Past Medical History:  Diagnosis Date  . Chest pain    2/2 related to PE with component of anxiety versus pericarditis  Improves with tylenol prn a few times a week EKG with long QTc and nonspecific t wave changes. 2d echo 08/23/2013 - normal  08/26/2013 referred to cards for evaluation Lipid check ordered.   Marland Kitchen DVT (deep venous thrombosis) ([place])    "LLE" (07/04/2013)  . Pulmonary embolism ([place])    Review of Systems: Negative except mentioned in HPI.  Physical Exam:  Vitals:   05/20/19 1500  BP: 135/78  Pulse: 79  Temp: 98.2 F (36.8 C)  TempSrc: Oral  SpO2: 100%  Weight: 263 lb 1.6 oz (119.3 kg)  Height: 5\' 11"  (1.803 m)   General: Vital signs reviewed.  Patient is well-developed and well-nourished, in no acute distress and cooperative with exam.  Head: Normocephalic and atraumatic. Eyes: EOMI, conjunctivae normal, no scleral icterus.  Cardiovascular: RRR, S1 normal, S2 normal, no murmurs, gallops, or rubs. Pulmonary/Chest: Clear to auscultation bilaterally, no wheezes, rales, or rhonchi. Abdominal: Soft, non-tender, non-distended, BS +, Extremities: Left ankle 2+ pitting edema, a small hyperpigmented area on lower leg, no calf tenderness.  No erythema or hyperthermia.   Pulses intact and symmetrical. Psychiatric: Normal mood and affect. speech and behavior is normal. Cognition and memory are normal.  Assessment & Plan:   See Encounters Tab for problem based charting.  Patient discussed with Dr. Angelia Mould.

## 2019-05-20 NOTE — Assessment & Plan Note (Addendum)
Her symptoms of left ankle swelling which improved with rest and increased with ambulation are more consistent with venous insufficiency. Patient is compliant with Eliquis.  -Give her compression stockings-advised to use stockings with ambulation during the day. -She was also advised to keep her legs elevated while sitting. -If she experience worsening of her swelling despite these measures are developed new symptoms and pain she might need reevaluation for DVT. -Once patient get insurance she might get benefit with referral to vein clinic.

## 2019-05-21 NOTE — Progress Notes (Signed)
Patient was seen today in a co-visit, see documentation under Dr. Latina Craver visit for details.

## 2019-05-21 NOTE — Progress Notes (Signed)
Internal Medicine Clinic Attending  Case discussed with Dr. Amin at the time of the visit.  We reviewed the resident's history and exam and pertinent patient test results.  I agree with the assessment, diagnosis, and plan of care documented in the resident's note.    

## 2019-06-18 ENCOUNTER — Ambulatory Visit: Payer: Self-pay | Admitting: Obstetrics & Gynecology

## 2019-06-26 ENCOUNTER — Other Ambulatory Visit: Payer: Self-pay

## 2019-06-26 ENCOUNTER — Encounter: Payer: Self-pay | Admitting: Internal Medicine

## 2019-06-26 ENCOUNTER — Ambulatory Visit: Payer: Self-pay | Admitting: Internal Medicine

## 2019-06-26 VITALS — BP 123/78 | HR 86 | Temp 97.9°F | Ht 71.0 in | Wt 275.4 lb

## 2019-06-26 DIAGNOSIS — I872 Venous insufficiency (chronic) (peripheral): Secondary | ICD-10-CM

## 2019-06-26 DIAGNOSIS — Z0000 Encounter for general adult medical examination without abnormal findings: Secondary | ICD-10-CM

## 2019-06-26 DIAGNOSIS — Z7901 Long term (current) use of anticoagulants: Secondary | ICD-10-CM

## 2019-06-26 DIAGNOSIS — M25472 Effusion, left ankle: Secondary | ICD-10-CM

## 2019-06-26 DIAGNOSIS — Z86718 Personal history of other venous thrombosis and embolism: Secondary | ICD-10-CM

## 2019-06-26 LAB — GLUCOSE, CAPILLARY: Glucose-Capillary: 90 mg/dL (ref 70–99)

## 2019-06-26 LAB — POCT GLYCOSYLATED HEMOGLOBIN (HGB A1C): Hemoglobin A1C: 5.6 % (ref 4.0–5.6)

## 2019-06-26 NOTE — Patient Instructions (Signed)
Visit Summary:  It was a pleasure to meet you today. Here is a summary of the topics we discussed today:  1. Venous Insufficiency - Continue using compression stockings throughout the day. Consider getting a bigger size or stockings with a zipper on the back. Keep feet elevated whenever possible. Please ask the vein clinic to fax over your visit summary. I have attached some information about venous insufficiency and some of the therapies for your information. We look look forward to seeing you again soon.   Best regards,  Dellia Cloud  Patient Education:  CHRONIC VENOUS DISEASE OVERVIEW-Chronic venous disease is a common disorder that affects the veins of the legs. These veins carry blood from the legs to the heart. Normal veins have a series of valves that open and close to direct blood flow from the surface of the legs to the deep leg veins, from which calf muscles pump blood back to the heart. The valves also control the pressure in smaller veins on the legs' surface. If the valves within the veins fail to work properly, there is a blockage to normal flow, or the calf muscles cannot pump properly, blood can flow backwards in the veins and pool in the legs. The pooled blood can increase pressure in the veins. This can cause problems that range in severity from mild (such as a feeling of leg heaviness, aching, or dilated or unsightly veins) to severe (such as swelling of the leg, ankles or feet, skin color changes, skin rash on the leg, recurrent skin infections, and chronic ulcers). People who develop these more severe symptoms are said to have chronic venous insufficiency. CHRONIC VENOUS DISEASE CAUSES-Any problem that increases pressure in the veins in the legs can widen the veins. This can damage the valves, which leads to even higher pressures and worsened vein function and can eventually lead to chronic venous disease.  The pressure inside the veins can increase for a number of reasons,  including: ?A clot inside a vein - A clot will block blood flow through the vein and cause pressure to build up. Often this causes permanent damage to the vein or valves, even after the clot has dissolved. ?Leg injury or surgery - Injury or surgery that blocks the flow of blood through a vein can increase pressure.  ?Excess weight or weight gain - The added weight of pregnancy or obesity can increase pressure in the veins of the legs and damage the veins and valves. ?Standing or sitting for too long - Standing or sitting for prolonged periods without walking can decrease the movement of blood from the legs toward the heart and lead to increased pressure in the veins and pooling of blood. This is because the muscles in the legs play an important role in the circulation of blood, acting as a pump to move blood from the legs back to the heart.   CHRONIC VENOUS DISEASE SYMPTOMS-Chronic venous disease can cause painless enlarged veins, skin irritation, skin rash, skin discoloration, itching, swelling, and skin ulcers. The legs may feel heavy, tired, or achy, usually at the end of the day or after prolonged standing. (See "Clinical manifestations of lower extremity chronic venous disease".)  Dilated veins-The most frequent feature of venous disease is widening (dilation) of the veins. Dilated veins may appear as thin blue flares, often called spider veins (picture 1), or much wider, twisted veins, called varicose veins, that bulge on the surface (picture 2).  Swelling-Long-standing chronic venous disease can cause swelling (edema) in the  ankles and lower legs. Sometimes this swelling is evident only at the end of the day; other times it is present all the time. Swelling almost always decreases with leg elevation, so it may be less prominent in the morning. The area just above the ankle bones is often the first place that swelling is seen. However, swelling can be caused by conditions other than chronic  venous disease, so this problem should be evaluated to determine the cause. (See "Patient education: Edema (swelling) (Beyond the Basics)".)  Skin changes-Pooling of blood and increased pressure in the veins over months to years can cause the skin to become tan or a reddish-brown color. Often, the skin changes are initially noticeable around the ankle, starting first on the inside of the ankle, but frequently occur over the shins and on the foot. Pooling of blood in the legs often causes the skin to become irritated and inflamed. This can cause redness, itching, dryness, oozing of fluid, scaling, open sores from scratching, and crusting or scabbing. Some people develop brown or red shiny areas that are hard and scar-like, and can be painful. This usually happens after many years of venous disease but can occur suddenly.  Venous ulcers-Open, nonhealing sores caused by chronic venous disease are called venous ulcers or venous stasis ulcers. The most common location for stasis ulcers is low on the inner ankle, but they can also occur on the outer ankle and on the shin area. Venous ulcers never occur above the knee and do not usually occur on the foot itself or the toes. Venous ulcers that occur above the ankle are often the result of an injury, or trauma such as from repeated scratching. More than one ulcer can occur at a time.  Venous ulcers often begin as small sores but can expand to become quite large. Venous ulcers are usually moderately uncomfortable, tender to touch, shallow, have a red appearance at the bottom, and may ooze or drain small to large amounts of fluid. Venous ulcers can take a long time (months or sometimes years) to heal. Healing is a gradual process, and the resulting scar is usually shiny pink or red, or white and shiny with red dots. Venous ulcers can come back even after they heal.  CHRONIC VENOUS DISEASE DIAGNOSIS-Doctors can diagnose chronic venous disease by examining a  person for signs and asking about symptoms of the disorder, such as the presence of varicose veins, swelling in the legs, skin changes, or skin ulcers. The doctor may do additional testing, such as an ultrasound, to look at vein valve function and to identify if the problem is located in the superficial veins or the deep veins. (See "Diagnostic evaluation of lower extremity chronic venous insufficiency".)  CHRONIC VENOUS DISEASE MANAGEMENT-Treatment of chronic venous disease is focused on reducing symptoms, such as swelling, treating skin problems, preventing and treating ulcers, and improving blood flow from the legs. (See "Medical management of lower extremity chronic venous disease".)  Leg elevation-Simply elevating the legs above heart level for 30 minutes three or four times per day can reduce swelling and improve blood flow in the veins. Improving blood flow can speed healing of venous ulcers. However, it may not be practical for some people to elevate their legs several times per day. To be effective, it is important to elevate the legs above the level of the heart; simply putting your legs up on a footstool does little to improve drainage of blood from the legs. Leg elevation alone may be the  only treatment needed for people with mild chronic venous disease, but additional treatments are usually needed in more severe cases.  Exercises-Foot and ankle exercises are often recommended to reduce symptoms. Pointing the feet down and up (movement from the ankle) several times throughout the day can help move blood from the legs back to the heart, as can repeatedly lifting the heels off the floor to stand on the toes, repeating several sets of this exercise daily. This may be especially helpful for people who sit or stand for long periods of time. Walking is a good exercise for the calf muscle pump.  Compression therapy-Most experts consider compression therapy to be an essential treatment for chronic  venous disease [1,2]. Compression stockings are recommended for most people with chronic venous disease. People with more severe symptoms, such as venous ulcers, often need treatment with compression bandages.  Compression stockings-Compression stockings gently compress the legs and may improve blood flow in the veins by preventing backward flow of blood. Effective compression stockings apply the greatest amount of pressure at the ankle and gradually decrease the pressure up the leg. These stockings are available with varying degrees of compression. ?Stockings with small amounts of compression can be purchased at pharmacies and surgical supply stores without a prescription. ?People with moderate-to-severe disease, those who stand for long periods of time, and those with venous ulcers usually require prescription stockings. A healthcare provider may take measurements for stockings, or may write a prescription for stockings that can be filled at a surgical supply or specialty store where trained staff take the necessary measurements. Stockings are available in several styles, including knee-high, thigh-high, and pantyhose with open or closed toes. Knee-high stockings are sufficient for most people. Some people experience skin irritation or pain, especially with initial use of compression stockings, which can be related to improper fit or highly inflamed skin. The following figures show tips for using compression stockings (table 1 and figure 1A-C).  Intermittent pneumatic compression pumps-Standard compression stockings may be less effective or difficult to use if you are very overweight or have a lot of swelling. An alternative approach is the use of intermittent pneumatic compression (IPC) pumps [3]. These devices consist of flexible plastic sleeves that encircle the lower leg. Air chambers lining these plastic sleeves periodically inflate, compress the leg, and then deflate. These are generally used  for several hours per day. Similar to compression stockings, IPC pumps may be painful for some people, particularly with initial use, but this improves as swelling is reduced with treatment.  Compression bandages-People with severe symptoms, like ulcers, may need to be treated with compression bandages. Compression bandages may consist of one or more layers of an elastic wrap and may look similar to a soft cast. These are applied by a nurse or doctor.  Topical medicines may be applied to the skin, and if ulcers are present, they may be covered with special dressings before compression bandages are put on. The bandages are usually changed once or twice a week and must stay dry. A cast bag or other plastic bag can be placed over the compression bandage to keep it dry while showering. If you have compression bandages and they get wet, or fluid from the wound leaks through the bandages, you should contact your doctor to have them changed.  Dressings-Ulcers may be covered with special dressings before putting on compression stockings or compression bandages. Dressings are important to help ulcers heal. They are used to absorb fluid oozing out of the wound,  reduce pain, control odor, remove dead or infected cells, and help new skin cells to grow. There are several types of dressing material used for venous ulcers. The type and frequency of dressings is determined by the size of the ulcer, amount of drainage, and other factors.  Medications-A variety of medications have been used for chronic venous disease and venous ulcers. ?Aspirin (300 to 325 mg/day) may speed the healing of ulcers. ?Antibiotics are only recommended when there is an infection. Topical antimicrobial agents are rarely needed and may cause allergic reactions that complicate rather than improve the condition. ?Horse chestnut seed extract reduces swelling and leg size in people with chronic venous disease. It may be recommended for people  who cannot tolerate compression therapy, usually at a dose of 300 mg twice daily. Horse chestnut seed extract is available as a dietary supplement and does not require a prescription. Because dietary supplements are not regulated like prescription medicines, there can be variations in the actual dose of each pill. Talk to your doctor if you want to try this. ?Hydroxyethylrutoside is a prescription medication available in [place] that can reduce leg volume, swelling, and other symptoms. ?Micronized purified flavonoid fraction is also available as a dietary supplement; when added to compression therapy, it may improve healing of ulcers and many symptoms of chronic venous insufficiency, including lower extremity edema. It is usually taken as two 500 mg tablets daily. Talk to your doctor if you want to try this. ?The skin irritation caused by chronic venous disease, called stasis dermatitis, often improves by using moisturizers. Sometimes, a steroid cream or ointment is needed to help with itching and inflammation.  Other creams and ointments, anti-itch products, and scented lotions should be avoided because there is a risk of developing an allergic rash (contact dermatitis) from these products.  Treatment of contact dermatitis-Contact dermatitis is an allergic skin reaction that occurs when an irritating or allergy-producing substance touches the skin. The reaction can occur on the legs or other areas of the body. Contact dermatitis is common in people with chronic venous disease. Treatment of contact dermatitis is discussed separately.  (See "Patient education: Contact dermatitis (including latex dermatitis) (Beyond the Basics)".)  VEIN ABLATION TREATMENTS-Vein ablation treatments are treatments designed to destroy superficial veins that have abnormal valve function. These treatments are usually reserved for people with symptoms who do not respond to the simpler treatments described above. (See 'Leg  elevation' above and 'Exercises' above and 'Compression stockings' above and 'Dressings' above.)  Veins are destroyed in one of three ways: Liquid, foam, or glue sclerotherapy-For these procedures, which are referred to as nonthermal ablation, the doctor injects a chemical or glue into the diseased vein that causes it to collapse on itself. The vein stays in place, but it no longer carries blood. Sclerotherapy or glue can be done in a doctor's office with local anesthesia. (See "Liquid, foam, and glue sclerotherapy techniques for the treatment of lower extremity veins".) Radiofrequency or laser ablation-For these procedures, which are also called thermal ablation procedures, the doctor inserts a special catheter into the diseased vein. This wire heats up the vein and seals it from the inside (figure 2). The vein stays in place, but it no longer carries blood. These procedures involve no surgery and can be done with very little anesthesia. They can often be done in a doctor's office. (See "Clinical manifestations of lower extremity chronic venous disease".)  Vein ligation or stripping-These procedures involve surgery to remove the diseased vein or veins. People  who have these procedures must be treated in a hospital or surgery center. Veins are removed through small incisions. (See "Open surgical techniques for lower extremity vein ablation".)  SUMMARY ?Chronic venous disease is a problem that affects the veins of the legs. Normally, the leg veins carry blood back to the heart. In people with chronic venous disease, the veins do not work well. This can cause blood to collect in the lower legs and feet. ?People with chronic venous disease often report that their legs feel heavy, tired, or achy. These problems are more common at the end of the day or after standing for long periods. The feet and ankles may also become swollen. ?People who have chronic venous disease can develop problems such as skin  infections, skin color changes, rashes, or sores that do not heal. These sores, called ulcers, can be difficult to treat and sometimes take months or years to heal, especially without proper evaluation and treatment. ?The goal of treatment is to improve symptoms, reduce swelling, and prevent skin infections and ulcers. ?Treatments for swelling include propping up the legs when possible, wearing stockings that gently compress the ankles and lower legs, performing foot and ankle exercises, and walking. ?Treatments for skin ulcers include special coverings for the area and antibiotics if there is an infection. Some people need compression bandages to help ulcers heal. Leg ulcers may be caused by problems other than chronic venous disease, so evaluation by a vascular or wound specialist is an important step before beginning treatment to ensure that the proper diagnosis has been made. ?Antibiotic ointments or salves that are rubbed on the skin, anti-itch creams, and scented lotions are not recommended because these products can cause an allergic skin reaction. ?Vein ablation treatments (nonthermal or thermal) are an option for people who have symptoms that do not respond to other treatments.

## 2019-06-27 ENCOUNTER — Encounter: Payer: Self-pay | Admitting: Internal Medicine

## 2019-06-27 LAB — BMP8+ANION GAP
Anion Gap: 15 mmol/L (ref 10.0–18.0)
BUN/Creatinine Ratio: 16 (ref 9–23)
BUN: 10 mg/dL (ref 6–24)
CO2: 22 mmol/L (ref 20–29)
Calcium: 9.4 mg/dL (ref 8.7–10.2)
Chloride: 101 mmol/L (ref 96–106)
Creatinine, Ser: 0.64 mg/dL (ref 0.57–1.00)
GFR calc Af Amer: 124 mL/min/{1.73_m2} (ref >59)
GFR calc non Af Amer: 107 mL/min/{1.73_m2} (ref >59)
Glucose: 83 mg/dL (ref 65–99)
Potassium: 4.4 mmol/L (ref 3.5–5.2)
Sodium: 138 mmol/L (ref 134–144)

## 2019-06-27 NOTE — Progress Notes (Signed)
   CC: Ankle Swelling  HPI:  Kara Mcclain is a 48 y.o. female with a pertinent past medical history noted below and presents today due to worsening of ankle swelling. Kara Mcclain was recently seen on 06/29 for the same issue and states that the swelling has progressed down her left ankle to her foot. She purchased new compression stockinafter her last visit, but recently had to cut them off of her leg because the swelling was casuing her leg to be too constricted.  She saw an advertisement for [employer] and set up for a free [place] and consultation on August 10th. She admits to mild pain on the medial aspect of her distal shin. She is concerned that she is having another DVT.  Past Medical History:  Diagnosis Date  . Chest pain    2/2 related to PE with component of anxiety versus pericarditis  Improves with tylenol prn a few times a week EKG with long QTc and nonspecific t wave changes. 2d echo 08/23/2013 - normal  08/26/2013 referred to cards for evaluation Lipid check ordered.   Marland Kitchen DVT (deep venous thrombosis) ([place])    "LLE" (07/04/2013)  . Pulmonary embolism ([place])    Review of Systems:  Review of Systems  Constitutional: Negative for chills, diaphoresis and fever.  Respiratory: Negative for cough, hemoptysis and shortness of breath.   Cardiovascular: Positive for leg swelling. Negative for chest pain.     Physical Exam:  Vitals:   06/26/19 1338  BP: 123/78  Pulse: 86  Temp: 97.9 F (36.6 C)  TempSrc: Oral  SpO2: 100%  Weight: 275 lb 6.4 oz (124.9 kg)  Height: 5\' 11"  (1.803 m)   Lifestyle  Physical activity  . Days per week: Not on file  . Minutes per session: Not on file   Physical Exam  Constitutional: She is oriented to person, place, and time and well-developed, well-nourished, and in no distress. No distress.  Cardiovascular: Normal rate, regular rhythm, normal heart sounds and intact distal pulses. Exam reveals no gallop and no friction rub.  No murmur  heard. Pulmonary/Chest: Effort normal and breath sounds normal. No respiratory distress. She exhibits no tenderness.  Musculoskeletal:        General: Tenderness (mild tenderness ofe medial aspect of the left shin) and edema (3+ pitting edema) present.  Neurological: She is alert and oriented to person, place, and time.  Skin: Skin is warm and dry. She is not diaphoretic. No erythema.     Assessment & Plan:   See Encounters Tab for problem based charting.  Patient seen with Dr. Lynnae January

## 2019-06-27 NOTE — Assessment & Plan Note (Addendum)
Assessment: - Venous insufficiency and currently being anticoagulated with Eliquis.  Plan: - Check BMP to make sure kidney function is still within normal limits - Asked the patient to follow up with [place] after her appointment with the [facility].

## 2019-06-28 NOTE — Progress Notes (Signed)
Internal Medicine Clinic Attending  I saw and evaluated the patient.  I personally confirmed the key portions of the history and exam documented by Dr. Marianna Payment and I reviewed pertinent patient test results.  The assessment, diagnosis, and plan were formulated together and I agree with the documentation in the resident's note.    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  I do not think she has a component of acute DVT and so an emergent Doppler is not indicated.  I agree with referral to vascular to see if there is any surgical intervention that can assist.  Review of the literature indicates that horse Chestnut seed extract has RCT evidence to improve edema especially when compression cannot be tolerated.  This will be a useful therapy to recommend to her.  Many of the other therapies show improvement and also healing but she is not yet at the ulcer stage.

## 2019-07-03 ENCOUNTER — Ambulatory Visit: Payer: Self-pay | Admitting: Obstetrics & Gynecology

## 2019-08-30 ENCOUNTER — Telehealth: Payer: Self-pay | Admitting: Internal Medicine

## 2019-08-30 NOTE — Telephone Encounter (Addendum)
Returned call to patient. States she still has not received Eliquis from the patient assistance program and requests a sample. Patient took last dose this AM.   Medication Sample handed to the patient by Dr. Charleen Kirks.  Drug name: Eliquis       Strength: 5 mg        Qty: 60 tabs  LOT: [id number]  Exp.Date: 08/2021  L. [place], BSN, RN-BC

## 2019-08-30 NOTE — Telephone Encounter (Signed)
Pt needs some samples apixaban (ELIQUIS) 5 MG TABS tablet ; pt contact [phone]

## 2019-09-10 NOTE — Telephone Encounter (Signed)
Thank you! Patient was enrolled in free Eliquis program since July, but patient did not answer the phone when they called to deliver the medication.  I called patient to notify her to call [phone] option 6 so she can order a shipment of Eliquis. Patient verbalized understanding.

## 2019-12-11 ENCOUNTER — Encounter: Payer: Self-pay | Admitting: Internal Medicine

## 2019-12-11 ENCOUNTER — Ambulatory Visit: Payer: Self-pay

## 2019-12-25 ENCOUNTER — Telehealth: Payer: Self-pay | Admitting: *Deleted

## 2019-12-25 NOTE — Telephone Encounter (Addendum)
Patient called in requesting sample of Eliquis 5 mg. States she was unaware that she needed to contact manufacturer to order shipment.  Medication Sample handed to the patient by Dr. Rogelia Boga  Drug name: Eliquis       Strength: 5 mg        Qty: 42 tabs                  LOT: [id number]            Exp.Date: 11/2021  Patient made aware to contact [employer] at [phone] option 6 per Dr. Elmyra Ricks note started 08/30/2019 to receive shipments going forward. Also, made aware to r/s missed PCP appt. Kinnie Feil, BSN, RN-BC

## 2020-02-05 ENCOUNTER — Telehealth: Payer: Self-pay | Admitting: Internal Medicine

## 2020-02-05 DIAGNOSIS — I82409 Acute embolism and thrombosis of unspecified deep veins of unspecified lower extremity: Secondary | ICD-10-CM

## 2020-02-05 NOTE — Telephone Encounter (Signed)
Pt is requesting more sample of apixaban (ELIQUIS) 5 MG TABS tablet  ;pt contact [phone]

## 2020-02-05 NOTE — Telephone Encounter (Signed)
RTC to patient, she states she has called patient assistance program several times and is being told she is approved.  Pt states she is also being told her medication will ship out soon, but she never receives her medicine (eliquis 5mg ).   Eliquis 5mg  samples , 3 boxes pulled (#42) for patient to pick up. Will refer to RN, CCM to assist patient with obtaining meds from patient assistance program. [place], RN,BSN

## 2020-02-06 ENCOUNTER — Ambulatory Visit: Payer: Self-pay | Admitting: *Deleted

## 2020-02-06 DIAGNOSIS — I82409 Acute embolism and thrombosis of unspecified deep veins of unspecified lower extremity: Secondary | ICD-10-CM

## 2020-02-06 DIAGNOSIS — I2699 Other pulmonary embolism without acute cor pulmonale: Secondary | ICD-10-CM

## 2020-02-06 NOTE — Chronic Care Management (AMB) (Signed)
  Care Management   Note  02/06/2020 Name: Kara Mcclain MRN: [id number] DOB: October 31, 1971  Reached patient and obtained verbal permission to call [name] to assist with getting Eliquis via the patient assistance program.. Patient will call this RNCM with the number for the patient assistance program.  The care management team will reach out to the patient again over the next 2 days.    Cranford Mon RN, CCM, [facility]ES CCM Clinic RN Care Manager [phone]

## 2020-02-07 ENCOUNTER — Ambulatory Visit: Payer: Self-pay | Admitting: *Deleted

## 2020-02-07 DIAGNOSIS — I82409 Acute embolism and thrombosis of unspecified deep veins of unspecified lower extremity: Secondary | ICD-10-CM

## 2020-02-07 DIAGNOSIS — I2699 Other pulmonary embolism without acute cor pulmonale: Secondary | ICD-10-CM

## 2020-02-07 NOTE — Chronic Care Management (AMB) (Signed)
  Care Management   Note  02/07/2020 Name: Kara Mcclain MRN: [id number] DOB: 14-Dec-1970  Patient did not call back with phone number for Eliquis patient assistance program so per Dr. Elmyra Ricks note of 08/30/19, call placed to [facility] at [phone], spoke with [facility]. He verified that patient has been approved for Eliquis patient assistance program and is eligible until 06/10/20. Ross Marcus will contact [employer] to determine reason for nondelivery of Eliquis to patient. Ross Marcus was given patient's correct contact number.    Breenan to call this RNCM wilh results of investigation.  Cranford Mon RN, CCM, CDCES CCM Clinic RN Care Manager [phone]

## 2020-02-07 NOTE — Chronic Care Management (AMB) (Signed)
  Care Management   Note  02/07/2020 Name: Kara Mcclain MRN: [id number] DOB: [birth year]  Called patient to advise her [employer] is trying to reach her by phone to arrange initial delivery of Eliquis via mail. Patient states she just missed a call from the [facility] number. Emphasized to patient that it is necessary they speak to her personally to arrange mail order delivery of Eliquis. Patient verified the correct number for [employer], [phone] and states she will call them back immediately.    Cranford Mon RN, CCM, CDCES CCM Clinic RN Care Manager [phone]

## 2020-02-10 ENCOUNTER — Ambulatory Visit: Payer: Self-pay | Admitting: *Deleted

## 2020-02-10 NOTE — Chronic Care Management (AMB) (Signed)
  Care Management   Outreach Note  02/10/2020 Name: Kara Mcclain MRN: [id number] DOB: 08-Jan-1971  Referred by: Kirt Boys, MD Reason for referral : Care Coordination (DVT,PE- need for lifelong anticoagulation)  Reached Patient via mobile number to follow up on status of Eliquis patient assistance program. [name] states she spoke to a representative of the program on 02/07/20, completed another application via phone and ensued they had her correct contact information and that she has theirs. She states she was told she will receive her first shipment of Eliquis in the next 10 business days.  Follow Up Plan:  The patient was asked to contact this RNCM when she receives the first shipment. Patient agreed and voiced appreciation to this RNCM for assistance in getting the Eliquis.  Cranford Mon RN, CCM, CDCES CCM Clinic RN Care Manager [phone]

## 2020-02-12 ENCOUNTER — Ambulatory Visit: Payer: Self-pay | Admitting: *Deleted

## 2020-02-12 DIAGNOSIS — I2699 Other pulmonary embolism without acute cor pulmonale: Secondary | ICD-10-CM

## 2020-02-12 DIAGNOSIS — I82409 Acute embolism and thrombosis of unspecified deep veins of unspecified lower extremity: Secondary | ICD-10-CM

## 2020-02-12 NOTE — Patient Instructions (Signed)
Visit Information It was nice speaking with you today and I'm happy to hear you received your Eliquis by mail and know how to notify [employer] for refills. Please contact me should you need assistance in the future.   Goals Addressed            This Visit's Progress     Patient Stated   . COMPLETED: "I can't get my Eliquis" (pt-stated)       CARE PLAN ENTRY (see longtitudinal plan of care for additional care plan information)  Current Barriers:  . Difficulty obtaining medications- patient called this RNCM and stated she received her first shipment of Eliquis and states she is aware of how to call when refills needed. Patient also expressed appreciation for this RNCM's assistance  Nurse Case Manager Clinical Goal(s):  Marland Kitchen Over the next 30 days, patient will work with [facility]  to address needs related to securing Eliquis from [facility].   Interventions:  . Collaborated with [employer] regarding securing Eliquis for patient.  Patient Self Care Activities:  . Patient verbalizes understanding of plan to secure Eliquis from [employer]. . Self administers medications as prescribed . Attends all scheduled provider appointments . Calls [employer] for medication refills . Calls provider office for new concerns or questions . Unable to independently secure ongoing supply of Eliquis  Please see past updates related to this goal by clicking on the "Past Updates" button in the selected goal         The patient verbalized understanding of instructions provided today and declined a print copy of patient instruction materials.   No further follow up required: Medication assistance goal has been met.  Kelli Churn RN, CCM, [facility] RN Care Manager [phone]

## 2020-02-12 NOTE — Chronic Care Management (AMB) (Signed)
   Care Management   Follow Up Note   02/12/2020 Name: Kara Mcclain MRN: [id number] DOB: 25-Jun-1971  Referred by: Kirt Boys, MD Reason for referral : Care Coordination (PE, DVT)   Kara Mcclain is a 49 y.o. year old female who is a primary care patient of Kirt Boys, MD. The CCM team was consulted for assistance with chronic disease management and care coordination needs.    Review of patient status, including review of consultants reports, relevant laboratory and other test results, and collaboration with appropriate care team members and the patient's provider was performed as part of comprehensive patient evaluation and provision of chronic care management services.    SDOH (Social Determinants of Health) assessments performed: No See Care Plan activities for detailed interventions related to [facility])     Outpatient Encounter Medications as of 02/12/2020  Medication Sig  . acetaminophen (TYLENOL) 325 MG tablet Take 650 mg by mouth every 6 (six) hours as needed for mild pain or headache.   Marland Kitchen apixaban (ELIQUIS) 5 MG TABS tablet Take 1 tablet (5 mg total) by mouth 2 (two) times daily.   No facility-administered encounter medications on file as of 02/12/2020.     Objective:   Goals Addressed            This Visit's Progress     Patient Stated   . COMPLETED: "I can't get my Eliquis" (pt-stated)       CARE PLAN ENTRY (see longtitudinal plan of care for additional care plan information)  Current Barriers:  . Difficulty obtaining medications- patient called this RNCM and stated she received her first shipment of Eliquis and states she is aware of how to call when refills needed. Patient also expressed appreciation for this RNCM's assistance  Nurse Case Manager Clinical Goal(s):  Marland Kitchen Over the next 30 days, patient will work with [facility]  to address needs related to securing Eliquis from [facility] Patient [facility].   Interventions:  . Collaborated with  [employer] regarding securing Eliquis for patient.  Patient Self Care Activities:  . Patient verbalizes understanding of plan to secure Eliquis from [employer]. . Self administers medications as prescribed . Attends all scheduled provider appointments . Calls [employer] for medication refills . Calls provider office for new concerns or questions . Unable to independently secure ongoing supply of Eliquis  Please see past updates related to this goal by clicking on the "Past Updates" button in the selected goal          Plan:   No further follow up required: Patient has CCM RNCM's contact information if help is needed in the future   Cranford Mon RN, CCM, CDCES CCM Clinic RN Care Manager [phone]

## 2020-02-13 NOTE — Progress Notes (Signed)
Internal Medicine Clinic Resident   I have personally reviewed this encounter including the documentation in this note and/or discussed this patient with the care management provider. I will address any urgent items identified by the care management provider and will communicate my actions to the patient's PCP. I have reviewed the patient's CCM visit with my supervising attending.  Mikesha Migliaccio, MD 02/13/2020    

## 2020-02-20 ENCOUNTER — Telehealth: Payer: Self-pay

## 2020-03-05 ENCOUNTER — Ambulatory Visit: Payer: Self-pay

## 2020-03-07 ENCOUNTER — Ambulatory Visit: Payer: Self-pay | Attending: Internal Medicine

## 2020-03-07 DIAGNOSIS — Z23 Encounter for immunization: Secondary | ICD-10-CM

## 2020-03-07 NOTE — Progress Notes (Signed)
   Covid-19 Vaccination Clinic  Name:  SYESHA THAW    MRN: [id number] DOB: 10-20-1971  03/07/2020  Ms. Lindell was observed post Covid-19 immunization for 15 minutes without incident. She was provided with Vaccine Information Sheet and instruction to access the V-Safe system.   Ms. Lichtman was instructed to call 911 with any severe reactions post vaccine: Marland Kitchen Difficulty breathing  . Swelling of face and throat  . A fast heartbeat  . A bad rash all over body  . Dizziness and weakness   Immunizations Administered    Name Date Dose VIS Date Route   Pfizer COVID-19 Vaccine 03/07/2020 12:32 PM 0.3 mL 11/01/2019 Intramuscular   Manufacturer: [employer], [employer]   Lot: [id number]   NDC: [address]-6

## 2020-03-30 ENCOUNTER — Encounter: Payer: Self-pay | Admitting: *Deleted

## 2020-04-01 ENCOUNTER — Ambulatory Visit: Payer: Self-pay | Attending: Internal Medicine

## 2020-04-01 DIAGNOSIS — Z23 Encounter for immunization: Secondary | ICD-10-CM

## 2020-04-01 NOTE — Progress Notes (Signed)
   Covid-19 Vaccination Clinic  Name:  Kara Mcclain    MRN: [id number] DOB: [birth year]  04/01/2020  [name] was observed post Covid-19 immunization for 15 minutes without incident. She was provided with Vaccine Information Sheet and instruction to access the V-Safe system.   [name] was instructed to call 911 with any severe reactions post vaccine: Marland Kitchen Difficulty breathing  . Swelling of face and throat  . A fast heartbeat  . A bad rash all over body  . Dizziness and weakness   Immunizations Administered    Name Date Dose VIS Date Route   Pfizer COVID-19 Vaccine 04/01/2020 12:36 PM 0.3 mL 01/15/2019 Intramuscular   Manufacturer: [employer], [employer]   Lot: [id number]   NDC: [address]-1

## 2020-05-10 IMAGING — CT CT ANGIO CHEST
2 of 7 series · 19 of 46 positions shown · IV contrast (iopamidol)
Comparison: Radiographs of same day.  CT scan November 10, 2014.

CLINICAL DATA: Chest pain.

EXAM:
CT ANGIOGRAPHY CHEST WITH CONTRAST
TECHNIQUE: Multidetector CT imaging of the chest was performed using the
standard protocol during bolus administration of intravenous
contrast. Multiplanar CT image reconstructions and MIPs were
obtained to evaluate the vascular anatomy.
CONTRAST:  65 mL XLTQNZ-OOJ IOPAMIDOL (XLTQNZ-OOJ) INJECTION 76%

[Series 7: thins · axial · 0.67mm/px · z∈[+1268,+1492]mm · 16 of 363 slices shown]
[im 21/363  lung]
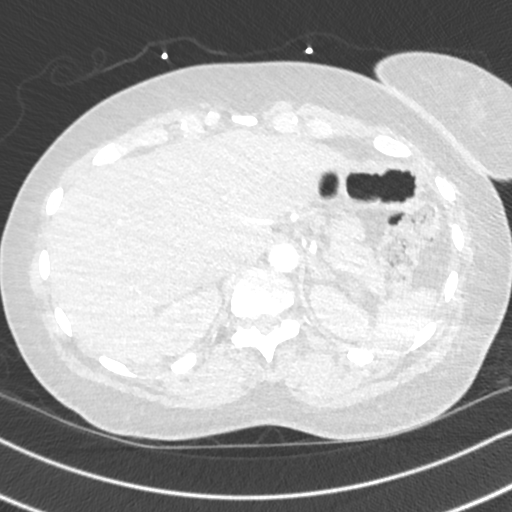
[im 41/363  soft-tissue]
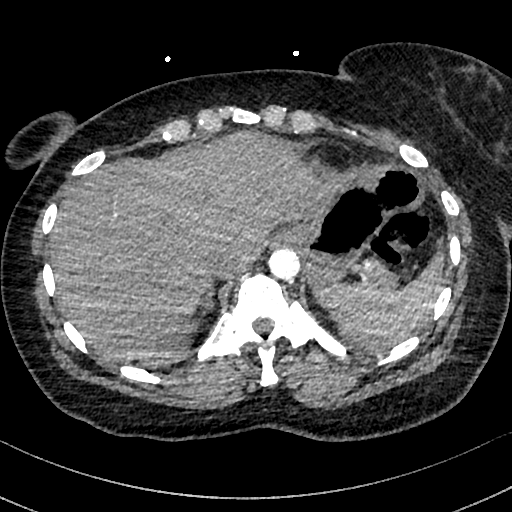
[im 61/363  lung]
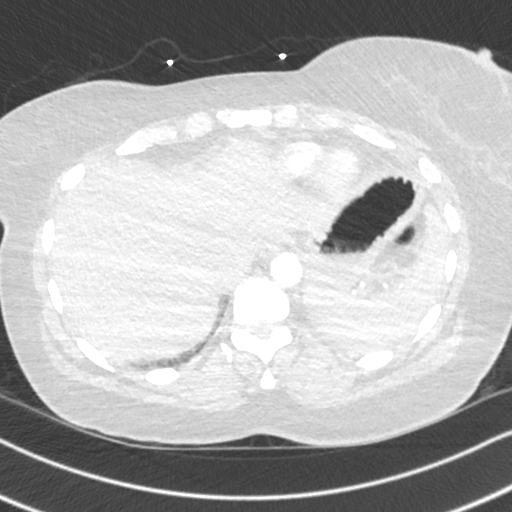
[im 81/363  soft-tissue]
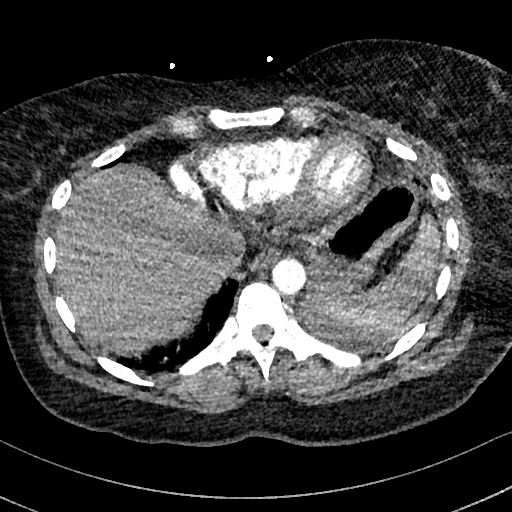
[im 101/363  lung]
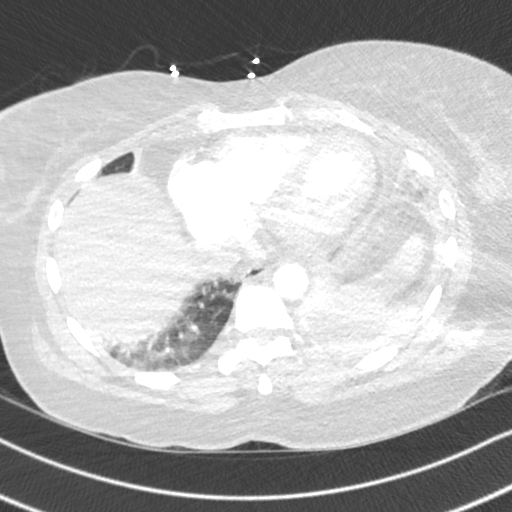
[im 121/363  soft-tissue]
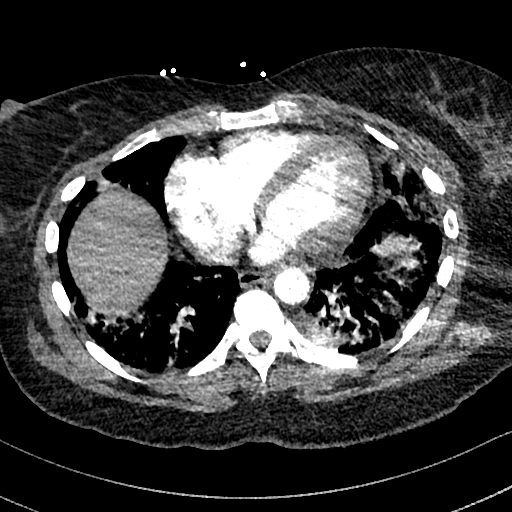
[im 141/363  lung]
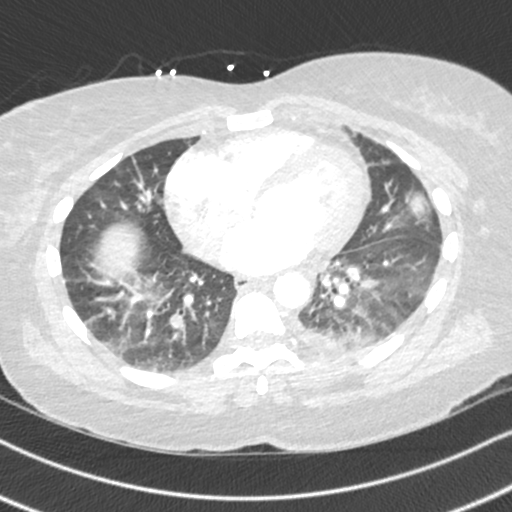
[im 161/363  soft-tissue]
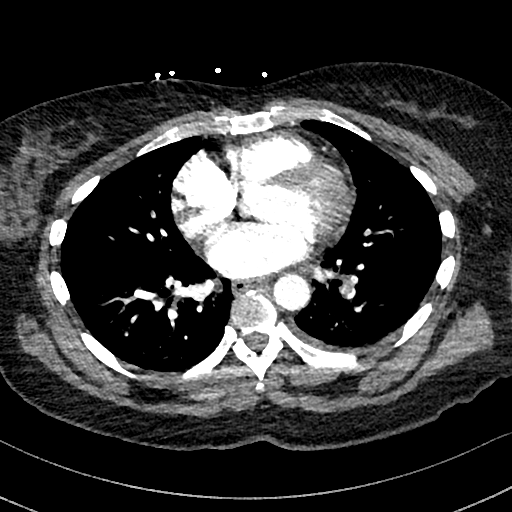
[im 202/363  lung]
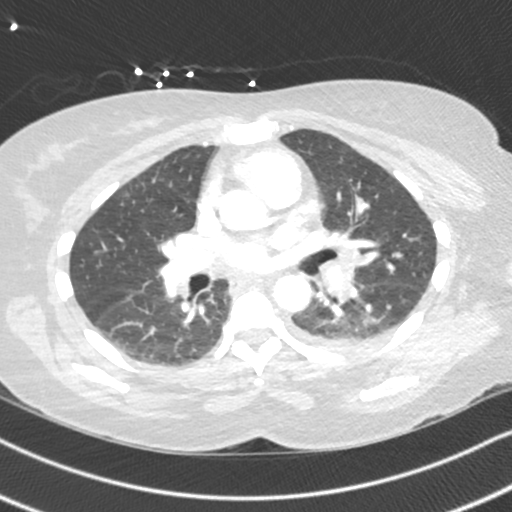
[im 222/363  soft-tissue]
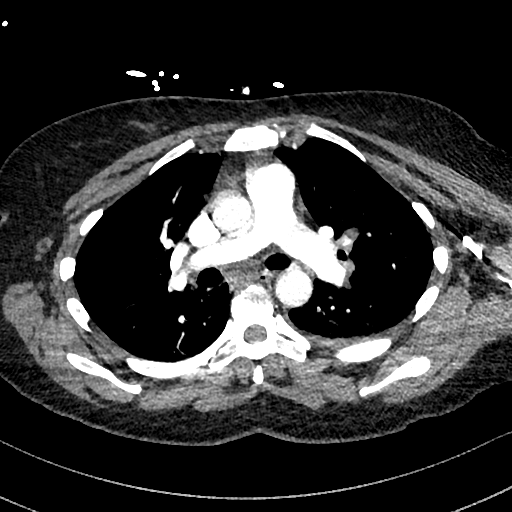
[im 242/363  lung]
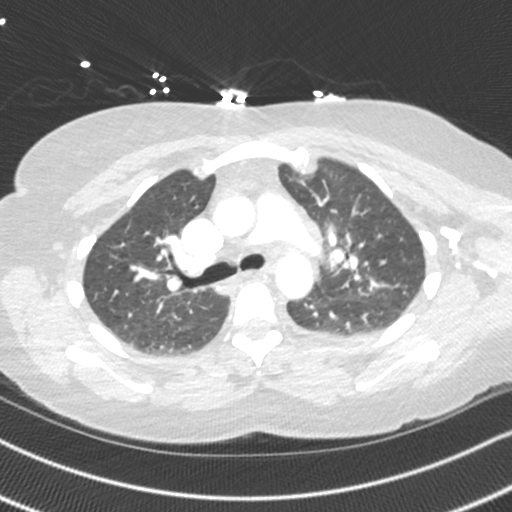
[im 262/363  soft-tissue]
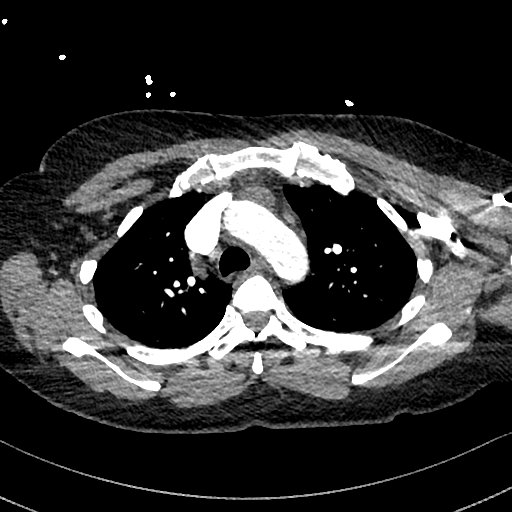
[im 282/363  lung]
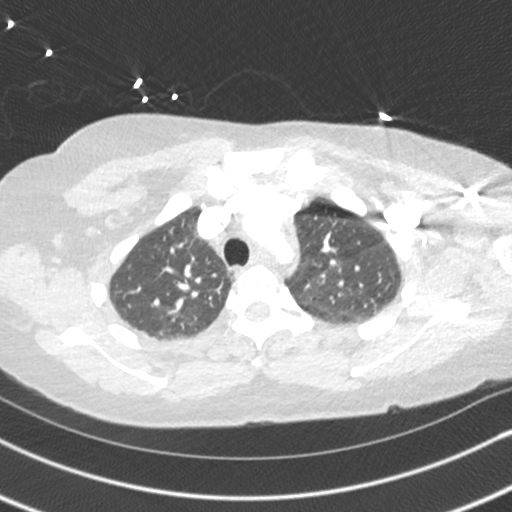
[im 302/363  soft-tissue]
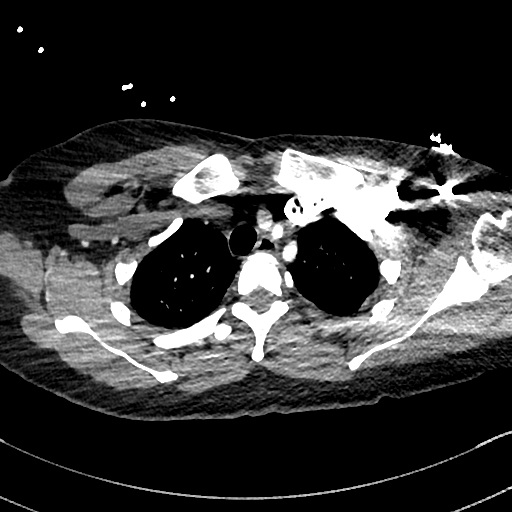
[im 322/363  lung]
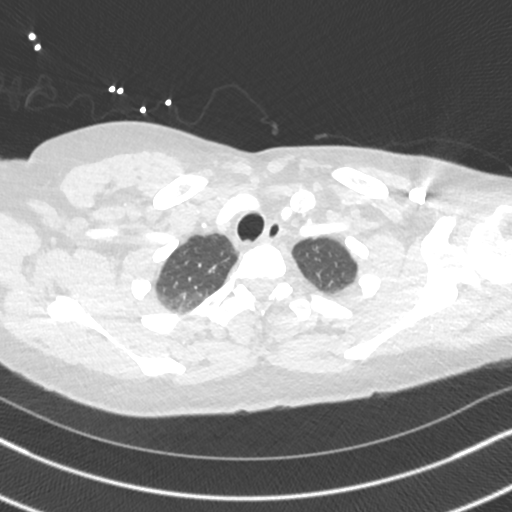
[im 342/363  soft-tissue]
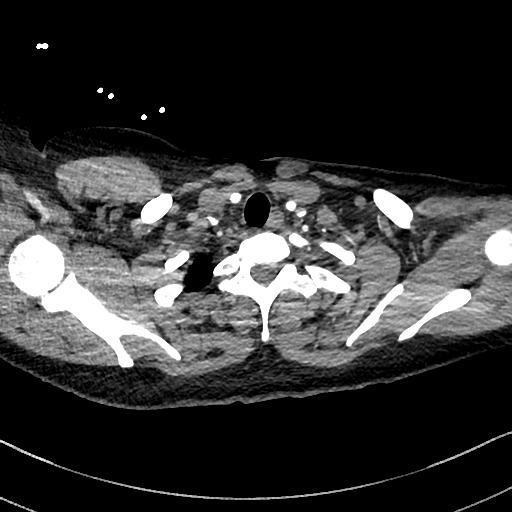

[Series 8: cor · coronal · 0.59mm/px · 3 of 151 slices shown]
[im 38/151  soft-tissue]
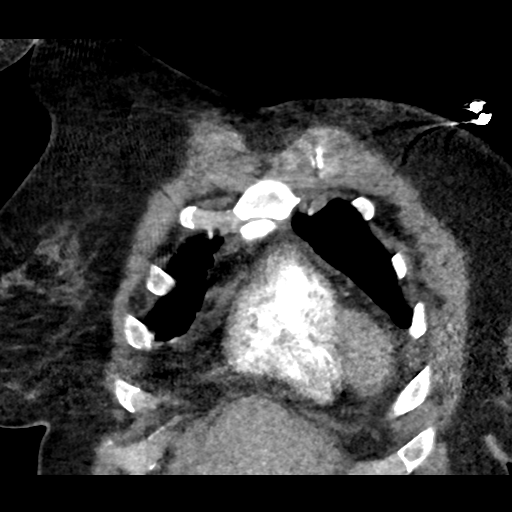
[im 76/151  soft-tissue]
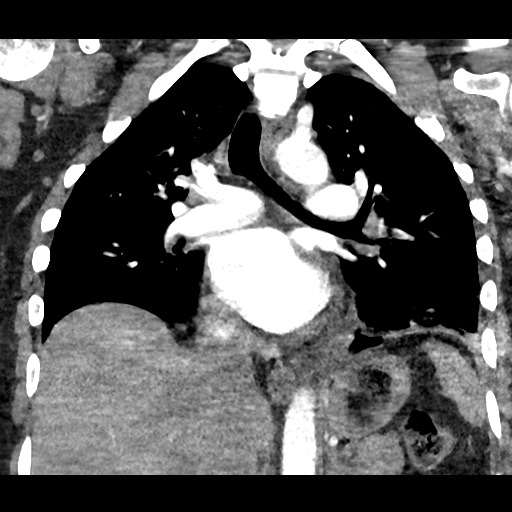
[im 113/151  soft-tissue]
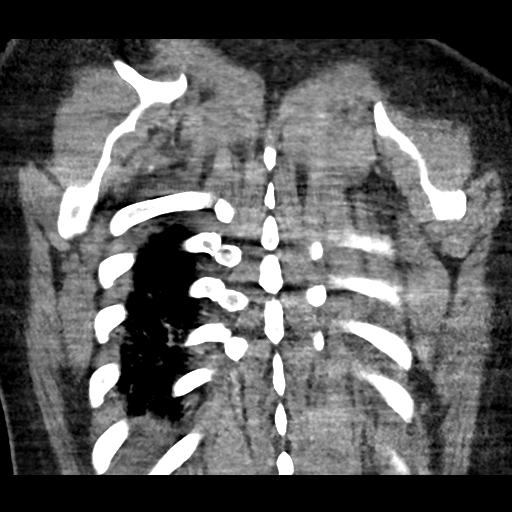

[19 of 46 positions shown; findings below may reference images not displayed]

FINDINGS: Cardiovascular: There is no evidence of thoracic aortic dissection
or aneurysm. Large filling defect is seen in lower lobe branches of
left pulmonary artery. Smaller filling defect is seen in right lower
lobe branch. RV/LV ratio of 1.05 is noted suggesting right heart
strain.

Mediastinum/Nodes: No enlarged mediastinal, hilar, or axillary lymph
nodes. Thyroid gland, trachea, and esophagus demonstrate no
significant findings.

Lungs/Pleura: No pneumothorax or pleural effusion is noted. Left
basilar subsegmental atelectasis is noted.

Upper Abdomen: No acute abnormality.

Musculoskeletal: No chest wall abnormality. No acute or significant
osseous findings.

Review of the MIP images confirms the above findings.
IMPRESSION: Large filling defect seen in lower lobe branches of left pulmonary
artery with smaller defect seen in lower lobe branches of right
pulmonary artery. Positive for acute PE with CT evidence of right
heart strain (RV/LV Ratio = 1.05) consistent with at least
submassive (intermediate risk) PE. The presence of right heart
strain has been associated with an increased risk of morbidity and
mortality. Please activate Code PE by paging [phone]. Critical
Value/emergent results were called by telephone at the time of
interpretation on 06/15/2018 at [DATE] to Dr. KIMARLIE ELVY , who
verbally acknowledged these results.

## 2020-07-22 ENCOUNTER — Encounter: Payer: Self-pay | Admitting: Student

## 2020-12-01 ENCOUNTER — Encounter: Payer: Self-pay | Admitting: Internal Medicine

## 2020-12-01 ENCOUNTER — Other Ambulatory Visit: Payer: Self-pay

## 2020-12-01 ENCOUNTER — Ambulatory Visit: Payer: Self-pay | Admitting: Internal Medicine

## 2020-12-01 VITALS — BP 127/74 | HR 65 | Temp 98.3°F | Ht 71.0 in | Wt 258.1 lb

## 2020-12-01 DIAGNOSIS — R002 Palpitations: Secondary | ICD-10-CM

## 2020-12-01 DIAGNOSIS — I82409 Acute embolism and thrombosis of unspecified deep veins of unspecified lower extremity: Secondary | ICD-10-CM

## 2020-12-01 DIAGNOSIS — Z13220 Encounter for screening for lipoid disorders: Secondary | ICD-10-CM

## 2020-12-01 DIAGNOSIS — Z1231 Encounter for screening mammogram for malignant neoplasm of breast: Secondary | ICD-10-CM

## 2020-12-01 DIAGNOSIS — Z124 Encounter for screening for malignant neoplasm of cervix: Secondary | ICD-10-CM

## 2020-12-01 DIAGNOSIS — Z0000 Encounter for general adult medical examination without abnormal findings: Secondary | ICD-10-CM

## 2020-12-01 DIAGNOSIS — N920 Excessive and frequent menstruation with regular cycle: Secondary | ICD-10-CM

## 2020-12-01 NOTE — Patient Instructions (Addendum)
Thank you for allowing [place] to provide your care today. Today we discussed your heavy menstrual bleeding and history of DVT.    I have ordered the following labs for you:  Complete blood count and lipid panel    I will call if any are abnormal.    Today we made the following changes to your medications:   I have placed a referral to the [facility] for mammogram and pap smear.   Please follow-up in one year.  Please call the internal medicine center clinic if you have any questions or concerns, we may be able to help and keep you from a long and expensive emergency room wait. Our clinic and after hours phone number is [phone], the best time to call is Monday through Friday 9 am to 4 pm but there is always someone available 24/7 if you have an emergency. If you need medication refills please notify your pharmacy one week in advance and they will send [place] a request.

## 2020-12-01 NOTE — Assessment & Plan Note (Signed)
She continues to have menorrhagia. First two days of her menstrual cycle she usually goes through 10 heavy pads each day, then has normal bleeding for three days. She denies weakness, dizziness, shortness of breath, chest pain, numbness or tingling, or craving ice.   - check CBC  - will check ferritin if anemic - referral to [facility] as she obtains her pap smears with them and is due

## 2020-12-01 NOTE — Assessment & Plan Note (Signed)
She will occasionally feel a flutter in her chest that will just lasts a second or so. This happens maybe once a week. She has had no chest pain, dizziness, shortness of breath, nausea, numbness or tingling in her hands or feet. She does endorse drinking a lot of caffeine.  NSR on exam, without m/r/g.   - discussed that these are likely premature atrial complexes secondary to caffeine use. Recommended decreasing caffeine intake and to f/u if symptoms continue or increase

## 2020-12-01 NOTE — Assessment & Plan Note (Signed)
-   referral for screening mammogram  - will plan for fit test at follow-up as she needs to save up the $90 - planning to get covid booster  - check lipid panel today

## 2020-12-01 NOTE — Progress Notes (Signed)
   CC: menorrhagia    HPI:  [name] is a 50 y.o. with PMH as below.   Please see A&P for assessment of the patient's acute and chronic medical conditions.   Postthrombotic syndrome   She is now following with [facility] for postthrombotic syndrome. She tries to wear her compression stockings daily but does not always do this, usually around 4-5 days per week. They do continue to help when she wears them. No SOB, chest pain, increased lower extremity swelling from baseline.  She is on xarelto 5mg  bid. She has had increased menorrhagia recently.  She continues to have menorrhagia. First two days of her menstrual cycle she usually goes through 10 heavy pads each day, then has normal bleeding for three days. She denies weakness, dizziness, shortness of breath, chest pain, numbness or tingling, or craving ice.   She will occasionally feel a flutter in her chest that will just lasts a second or so. This happens maybe once a week. She has had no chest pain, dizziness, shortness of breath, nausea, numbness or tingling in her hands or feet. She does endorse drinking a lot of caffeine.  NSR on exam, without m/r/g.   Past Medical History:  Diagnosis Date  . Chest pain    2/2 related to PE with component of anxiety versus pericarditis  Improves with tylenol prn a few times a week EKG with long QTc and nonspecific t wave changes. 2d echo 08/23/2013 - normal  08/26/2013 referred to cards for evaluation Lipid check ordered.   10/26/2013 DVT (deep venous thrombosis) (HCC)    "LLE" (07/04/2013)  . Pulmonary embolism (HCC)    Review of Systems:   10 point ROS negative except as noted in HPI  Physical Exam: Constitution: NAD, appears stated age HENT: [place]/AT Eyes: no icterus or injection  Cardio: RRR, no m/r/g, +1 LE edema LLE  Respiratory: CTA, no w/r/r MSK: moving all extremities Neuro: normal affect, a&ox3 Skin: c/d/i    Vitals:   12/01/20 1043  BP: 127/74  Pulse: 65  Temp:  98.3 F (36.8 C)  TempSrc: Oral  SpO2: 100%  Weight: 258 lb 1.6 oz (117.1 kg)  Height: 5\' 11"  (1.803 m)    Assessment & Plan:   See Encounters Tab for problem based charting.  Patient discussed with Dr. 01/29/21

## 2020-12-01 NOTE — Assessment & Plan Note (Signed)
She is now following with [facility] for postthrombotic syndrome. She tries to wear her compression stockings daily but does not always do this, usually around 4-5 days per week. They do continue to help when she wears them. No SOB, chest pain, increased lower extremity swelling from baseline.  She is on xarelto 5mg  bid. She has had increased menorrhagia recently, see problem list under menorrhagia.   - continue xarelto 5 mg bid

## 2020-12-02 LAB — CBC
Hematocrit: 37.8 % (ref 34.0–46.6)
Hemoglobin: 12.3 g/dL (ref 11.1–15.9)
MCH: 28.4 pg (ref 26.6–33.0)
MCHC: 32.5 g/dL (ref 31.5–35.7)
MCV: 87 fL (ref 79–97)
Platelets: 403 10*3/uL (ref 150–450)
RBC: 4.33 x10E6/uL (ref 3.77–5.28)
RDW: 12.7 % (ref 11.7–15.4)
WBC: 5.9 10*3/uL (ref 3.4–10.8)

## 2020-12-02 LAB — LIPID PANEL
Chol/HDL Ratio: 3.6 ratio (ref 0.0–4.4)
Cholesterol, Total: 153 mg/dL (ref 100–199)
HDL: 42 mg/dL (ref >39)
LDL Chol Calc (NIH): 100 mg/dL — ABNORMAL HIGH (ref 0–99)
Triglycerides: 53 mg/dL (ref 0–149)
VLDL Cholesterol Cal: 11 mg/dL (ref 5–40)

## 2020-12-03 NOTE — Progress Notes (Signed)
Internal Medicine Clinic Attending  Case discussed with Dr. Seawell at the time of the visit.  We reviewed the resident's history and exam and pertinent patient test results.  I agree with the assessment, diagnosis, and plan of care documented in the resident's note.  Alexander Raines, M.D., Ph.D.  

## 2020-12-23 ENCOUNTER — Encounter: Payer: Self-pay | Admitting: *Deleted

## 2021-02-10 ENCOUNTER — Other Ambulatory Visit: Payer: Self-pay | Admitting: Obstetrics and Gynecology

## 2021-02-10 DIAGNOSIS — Z1231 Encounter for screening mammogram for malignant neoplasm of breast: Secondary | ICD-10-CM

## 2021-03-11 ENCOUNTER — Ambulatory Visit: Payer: Self-pay

## 2021-03-11 NOTE — Addendum Note (Signed)
Addended by: Neomia Dear on: 03/11/2021 05:38 PM   Modules accepted: Orders

## 2021-05-25 ENCOUNTER — Telehealth: Payer: Self-pay

## 2021-05-25 ENCOUNTER — Encounter: Payer: Self-pay | Admitting: *Deleted
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
## 2022-05-05 ENCOUNTER — Other Ambulatory Visit (HOSPITAL_COMMUNITY): Payer: Self-pay

## 2022-05-05 ENCOUNTER — Encounter: Payer: Self-pay | Admitting: Internal Medicine

## 2022-05-05 ENCOUNTER — Encounter: Payer: Self-pay | Admitting: Student

## 2022-05-05 ENCOUNTER — Ambulatory Visit: Payer: Self-pay | Admitting: Student

## 2022-05-05 VITALS — BP 130/88 | HR 85 | Ht 71.0 in | Wt 275.7 lb

## 2022-05-05 DIAGNOSIS — J432 Centrilobular emphysema: Secondary | ICD-10-CM

## 2022-05-05 DIAGNOSIS — J069 Acute upper respiratory infection, unspecified: Secondary | ICD-10-CM

## 2022-05-05 DIAGNOSIS — I2699 Other pulmonary embolism without acute cor pulmonale: Secondary | ICD-10-CM

## 2022-05-05 DIAGNOSIS — I82409 Acute embolism and thrombosis of unspecified deep veins of unspecified lower extremity: Secondary | ICD-10-CM

## 2022-05-05 MED ORDER — BENZONATATE 100 MG PO CAPS
100.0000 mg | ORAL_CAPSULE | Freq: Three times a day (TID) | ORAL | 1 refills | Status: DC | PRN
  Filled 2022-05-05: qty 30, 10d supply, fill #0

## 2022-05-05 MED ORDER — APIXABAN 5 MG PO TABS: 5.0000 mg | ORAL_TABLET | Freq: Two times a day (BID) | ORAL | 3 refills | Status: DC

## 2022-05-05 MED ORDER — APIXABAN 5 MG PO TABS
5.0000 mg | ORAL_TABLET | Freq: Two times a day (BID) | ORAL | 5 refills | Status: DC
  Filled 2022-05-05 – 2022-05-06 (×2): qty 60, 30d supply, fill #0

## 2022-05-05 NOTE — Patient Instructions (Addendum)
For your chest congestion please use mucinex DM. If this does not improve over the next week or you develop fevers and chills or shortness of breath please call our office or if we are unavailable go to the emergency room.   For your laceration, please keep it clean with warm soap and water, three days neosporin and cover with a band aid at night.    [facility]   Contact us Phone: [phone] Fax: [phone]  Mailing Address [facility] [place], [place] [address]  Staff [facility], MPH O: [phone] C: [phone]  [facility] Operations Manager Rosezella [place]. Manson Passey [phone]

## 2022-05-06 ENCOUNTER — Other Ambulatory Visit (HOSPITAL_COMMUNITY): Payer: Self-pay

## 2022-05-07 DIAGNOSIS — J069 Acute upper respiratory infection, unspecified: Secondary | ICD-10-CM | POA: Insufficient documentation

## 2022-05-07 HISTORY — DX: Acute upper respiratory infection, unspecified: J06.9

## 2022-05-07 NOTE — Assessment & Plan Note (Signed)
Repeat CTa chest w/o evidence of emphysema.

## 2022-05-07 NOTE — Assessment & Plan Note (Signed)
Patient is currently on eliquis 5mg  BID. States she receives 3 month supply from pharmacy. Discussed with our pharmacy staff and no record of patient being set up with pharmaceutical company. [place] [facility] pharmacy unable to use coupon to reduce cost of eliquis. Will discuss with our pharmacy staff getting patient reconnected with regular supplies from pharmaceutical company if this is not possible will transition patient to xarelto which is available for four dollars at her [facility].   Patient does have 10 d left of her eliquis.

## 2022-05-07 NOTE — Progress Notes (Signed)
   CC: chest congestion  HPI:  Kara Mcclain is a 51 y.o. F w/ PMH per below who presents for chest congestion. Please see problem based charting under encounters tab for further details.    Past Medical History:  Diagnosis Date   Chest pain    2/2 related to PE with component of anxiety versus pericarditis  Improves with tylenol prn a few times a week EKG with long QTc and nonspecific t wave changes. 2d echo 08/23/2013 - normal  08/26/2013 referred to cards for evaluation Lipid check ordered.    DVT (deep venous thrombosis) (HCC)    "LLE" (07/04/2013)   Pulmonary embolism (HCC)    Review of Systems:  Please see problem based charting under encounters tab for further details.    Physical Exam:  Vitals:   05/05/22 1322  BP: 130/88  Pulse: 85  SpO2: 96%  Weight: 275 lb 11.2 oz (125.1 kg)  Height: 5\' 11"  (1.803 m)    Constitutional: Well-developed, well-nourished, and in no distress.  HENT:  Head: Normocephalic and atraumatic.  Eyes: EOM are normal.  Neck: Normal range of motion.  Cardiovascular: Normal rate, regular rhythm, intact distal pulses. No gallop and no friction rub.  No murmur heard. No lower extremity edema  Pulmonary: Non labored breathing on room air, no wheezing or rales  Abdominal: Soft. Normal bowel sounds. Non distended and non tender Musculoskeletal: Normal range of motion.        General: No tenderness or edema.  Neurological: Alert and oriented to person, place, and time. Non focal  Skin: Skin is warm and dry.    Assessment & Plan:   See Encounters Tab for problem based charting.  Patient discussed with Dr. 

## 2022-05-07 NOTE — Assessment & Plan Note (Signed)
Patient presents with a week of chest congestion. She has had a productive cough of clear sputum. No fevers or chills. She has had no dyspnea. She has take nyquil to minimal effect.  -Will start mucinex DM and tessalon perles, if patients symptoms do not resolve in 1 week she was instructed to schedule an appointment with our clinic.

## 2022-05-09 NOTE — Progress Notes (Signed)
Internal Medicine Clinic Attending  Case discussed with Dr. Carter  At the time of the visit.  We reviewed the resident's history and exam and pertinent patient test results.  I agree with the assessment, diagnosis, and plan of care documented in the resident's note.  

## 2022-05-13 ENCOUNTER — Other Ambulatory Visit (HOSPITAL_COMMUNITY): Payer: Self-pay

## 2022-12-15 ENCOUNTER — Ambulatory Visit (HOSPITAL_COMMUNITY)
Admission: RE | Admit: 2022-12-15 | Discharge: 2022-12-15 | Disposition: A | Discharge status: Home / Self Care | Payer: Self-pay | Source: Ambulatory Visit | Attending: Student in an Organized Health Care Education/Training Program | Admitting: Student in an Organized Health Care Education/Training Program

## 2022-12-15 ENCOUNTER — Encounter: Payer: Self-pay | Admitting: Student

## 2022-12-15 ENCOUNTER — Other Ambulatory Visit: Payer: Self-pay

## 2022-12-15 ENCOUNTER — Ambulatory Visit: Payer: Self-pay | Admitting: Student

## 2022-12-15 VITALS — BP 115/63 | HR 86 | Temp 98.1°F | Ht 71.0 in | Wt 271.8 lb

## 2022-12-15 DIAGNOSIS — I2699 Other pulmonary embolism without acute cor pulmonale: Secondary | ICD-10-CM

## 2022-12-15 DIAGNOSIS — S82839A Other fracture of upper and lower end of unspecified fibula, initial encounter for closed fracture: Secondary | ICD-10-CM | POA: Insufficient documentation

## 2022-12-15 DIAGNOSIS — S99912A Unspecified injury of left ankle, initial encounter: Secondary | ICD-10-CM | POA: Insufficient documentation

## 2022-12-15 DIAGNOSIS — Z0000 Encounter for general adult medical examination without abnormal findings: Secondary | ICD-10-CM

## 2022-12-15 DIAGNOSIS — Z23 Encounter for immunization: Secondary | ICD-10-CM

## 2022-12-15 NOTE — Assessment & Plan Note (Signed)
Received flu shot today. 

## 2022-12-15 NOTE — Progress Notes (Signed)
   CC: left ankle inversion injury, initial visit  HPI:  Kara Mcclain is a 52 y.o. female with history listed below presenting to the [facility] for left ankle inversion injury, initial visit. Please see individualized problem based charting for full HPI.  Past Medical History:  Diagnosis Date   Chest pain    2/2 related to PE with component of anxiety versus pericarditis  Improves with tylenol prn a few times a week EKG with long QTc and nonspecific t wave changes. 2d echo 08/23/2013 - normal  08/26/2013 referred to cards for evaluation Lipid check ordered.    DVT (deep venous thrombosis) ([place])    "LLE" (07/04/2013)   Pulmonary embolism (HCC)     Review of Systems:  Negative aside from that listed in individualized problem based charting.  Physical Exam:  Vitals:   12/15/22 1352  BP: 115/63  Pulse: 86  Temp: 98.1 F (36.7 C)  TempSrc: Oral  SpO2: 99%  Weight: 271 lb 12.8 oz (123.3 kg)  Height: 5\' 11"  (1.803 m)   Physical Exam Constitutional:      Appearance: Normal appearance. She is obese. She is not ill-appearing.  HENT:     Mouth/Throat:     Mouth: Mucous membranes are moist.     Pharynx: Oropharynx is clear.  Eyes:     Extraocular Movements: Extraocular movements intact.     Conjunctiva/sclera: Conjunctivae normal.     Pupils: Pupils are equal, round, and reactive to light.  Cardiovascular:     Rate and Rhythm: Normal rate and regular rhythm.     Pulses: Normal pulses.     Heart sounds: Normal heart sounds. No murmur heard.    No friction rub. No gallop.  Pulmonary:     Effort: Pulmonary effort is normal.     Breath sounds: Normal breath sounds. No wheezing, rhonchi or rales.  Abdominal:     General: Bowel sounds are normal. There is no distension.     Palpations: Abdomen is soft.     Tenderness: There is no abdominal tenderness.  Musculoskeletal:        General: Swelling present.     Comments: Chronic swelling of bilateral lower extremities. Point tenderness  over edge of LLE lateral malleolus. No overlying redness, erythema, or deformity. No laxity with active inversion, eversion, flexion, or extension but does have mild tenderness at lateral malleolus with these maneuvers.  Skin:    General: Skin is warm and dry.  Neurological:     General: No focal deficit present.     Mental Status: She is alert and oriented to person, place, and time.  Psychiatric:        Mood and Affect: Mood normal.        Behavior: Behavior normal.      Assessment & Plan:   See Encounters Tab for problem based charting.  Patient discussed with Dr. Evette Doffing

## 2022-12-15 NOTE — Assessment & Plan Note (Addendum)
Patient reports experiencing an inversion ankle injury about 5 days ago when her foot slipped on a carpet at an [employer] that her friend rented. States she was walking but the carpet was loose and slipped underneath her leading to her ankle twisting. She was initially unable to bear weight on her left foot and used rest, ice, compression, and elevation along with tylenol prn for pain relief. States that her ankle pain is improving gradually and feels better today. She has been able to bear weight over the past few days although has a noticeable limp and favors her right leg. She is currently uninsured and thus wanted to avoid ankle films if possible.   On exam, she does have chronic bilateral lower extremity swelling. She has point tenderness on the edge of her lateral malleolus. No overlying redness or deformity. No laxity with active inversion, eversion, flexion, or extension but does have mild tenderness at lateral malleolus with these maneuvers.   Given that she was initially unable to bear weight for a couple of days and is having point tenderness over edge of lateral malleolus, unable to rule out fracture per [place] ankle rules. We discussed obtaining left ankle films for further evaluation, which she is agreeable to. Advised her to continue using RICE and tylenol prn.  Plan: -f/u left ankle films -RICE, tylenol prn  ADDENDUM: Ankle x-ray showing minimally displaced distal fibula fracture of left ankle. I have discussed these findings with her. Will send referral to orthopedic surgery for further evaluation and management. She will need at least a CAM walking boot for additional stability in order to allow for fracture to heal. Unfortunately, she is uninsured so she will need to pay for this out-of-pocket.

## 2022-12-15 NOTE — Patient Instructions (Signed)
Kara Mcclain,  It was a pleasure seeing you in the clinic today.   We are getting an x-ray to make sure there is no fracture. Please continue to elevate, ice, and use as needed tylenol (spread apart by 6-8 hours). Please let us know if you are unable to get your eliquis and we can switch you over to xarelto like we discussed. You got your flu shot today. Please come back in 1 month for your next visit.  Please call our clinic at [phone] if you have any questions or concerns. The best time to call is Monday-Friday from 9am-4pm, but there is someone available 24/7 at the same number. If you need medication refills, please notify your pharmacy one week in advance and they will send [place] a request.   Thank you for letting us take part in your care. We look forward to seeing you next time!

## 2022-12-15 NOTE — Assessment & Plan Note (Signed)
Patient with history of recurrent DVT/PE. She is currently on eliquis 5mg  BID and has been receiving samples for this through an agency every month. Unfortunately, she is not always able to get these on time and she ran out a couple of days ago. She is currently using her sister's eliquis (same dose) while waiting for her samples. She was previously on xarelto, but was switched to eliquis due to worsening menorrhagia. We unfortunately do not have any eliquis samples at the [facility]. Discussed switching back to xarelto for consistent prescriptions (on $4 list) given she is uninsured and eliquis OOP is around $600 even with GoodRx. She states she will call her agency again to see when she will get her eliquis. If it will take too long, will reach out to [facility] to switch to xarelto. I have also messaged Camille to see if we can provide any medication assistance.  Plan: -continue eliquis 5mg  BID -if unable to obtain, switch to xarelto 20mg  daily with supper -[employer]

## 2022-12-16 ENCOUNTER — Other Ambulatory Visit: Payer: Self-pay | Admitting: Student

## 2022-12-16 ENCOUNTER — Telehealth: Payer: Self-pay

## 2022-12-16 DIAGNOSIS — S8262XA Displaced fracture of lateral malleolus of left fibula, initial encounter for closed fracture: Secondary | ICD-10-CM

## 2022-12-16 NOTE — Telephone Encounter (Signed)
Pt is requesting a call back .Marland Kitchen She was  awaiting her results from her XRAY  from 1/25  to be sent to her mychart .. I explained when the DR gets the results  is when it gets dropped in the [place]  app .Marland Kitchen She is ok with waiting till the DR calls her

## 2022-12-16 NOTE — Telephone Encounter (Signed)
The official read is not in yet. It looks to me like she does have a minimally displaced fracture of the distal fibula. She will need to obtain a CAM boot and a referral to ortho. I know insurance coverage is an issue for her. Perhaps [facility] would be an option?

## 2022-12-16 NOTE — Progress Notes (Signed)
Internal Medicine Clinic Attending  Case discussed with Dr. Jinwala  At the time of the visit.  We reviewed the resident's history and exam and pertinent patient test results.  I agree with the assessment, diagnosis, and plan of care documented in the resident's note.  

## 2022-12-19 ENCOUNTER — Other Ambulatory Visit (HOSPITAL_COMMUNITY): Payer: Self-pay

## 2022-12-19 ENCOUNTER — Telehealth: Payer: Self-pay

## 2022-12-19 NOTE — Telephone Encounter (Signed)
Reached out to patient regarding assistance for Eliquis medication. Patient says she was previously enrolled with [employer] and would like to enroll again. Pt agreed to have application mailed to her home.

## 2022-12-20 ENCOUNTER — Other Ambulatory Visit (HOSPITAL_COMMUNITY): Payer: Self-pay

## 2022-12-20 MED ORDER — TRAMADOL HCL 50 MG PO TABS
50.0000 mg | ORAL_TABLET | Freq: Every day | ORAL | 0 refills | Status: DC
  Filled 2022-12-20: qty 30, 30d supply, fill #0

## 2022-12-20 MED ORDER — D-5000 125 MCG (5000 UT) PO TABS: 1.0000 | ORAL_TABLET | Freq: Every day | ORAL | 0 refills | Status: DC

## 2022-12-28 ENCOUNTER — Other Ambulatory Visit (HOSPITAL_COMMUNITY): Payer: Self-pay

## 2022-12-28 MED ORDER — HYDROCODONE-ACETAMINOPHEN 5-325 MG PO TABS
1.0000 | ORAL_TABLET | Freq: Four times a day (QID) | ORAL | 0 refills | Status: DC | PRN
  Filled 2022-12-28: qty 12, 3d supply, fill #0

## 2022-12-29 ENCOUNTER — Telehealth: Payer: Self-pay | Admitting: *Deleted

## 2022-12-29 ENCOUNTER — Other Ambulatory Visit (HOSPITAL_COMMUNITY): Payer: Self-pay

## 2022-12-29 NOTE — Telephone Encounter (Addendum)
Patient called she stated her orthopedic doctor called her in a rx for pain meds and to disregard previous message.

## 2022-12-29 NOTE — Telephone Encounter (Signed)
Call from pt stated she fractured her left ankle last month. And she saw Ortho who prescribed Tramadol; she's currently out of Tramadol, she had called their office, waiting on a call back. But she stated Tramadol is not effective. Requesting a rx for an non-narcotic med like ES Tylenol. She was seen here on 1/25 by Dr Allyson Sabal. Offered telehealth appt tomorrow. Pt would like a call back today from the doctor.. Thanks.

## 2023-02-15 ENCOUNTER — Emergency Department (HOSPITAL_COMMUNITY): Payer: Self-pay

## 2023-02-15 ENCOUNTER — Encounter (HOSPITAL_COMMUNITY): Payer: Self-pay | Admitting: *Deleted

## 2023-02-15 ENCOUNTER — Observation Stay (HOSPITAL_COMMUNITY)
Admission: EM | Admit: 2023-02-15 | Discharge: 2023-02-16 | Disposition: A | Discharge status: Home / Self Care | Payer: Self-pay | Attending: Internal Medicine | Admitting: Internal Medicine

## 2023-02-15 ENCOUNTER — Other Ambulatory Visit: Payer: Self-pay

## 2023-02-15 ENCOUNTER — Observation Stay (HOSPITAL_BASED_OUTPATIENT_CLINIC_OR_DEPARTMENT_OTHER): Payer: Self-pay

## 2023-02-15 DIAGNOSIS — Z91148 Patient's other noncompliance with medication regimen for other reason: Secondary | ICD-10-CM

## 2023-02-15 DIAGNOSIS — Z7901 Long term (current) use of anticoagulants: Secondary | ICD-10-CM | POA: Insufficient documentation

## 2023-02-15 DIAGNOSIS — Z79899 Other long term (current) drug therapy: Secondary | ICD-10-CM | POA: Insufficient documentation

## 2023-02-15 DIAGNOSIS — E876 Hypokalemia: Secondary | ICD-10-CM

## 2023-02-15 DIAGNOSIS — I2694 Multiple subsegmental pulmonary emboli without acute cor pulmonale: Principal | ICD-10-CM | POA: Insufficient documentation

## 2023-02-15 DIAGNOSIS — Z86718 Personal history of other venous thrombosis and embolism: Secondary | ICD-10-CM | POA: Insufficient documentation

## 2023-02-15 DIAGNOSIS — I2699 Other pulmonary embolism without acute cor pulmonale: Secondary | ICD-10-CM

## 2023-02-15 DIAGNOSIS — Z6836 Body mass index (BMI) 36.0-36.9, adult: Secondary | ICD-10-CM | POA: Insufficient documentation

## 2023-02-15 DIAGNOSIS — D75839 Thrombocytosis, unspecified: Secondary | ICD-10-CM

## 2023-02-15 DIAGNOSIS — E669 Obesity, unspecified: Secondary | ICD-10-CM

## 2023-02-15 LAB — BASIC METABOLIC PANEL
Anion gap: 9 (ref 5–15)
BUN: 10 mg/dL (ref 6–20)
CO2: 23 mmol/L (ref 22–32)
Calcium: 9.2 mg/dL (ref 8.9–10.3)
Chloride: 106 mmol/L (ref 98–111)
Creatinine, Ser: 0.79 mg/dL (ref 0.44–1.00)
GFR, Estimated: >60 mL/min (ref >60)
Glucose, Bld: 121 mg/dL — ABNORMAL HIGH (ref 70–99)
Potassium: 2.9 mmol/L — ABNORMAL LOW (ref 3.5–5.1)
Sodium: 138 mmol/L (ref 135–145)

## 2023-02-15 LAB — APTT
aPTT: 56 seconds — ABNORMAL HIGH (ref 24–36)
aPTT: 48 seconds — ABNORMAL HIGH (ref 24–36)
aPTT: 52 seconds — ABNORMAL HIGH (ref 24–36)

## 2023-02-15 LAB — HEPARIN LEVEL (UNFRACTIONATED)
Heparin Unfractionated: 0.58 IU/mL (ref 0.30–0.70)
Heparin Unfractionated: 0.59 IU/mL (ref 0.30–0.70)
Heparin Unfractionated: 0.44 IU/mL (ref 0.30–0.70)

## 2023-02-15 LAB — HIV ANTIBODY (ROUTINE TESTING W REFLEX): HIV Screen 4th Generation wRfx: NONREACTIVE

## 2023-02-15 LAB — CBC
HCT: 37 % (ref 36.0–46.0)
Hemoglobin: 12.2 g/dL (ref 12.0–15.0)
MCH: 28.8 pg (ref 26.0–34.0)
MCHC: 33 g/dL (ref 30.0–36.0)
MCV: 87.3 fL (ref 80.0–100.0)
Platelets: 500 10*3/uL — ABNORMAL HIGH (ref 150–400)
RBC: 4.24 MIL/uL (ref 3.87–5.11)
RDW: 13.1 % (ref 11.5–15.5)
WBC: 7.4 10*3/uL (ref 4.0–10.5)
nRBC: 0 % (ref 0.0–0.2)

## 2023-02-15 LAB — MAGNESIUM: Magnesium: 1.9 mg/dL (ref 1.7–2.4)

## 2023-02-15 LAB — TROPONIN I (HIGH SENSITIVITY)
Troponin I (High Sensitivity): 3 ng/L (ref <18)
Troponin I (High Sensitivity): 2 ng/L (ref <18)

## 2023-02-15 MED ORDER — HEPARIN (PORCINE) 25000 UT/250ML-% IV SOLN
1700.0000 [IU]/h | INTRAVENOUS | Status: AC
  Administered 2023-02-15 (×2): 1700 [IU]/h via INTRAVENOUS
  Filled 2023-02-15 (×2): qty 250

## 2023-02-15 MED ORDER — METOPROLOL TARTRATE 5 MG/5ML IV SOLN: 5.0000 mg | Freq: Four times a day (QID) | INTRAVENOUS | Status: DC | PRN

## 2023-02-15 MED ORDER — DOCUSATE SODIUM 100 MG PO CAPS
100.0000 mg | ORAL_CAPSULE | Freq: Two times a day (BID) | ORAL | Status: DC
  Administered 2023-02-15 – 2023-02-16 (×3): 100 mg via ORAL
  Filled 2023-02-15 (×4): qty 1

## 2023-02-15 MED ORDER — ACETAMINOPHEN 325 MG PO TABS
650.0000 mg | ORAL_TABLET | Freq: Four times a day (QID) | ORAL | Status: DC | PRN
  Administered 2023-02-15: 650 mg via ORAL
  Filled 2023-02-15: qty 2

## 2023-02-15 MED ORDER — TRAMADOL HCL 50 MG PO TABS
50.0000 mg | ORAL_TABLET | ORAL | Status: DC | PRN
  Administered 2023-02-15 – 2023-02-16 (×2): 50 mg via ORAL
  Filled 2023-02-15 (×2): qty 1

## 2023-02-15 MED ORDER — MAGNESIUM SULFATE 2 GM/50ML IV SOLN
2.0000 g | Freq: Once | INTRAVENOUS | Status: AC
  Administered 2023-02-15: 2 g via INTRAVENOUS
  Filled 2023-02-15: qty 50

## 2023-02-15 MED ORDER — POTASSIUM CHLORIDE CRYS ER 20 MEQ PO TBCR
40.0000 meq | EXTENDED_RELEASE_TABLET | Freq: Once | ORAL | Status: AC
  Administered 2023-02-15: 40 meq via ORAL
  Filled 2023-02-15: qty 2

## 2023-02-15 MED ORDER — HEPARIN BOLUS VIA INFUSION
4000.0000 [IU] | Freq: Once | INTRAVENOUS | Status: AC
  Administered 2023-02-15: 4000 [IU] via INTRAVENOUS
  Filled 2023-02-15: qty 4000

## 2023-02-15 MED ORDER — BENZONATATE 100 MG PO CAPS: 100.0000 mg | ORAL_CAPSULE | Freq: Three times a day (TID) | ORAL | Status: DC | PRN

## 2023-02-15 MED ORDER — IOHEXOL 350 MG/ML SOLN
100.0000 mL | Freq: Once | INTRAVENOUS | Status: AC | PRN
  Administered 2023-02-15: 100 mL via INTRAVENOUS

## 2023-02-15 MED ORDER — ALBUTEROL SULFATE (2.5 MG/3ML) 0.083% IN NEBU: 2.5000 mg | INHALATION_SOLUTION | RESPIRATORY_TRACT | Status: DC | PRN

## 2023-02-15 MED ORDER — SODIUM CHLORIDE (PF) 0.9 % IJ SOLN
INTRAMUSCULAR | Status: AC
  Filled 2023-02-15: qty 50

## 2023-02-15 MED ORDER — ONDANSETRON HCL 4 MG/2ML IJ SOLN: 4.0000 mg | Freq: Four times a day (QID) | INTRAMUSCULAR | Status: DC | PRN

## 2023-02-15 MED ORDER — POTASSIUM CHLORIDE 10 MEQ/100ML IV SOLN
10.0000 meq | Freq: Once | INTRAVENOUS | Status: AC
  Administered 2023-02-15: 10 meq via INTRAVENOUS
  Filled 2023-02-15: qty 100

## 2023-02-15 MED ORDER — TRAZODONE HCL 50 MG PO TABS: 25.0000 mg | ORAL_TABLET | Freq: Every evening | ORAL | Status: DC | PRN

## 2023-02-15 MED ORDER — ONDANSETRON HCL 4 MG PO TABS: 4.0000 mg | ORAL_TABLET | Freq: Four times a day (QID) | ORAL | Status: DC | PRN

## 2023-02-15 NOTE — ED Notes (Addendum)
Pt using the bathroom at this time. 

## 2023-02-15 NOTE — ED Triage Notes (Signed)
Pt from home for sob onset today, worsening tonight. Hx of DVT and PE, currently taking Eliquis, denies any recent missed doses (medication restarted a month ago). Pt also reports L ankle swelling, fractured ankle in February, noticed increased swelling today.

## 2023-02-15 NOTE — Progress Notes (Signed)
ANTICOAGULATION CONSULT NOTE -   Consult  Pharmacy Consult for Heparin Indication: pulmonary embolus  Allergies  Allergen Reactions   Ibuprofen Swelling   Shellfish Allergy Swelling    Patient Measurements: Height: 5\' 11"  (180.3 cm) Weight: 119.9 kg (264 lb 4.8 oz) IBW/kg (Calculated) : 70.8 Heparin Dosing Weight: 98.5 kg  Weight 119.9 kg, heparin weight 98 kg Height = 71"  Vital Signs: Temp: 98.2 F (36.8 C) (03/27 0957) Temp Source: Oral (03/27 0957) BP: 145/91 (03/27 0957) Pulse Rate: 86 (03/27 0957)  Labs: Recent Labs    02/15/23 0023 02/15/23 0245 02/15/23 [id number] 02/15/23 0751 02/15/23 1007 02/15/23 1211 02/15/23 1645  HGB 12.2  --   --   --   --   --   --   HCT 37.0  --   --   --   --   --   --   PLT 500*  --   --   --   --   --   --   APTT  --   --   --  56*  --  48* 52*  HEPARINUNFRC  --   --    < > 0.58 0.59  --  0.44  CREATININE 0.79  --   --   --   --   --   --   TROPONINIHS 3 2  --   --   --   --   --    < > = values in this interval not displayed.     Estimated Creatinine Clearance: 121.4 mL/min (by C-G formula based on SCr of 0.79 mg/dL).   Medical History: Past Medical History:  Diagnosis Date   Chest pain    2/2 related to PE with component of anxiety versus pericarditis  Improves with tylenol prn a few times a week EKG with long QTc and nonspecific t wave changes. 2d echo 08/23/2013 - normal  08/26/2013 referred to cards for evaluation Lipid check ordered.    DVT (deep venous thrombosis) ([place])    "LLE" (07/04/2013)   Pulmonary embolism (HCC)    URI (upper respiratory infection) 05/07/2022    Medications:  PTA Eliquis 5mg  po BID - patient reports last dose taken 02/14/23 @ 0700  (noted medication restarted a month ago)  Assessment: 52 yr female with h/o DVT/PE presents with shortness of breath.  S/p ankle fracture in February and noted recent increased swelling. CTAngio = + right upper lobe and left lower lobe PE.  No evidence of right heart  strain Due to recent apixaban use, anticipate heparin levels to be falsely elevated.  So will monitor heparin therapy with aPTT until effects of apixaban have worn off and aPTT and heparin levels correlate.  Heparin level therapeutic on current IV heparin rate of 1700 units/hr aPTT low but with therapeutic heparin level will no longer need to check aPTT levels No reported bleeding or issues Note conflicting information as was previously reported that patient has been off an on Eliquis due to affordability issues and is s/p ankle fx surgery in Feb, BUT oncology states that patient has been faithfully taking Eliquis  Goal of Therapy:  Heparin level 0.3-0.7 units/ml aPTT 66-102 seconds Monitor platelets by anticoagulation protocol: Yes   Plan:  Continue heparin at  1700 units/hr Daily heparin level and CBC    Adrian Saran, PharmD, BCPS Secure Chat if ?s 02/15/2023 6:07 PM

## 2023-02-15 NOTE — Plan of Care (Addendum)
This is a 52 year old female with history of DVT/PE on chronic Eliquis.  Patient states she has been compliant.  She developed shortness of breath today.  In the ER CTA positive for right lower lower lobe and right upper lobe..  Patient started on heparin drip.  Potassium 2.9.  Magnesium level ordered.  Admission requested

## 2023-02-15 NOTE — Progress Notes (Signed)
ANTICOAGULATION CONSULT NOTE - Initial Consult  Pharmacy Consult for Heparin Indication: pulmonary embolus  Allergies  Allergen Reactions   Ibuprofen    Shellfish Allergy     Patient Measurements:   Heparin Dosing Weight: 98.5 kg  Weight (11/2022) = 123.3 kg Height = 71"  Vital Signs: Temp: 99.1 F (37.3 C) (03/27 0013) Temp Source: Oral (03/27 0013) BP: 118/69 (03/27 0300) Pulse Rate: 78 (03/27 0300)  Labs: Recent Labs    02/15/23 0023 02/15/23 0245  HGB 12.2  --   HCT 37.0  --   PLT 500*  --   CREATININE 0.79  --   TROPONINIHS 3 2    CrCl cannot be calculated (Unknown ideal weight.).   Medical History: Past Medical History:  Diagnosis Date   Chest pain    2/2 related to PE with component of anxiety versus pericarditis  Improves with tylenol prn a few times a week EKG with long QTc and nonspecific t wave changes. 2d echo 08/23/2013 - normal  08/26/2013 referred to cards for evaluation Lipid check ordered.    DVT (deep venous thrombosis) ([place])    "LLE" (07/04/2013)   Pulmonary embolism (HCC)    URI (upper respiratory infection) 05/07/2022    Medications:  PTA Eliquis 5mg  po BID - patient reports last dose taken 02/14/23 @ 0700  (noted medication restarted a month ago)  Assessment: 52 yr female with h/o DVT/PE presents with shortness of breath.  S/p ankle fracture in February and noted recent increased swelling CTAngio = + right upper lobe and left lower lobe PE.  No evidence of right heart strain Due to recent apixaban use, anticipate heparin levels to be falsely elevated.  So will monitor heparin therapy with aPTT until effects of apixaban have worn off and aPTT and heparin levels correlate.  Goal of Therapy:  Heparin level 0.3-0.7 units/ml aPTT 66-102 seconds Monitor platelets by anticoagulation protocol: Yes   Plan:  Baseline aPTT/Heparin level Heparin 4000 units IV x 1 followed by heparin gtt @ 1700 units/hr Check aPTT 6 hr after heparin gtt  started Daily heparin level and CBC  Josephanthony Tindel, Toribio Harbour, PharmD 02/15/2023,5:58 AM

## 2023-02-15 NOTE — ED Provider Notes (Signed)
[place] EMERGENCY DEPARTMENT AT [facility] Provider Note   CSN: [id number] Arrival date & time: 02/15/23  0002     History  Chief Complaint  Patient presents with   Shortness of Breath   Joint Swelling    Kara Mcclain is a 52 y.o. female.  Patient with history of DVT/PE on Elliquis presents today with complaints of shortness of breath. She states that same began yesterday morning and has been persistent since. She also notes that she broke her ankle back in January and it had been healing well until this morning when she noticed that it is more swollen this morning. She is concerned for PE/DVT. She has not missed any doses of Elliquis and her last dose was yesterday morning. She denies chest pain, nausea, vomiting, diarrhea, or abdominal pain.   The history is provided by the patient. No language interpreter was used.  Shortness of Breath      Home Medications Prior to Admission medications   Medication Sig Start Date End Date Taking? Authorizing Provider  acetaminophen (TYLENOL) 325 MG tablet Take 650 mg by mouth every 6 (six) hours as needed for mild pain or headache.     [provider]  apixaban (ELIQUIS) 5 MG TABS tablet Take 1 tablet (5 mg total) by mouth 2 (two) times daily. 05/05/22   Rick Duff, MD  benzonatate (TESSALON PERLES) 100 MG capsule Take 1 capsule (100 mg total) by mouth 3 (three) times daily as needed for cough. 05/05/22   Rick Duff, MD  Cholecalciferol (D-5000) 125 MCG (5000 UT) TABS Take 1 tablet every day by mouth. 12/20/22     HYDROcodone-acetaminophen (NORCO/VICODIN) 5-325 MG tablet Take 1 tablet by mouth every 6 (six) hours as needed for 3 days 12/28/22     traMADol (ULTRAM) 50 MG tablet Take 1 tablet every 4 hours by mouth as needed. 12/20/22         Allergies    Ibuprofen and Shellfish allergy    Review of Systems   Review of Systems  Respiratory:  Positive for shortness of breath.   Cardiovascular:  Positive  for leg swelling.  All other systems reviewed and are negative.   Physical Exam Updated Vital Signs BP 118/69   Pulse 78   Temp 99.1 F (37.3 C) (Oral)   Resp (!) 23   LMP 01/20/2023   SpO2 100%  Physical Exam Vitals and nursing note reviewed.  Constitutional:      General: She is not in acute distress.    Appearance: Normal appearance. She is normal weight. She is not ill-appearing, toxic-appearing or diaphoretic.  HENT:     Head: Normocephalic and atraumatic.  Cardiovascular:     Rate and Rhythm: Normal rate and regular rhythm.     Pulses: Normal pulses.     Heart sounds: Normal heart sounds.  Pulmonary:     Effort: Pulmonary effort is normal. No respiratory distress.     Breath sounds: Normal breath sounds.  Abdominal:     Palpations: Abdomen is soft.     Tenderness: There is no abdominal tenderness.  Musculoskeletal:        General: Normal range of motion.     Cervical back: Normal range of motion.     Comments: Swelling noted to the left ankle area without erythema or warmth. DP and PT pulses intact and 2+. No calf tenderness  Skin:    General: Skin is warm and dry.  Neurological:     General:  No focal deficit present.     Mental Status: She is alert.  Psychiatric:        Mood and Affect: Mood normal.        Behavior: Behavior normal.     ED Results / Procedures / Treatments   Labs (all labs ordered are listed, but only abnormal results are displayed) Labs Reviewed  BASIC METABOLIC PANEL - Abnormal; Notable for the following components:      Result Value   Potassium 2.9 (*)    Glucose, Bld 121 (*)    All other components within normal limits  CBC - Abnormal; Notable for the following components:   Platelets 500 (*)    All other components within normal limits  APTT  HEPARIN LEVEL (UNFRACTIONATED)  APTT  TROPONIN I (HIGH SENSITIVITY)  TROPONIN I (HIGH SENSITIVITY)    EKG EKG Interpretation  Date/Time:  Wednesday February 15 2023 00:38:41  EDT Ventricular Rate:  69 PR Interval:  149 QRS Duration: 96 QT Interval:  438 QTC Calculation: 470 R Axis:   50 Text Interpretation: Sinus rhythm Confirmed by Dory Horn) on 02/15/2023 3:24:52 AM  Radiology DG Chest 2 View  Result Date: 02/15/2023 CLINICAL DATA:  Shortness of breath and lower extremity swelling. History of DVT and embolism. EXAM: CHEST - 2 VIEW COMPARISON:  06/15/2018. FINDINGS: The heart size and mediastinal contours are within normal limits. Peribronchial cuffing and perihilar interstitial thickening is noted bilaterally. No consolidation, effusion, or pneumothorax. No acute osseous abnormality. IMPRESSION: Peribronchial cuffing with perihilar interstitial thickening bilaterally, possible reactive airways disease versus bronchitis. Electronically Signed   By: Brett Fairy M.D.   On: 02/15/2023 00:38    Procedures .Critical Care  Performed by: Bud Face, PA-C Authorized by: Bud Face, PA-C   Critical care provider statement:    Critical care time (minutes):  30   Critical care start time:  02/15/2023 5:30 AM   Critical care end time:  02/15/2023 6:00 AM   Critical care was necessary to treat or prevent imminent or life-threatening deterioration of the following conditions:  Circulatory failure and metabolic crisis   Critical care was time spent personally by me on the following activities:  Development of treatment plan with patient or surrogate, discussions with consultants, discussions with primary provider, evaluation of patient's response to treatment, examination of patient, obtaining history from patient or surrogate, ordering and review of laboratory studies, ordering and review of radiographic studies, pulse oximetry, re-evaluation of patient's condition and review of old charts   Care discussed with: admitting provider       Medications Ordered in ED Medications  sodium chloride (PF) 0.9 % injection (has no administration in time range)   heparin bolus via infusion 4,000 Units (4,000 Units Intravenous Bolus from Bag 02/15/23 0613)    Followed by  heparin ADULT infusion 100 units/mL (25000 units/283mL) (1,700 Units/hr Intravenous New Bag/Given 02/15/23 0613)  potassium chloride SA (KLOR-CON M) CR tablet 40 mEq (40 mEq Oral Given 02/15/23 0409)  potassium chloride 10 mEq in 100 mL IVPB (0 mEq Intravenous Stopped 02/15/23 0615)  magnesium sulfate IVPB 2 g 50 mL (0 g Intravenous Stopped 02/15/23 0615)  iohexol (OMNIPAQUE) 350 MG/ML injection 100 mL (100 mLs Intravenous Contrast Given 02/15/23 0433)    ED Course/ Medical Decision Making/ A&P                             Medical Decision Making Amount and/or  Complexity of Data Reviewed Labs: ordered. Radiology: ordered.  Risk Prescription drug management.   This patient is a 52 y.o. female who presents to the ED for concern of shortness of breath, this involves an extensive number of treatment options, and is a complaint that carries with it a high risk of complications and morbidity. The emergent differential diagnosis prior to evaluation includes, but is not limited to,  DVT/PE, CHF, pneumonia . This is not an exhaustive differential.   Past Medical History / Co-morbidities / Social History: Hx DVT/PE on Elliquis  Physical Exam: Physical exam performed. The pertinent findings include: Lung sounds CTA in all fields.  Swelling noted to the left ankle area without erythema or warmth. DP and PT pulses intact and 2+. No calf tenderness   Lab Tests: I ordered, and personally interpreted labs.  The pertinent results include:  K 2.9, troponin negative    Imaging Studies: I ordered imaging studies including CXR, CTA PE. I independently visualized and interpreted imaging which showed   CXR: Peribronchial cuffing with perihilar interstitial thickening bilaterally, possible reactive airways disease versus bronchitis.  CT PE:   1. Right upper lobe and left lower lobe pulmonary  emboli. No evidence of right heart strain. 2. A few ground-glass opacities in the right upper lobe, possible edema or infiltrate.  I agree with the radiologist interpretation.   Cardiac Monitoring:  The patient was maintained on a cardiac monitor.  My attending physician Dr. Randal Buba viewed and interpreted the cardiac monitored which showed an underlying rhythm of: sinus rhythm. I agree with this interpretation.   Medications: I ordered medication including potassium, magnesium, heparin  for hypokalemia, hypomagnesemia, PE. Reevaluation of the patient after these medicines showed that the patient stayed the same. I have reviewed the patients home medicines and have made adjustments as needed.   Disposition:  Patient presents today with complaints of shortness of breath. Hx PE on Elliquis, no missed doses. CT shows new PEs, no heart strain. She is hemodynamically stable.  Patient will need admission for same. She is understanding and in agreement.   Findings and plan of care discussed with supervising physician Dr. Randal Buba who is in agreement.     Final Clinical Impression(s) / ED Diagnoses Final diagnoses:  Multiple subsegmental pulmonary emboli without acute cor pulmonale [facility])    Rx / DC Orders ED Discharge Orders     None         Nestor Lewandowsky 02/15/23 [place], April, MD 02/15/23 [phone]

## 2023-02-15 NOTE — H&P (Signed)
History and Physical  Kara Mcclain [id number] DOB: 02-26-1971 DOA: 02/15/2023  PCP: Gaylan Gerold, DO   Chief Complaint: Chest pain, Leg swelling   HPI: Kara Mcclain is a 52 y.o. 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  The patient has a long history of venous thromboembolism, starting in about 2014 when she had a left lower extremity DVT which the patient says was unprovoked.  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  She was initially started on Coumadin, and took this for a year and tolerated well.  Medication was then discontinued, after which she got pregnant and suffered another DVT in the left lower extremity, which she says traveled towards her heart.  After that, she was placed on Coumadin again, and then switched to Xarelto.  She had significant bleeding complications and menorrhagia with Xarelto, was switched to Eliquis.  Unfortunately, the patient is uninsured and so in the last few years her Eliquis use has been sporadic.  At times she has taken Eliquis from samples, or from her sister who is also on the same medication and dosage.  She says that several months ago, there was a period of time for about 2 months where she did not take any blood thinners.  Then, back in January of this year, she fell and broke her ankle after which she got back on Eliquis.  Patient states that in the last 24 hours, she noted some left ankle swelling and edema, denies any pain in the legs or in her calf.  She also noticed some slight discomfort in her chest with deep breaths, and that she was getting winded with activity.  Therefore she came to the ER for evaluation.  ED Course: Vital signs in the ER have been unremarkable except for some intermittent tachypnea.  She is stable on room air.  Lab work shows normal CBC and BMP, except for potassium of 2.9, and platelets  elevated at 500.  Review of Systems: Please see HPI for pertinent positives and negatives. A complete 10 system review of systems are otherwise negative.  Past Medical History:  Diagnosis Date   Chest pain    2/2 related to PE with component of anxiety versus pericarditis  Improves with tylenol prn a few times a week EKG with long QTc and nonspecific t wave changes. 2d echo 08/23/2013 - normal  08/26/2013 referred to cards for evaluation Lipid check ordered.    DVT (deep venous thrombosis) ([place])    "LLE" (07/04/2013)   Pulmonary embolism (HCC)    URI (upper respiratory infection) 05/07/2022   Past Surgical History:  Procedure Laterality Date   DIAGNOSTIC LAPAROSCOPY WITH REMOVAL OF ECTOPIC PREGNANCY N/A 11/16/2014   Procedure: DIAGNOSTIC LAPAROSCOPY WITH REMOVAL OF ECTOPIC PREGNANCY;  Surgeon: Jonnie Kind, MD;  Location: [facility];  Service: Gynecology;  Laterality: N/A;   NO PAST SURGERIES      Social History:  reports that she has never smoked. She has never used smokeless tobacco. She reports current alcohol use of about 3.0 standard drinks of alcohol per week. She reports that she does not use drugs.   Allergies  Allergen Reactions   Ibuprofen    Shellfish Allergy     Family History  Problem Relation Age of Onset   Pulmonary embolism Mother    Deep vein thrombosis Mother    Cancer  Mother    Hypertension Mother    Stroke Father    Gout Sister    Diabetes Sister    Hypertension Sister      Prior to Admission medications   Medication Sig Start Date End Date Taking? Authorizing Provider  acetaminophen (TYLENOL) 325 MG tablet Take 650 mg by mouth every 6 (six) hours as needed for mild pain or headache.     [provider]  apixaban (ELIQUIS) 5 MG TABS tablet Take 1 tablet (5 mg total) by mouth 2 (two) times daily. 05/05/22   Rick Duff, MD  benzonatate (TESSALON PERLES) 100 MG capsule Take 1 capsule (100 mg total) by mouth 3 (three) times daily as needed for  cough. 05/05/22   Rick Duff, MD  Cholecalciferol (D-5000) 125 MCG (5000 UT) TABS Take 1 tablet every day by mouth. 12/20/22     HYDROcodone-acetaminophen (NORCO/VICODIN) 5-325 MG tablet Take 1 tablet by mouth every 6 (six) hours as needed for 3 days 12/28/22     traMADol (ULTRAM) 50 MG tablet Take 1 tablet every 4 hours by mouth as needed. 12/20/22       Physical Exam: BP 139/77   Pulse 72   Temp 98.1 F (36.7 C) (Oral)   Resp 16   LMP 01/20/2023   SpO2 100%   General:  Alert, oriented, calm, in no acute distress, resting on stretcher in the ER on room air.  Family member at the bedside. Eyes: EOMI, clear conjuctivae, white sclerea Neck: supple, no masses, trachea mildline  Cardiovascular: RRR, no murmurs or rubs, 2+ pitting edema around the left ankle Respiratory: clear to auscultation bilaterally, no wheezes, no crackles, no tachypnea or evidence of respiratory distress Abdomen: soft, nontender, nondistended, normal bowel tones heard  Skin: dry, no rashes  Musculoskeletal: no joint effusions, normal range of motion  Psychiatric: appropriate affect, normal speech  Neurologic: extraocular muscles intact, clear speech, moving all extremities with intact sensorium          Labs on Admission:  Basic Metabolic Panel: Recent Labs  Lab 02/15/23 0023  NA 138  K 2.9*  CL 106  CO2 23  GLUCOSE 121*  BUN 10  CREATININE 0.79  CALCIUM 9.2   Liver Function Tests: No results for input(s): "AST", "ALT", "ALKPHOS", "BILITOT", "PROT", "ALBUMIN" in the last 168 hours. No results for input(s): "LIPASE", "AMYLASE" in the last 168 hours. No results for input(s): "AMMONIA" in the last 168 hours. CBC: Recent Labs  Lab 02/15/23 0023  WBC 7.4  HGB 12.2  HCT 37.0  MCV 87.3  PLT 500*   Cardiac Enzymes: No results for input(s): "CKTOTAL", "CKMB", "CKMBINDEX", "TROPONINI" in the last 168 hours.  BNP (last 3 results) No results for input(s): "BNP" in the last 8760 hours.  ProBNP  (last 3 results) No results for input(s): "PROBNP" in the last 8760 hours.  CBG: No results for input(s): "GLUCAP" in the last 168 hours.  Radiological Exams on Admission: CT Angio Chest PE W and/or Wo Contrast  Result Date: 02/15/2023 CLINICAL DATA:  Pulmonary embolism suspected, high probability. Shortness of breath for 1 day with ankle swelling. EXAM: CT ANGIOGRAPHY CHEST WITH CONTRAST TECHNIQUE: Multidetector CT imaging of the chest was performed using the standard protocol during bolus administration of intravenous contrast. Multiplanar CT image reconstructions and MIPs were obtained to evaluate the vascular anatomy. RADIATION DOSE REDUCTION: This exam was performed according to the departmental dose-optimization program which includes automated exposure control, adjustment of the mA and/or kV according to  patient size and/or use of iterative reconstruction technique. CONTRAST:  167mL OMNIPAQUE IOHEXOL 350 MG/ML SOLN COMPARISON:  06/15/2018. FINDINGS: Cardiovascular: The heart is normal in size and there is no pericardial effusion. The aorta and pulmonary trunk are normal in caliber. Small filling defects in the left lower lobe pulmonary arteries which may be acute or chronic. The right upper lobe pulmonary is diminutive and there is non-opacification of distal branches, suggesting pulmonary emboli. There is no evidence of right heart strain. Mediastinum/Nodes: No mediastinal or axillary lymphadenopathy by size criteria. Prominent hilar lymph nodes are present bilaterally. The thyroid gland, trachea, and esophagus are within normal limits. Lungs/Pleura: Bronchial wall thickening is noted bilaterally. There are mild scattered ground-glass opacities in the right upper lobe. No effusion or pneumothorax. Upper Abdomen: No acute abnormality. Musculoskeletal: No acute osseous abnormality. Review of the MIP images confirms the above findings. IMPRESSION: 1. Right upper lobe and left lower lobe pulmonary  emboli. No evidence of right heart strain. 2. A few ground-glass opacities in the right upper lobe, possible edema or infiltrate. Electronically Signed   By: Brett Fairy M.D.   On: 02/15/2023 05:05   DG Chest 2 View  Result Date: 02/15/2023 CLINICAL DATA:  Shortness of breath and lower extremity swelling. History of DVT and embolism. EXAM: CHEST - 2 VIEW COMPARISON:  06/15/2018. FINDINGS: The heart size and mediastinal contours are within normal limits. Peribronchial cuffing and perihilar interstitial thickening is noted bilaterally. No consolidation, effusion, or pneumothorax. No acute osseous abnormality. IMPRESSION: Peribronchial cuffing with perihilar interstitial thickening bilaterally, possible reactive airways disease versus bronchitis. Electronically Signed   By: Brett Fairy M.D.   On: 02/15/2023 00:38    Assessment/Plan Principal Problem:   Acute pulmonary embolism (HCC) Active Problems:   Obesity (BMI 30-39.9)   Noncompliance with medication regimen   Hypokalemia   Thrombocytosis  This is a pleasant 52 year old African-American female with a prior history of DVT/PE admitted to the hospital with recurrent PE despite Eliquis.  She has actually missed several doses intermittently, and was off of the medication at the end of 2023.  This is probably the cause of her suspected left lower extremity DVT, and PE.  Thankfully, she is hemodynamically stable and without other complication. -Admit for observation to hospitalist service -Continue IV heparin drip for now -Discussed with Dr. Lorenso Courier of hematology, who will see the patient in consultation -Obtain left lower extremity Doppler ultrasound to evaluate for DVT -Regular diet  DVT prophylaxis: Lovenox   Code Status:    Code Status: Full Code  Consults called: Dr. Lorenso Courier hematology  Admission status: Observation  Time spent: 48 minutes  Munir Victorian Neva Seat MD [facility] Pager [phone]  If 7PM-7AM, please contact  night-coverage [email or web address] Password [email or web address]  02/15/2023, 8:16 AM

## 2023-02-15 NOTE — Consult Note (Signed)
Hematology/Oncology Consult Note  Clinical Summary: Kara Mcclain is a 52 year old female with medical history significant for pulmonary emboli who presents with recurrent pulmonary emboli.  Reason for Consult: Recurrent pulmonary emboli, concern for possible Eliquis resistance.  HPI: [name] is a 52 year old female with medical history significant for recurrent pulmonary emboli, first noted in 07/04/2013.  At that time the patient had a CT PE study performed which showed extensive lower lobe pulmonary emboli bilaterally.  Again she was demonstrated to have PEs on 11/10/2014.  She underwent a repeat CT PE study on 06/15/2018 which showed large filling defect in the lower lobe branch of the left pulmonary artery consistent with a submassive pulmonary emboli.  At that time she was noted to have a subtherapeutic INR at 1.45.  She was transitioned to Eliquis therapy at that time.  Most recently today (02/15/2023) the patient underwent another CT PE study which showed right upper and lower lobe pulmonary emboli.  Due to concern for her history of recurrent PEs hematology was consulted for further evaluation and management.  On exam today Kara Mcclain reports she has been having difficulty faithfully taking her Eliquis therapy, though she has been faithfully taking the medication since February and March of this year.  In January she was taking the medication sporadically as she was running out and had difficulty affording it.  She notes that she first noted something was wrong when she was having swelling in her left lower extremity.  She does that she is having difficulty walking up steps and was having difficulty with fatigue and shortness of breath.  She notes that she had been previously tolerating her Eliquis therapy quite well with no nosebleeds, gum bleeding, or dark stools.  She been taking the medication twice daily as prescribed for the month of March.  Of note she did have an ankle injury in  her left ankle in February.  On further discussion the patient reports that she has strong family history of blood clots including saddle pulmonary emboli in her mother and pulmonary embolus in her sister.  Her mother also had breast and uterine cancer.  The patient is a never smoker and does not take any estrogen-based birth control.  She reports that she has previously been on Xarelto but had many problems with bleeding.  She tolerated Coumadin quite well and is unsure why she was switched off it in the past.  She also notes that she has never had any issues with bleeding, bruising, or dark stools.  A full 10 point ROS is otherwise negative.  O:  Vitals:   02/15/23 0745 02/15/23 0957  BP:  (!) 145/91  Pulse: 72 86  Resp: 16 20  Temp:  98.2 F (36.8 C)  SpO2: 100% 100%      Latest Ref Rng & Units 02/15/2023   12:23 AM 06/26/2019    2:58 PM 06/15/2018    3:06 PM  CMP  Glucose 70 - 99 mg/dL 121  83  108   BUN 6 - 20 mg/dL 10  10  8    Creatinine 0.44 - 1.00 mg/dL 0.79  0.64  0.78   Sodium 135 - 145 mmol/L 138  138  141   Potassium 3.5 - 5.1 mmol/L 2.9  4.4  4.0   Chloride 98 - 111 mmol/L 106  101  105   CO2 22 - 32 mmol/L 23  22  27    Calcium 8.9 - 10.3 mg/dL 9.2  9.4  9.3  Latest Ref Rng & Units 02/15/2023   12:23 AM 12/01/2020   11:52 AM 06/16/2018    2:56 AM  CBC  WBC 4.0 - 10.5 K/uL 7.4  5.9  9.8   Hemoglobin 12.0 - 15.0 g/dL 12.2  12.3  11.3   Hematocrit 36.0 - 46.0 % 37.0  37.8  34.9   Platelets 150 - 400 K/uL 500  403  281       GENERAL: well appearing middle-aged African-American female in NAD  SKIN: skin color, texture, turgor are normal, no rashes or significant lesions EYES: conjunctiva are pink and non-injected, sclera clear LUNGS: clear to auscultation and percussion with normal breathing effort HEART: regular rate & rhythm and no murmurs and no lower extremity edema Musculoskeletal: no cyanosis of digits and no clubbing  PSYCH: alert & oriented x 3, fluent  speech NEURO: no focal motor/sensory deficits  Assessment/Plan: [name] is a 52 year old female with medical history significant for pulmonary emboli who presents with recurrent pulmonary emboli.  # Recurrent Pulmonary Emboli  -- Patient had prior PEs in August 2014,  Dec 2015, July 2019, and PEs noted on CT scan today (02/15/2023). -- At this time findings are most consistent with recurrent VTE while on Eliquis therapy  -- Recommend discontinuation of Eliquis as she developed her most recent clots while faithfully taking the medication. Continue heparin drip. --recommend transitioning back to coumadin therapy. Patient notes she tolerated it well previously (she had bleeding issues on Xarelto).  --please assure patient is connected with her coumadin clinic prior to d/c. She will likely require a lovenox bridge upon d/c.  --prior hypercoagulation testing showed low cardiolipin antibodies and slightly elevated PTT Lupus anticoagulant (nr 28-43). All other labs normal (protein C, protein S, factor V Leiden mutation, prothrombin gene mutation and homocysteine level). These were performed while in acute VTE setting  --will plan for outpatient follow up with oncology.   Ledell Peoples, MD Department of Hematology/Oncology [place] [facility] Phone: [phone] Pager: [phone] Email: [email or web address]

## 2023-02-15 NOTE — ED Notes (Signed)
ED TO INPATIENT HANDOFF REPORT  Name/Age/Gender Kara Mcclain 52 y.o. female  Code Status    Code Status Orders  (From admission, onward)           Start     Ordered   02/15/23 0812  Full code  Continuous       Question:  By:  Answer:  Consent: discussion documented in EHR   02/15/23 0812           Code Status History     Date Active Date Inactive Code Status Order ID Comments User Context   06/15/2018 2119 06/16/2018 2143 Full Code [id number]  Ledell Noss, MD Inpatient   11/16/2014 2027 11/17/2014 1634 Full Code [id number]  Jonnie Kind, MD Inpatient   11/10/2014 2021 11/13/2014 1805 Full Code [id number]  Jessee Avers, MD Inpatient   07/04/2013 1329 07/05/2013 1947 Full Code [id number]  Hester Mates, MD Inpatient       Home/SNF/Other Home  Chief Complaint Acute pulmonary embolism ([place]) [I26.99]  Level of Care/Admitting Diagnosis ED Disposition     ED Disposition  Admit   Condition  --   Comment  Hospital Area: [facility] [[id number]]  Level of Care: Telemetry [5]  Admit to tele based on following criteria: Monitor for Ischemic changes  May place patient in observation at [facility] or [place] if equivalent level of care is available:: Yes  Covid Evaluation: Asymptomatic - no recent exposure (last 10 days) testing not required  Diagnosis: Acute pulmonary embolism [facility]) [id number]  Admitting Physician: Lucillie Garfinkel [id number]  Attending Physician: Shaugh[place]y.[place], MIR Kari.Conine [id number]          Medical History Past Medical History:  Diagnosis Date   Chest pain    2/2 related to PE with component of anxiety versus pericarditis  Improves with tylenol prn a few times a week EKG with long QTc and nonspecific t wave changes. 2d echo 08/23/2013 - normal  08/26/2013 referred to cards for evaluation Lipid check ordered.    DVT (deep venous thrombosis) ([place])    "LLE" (07/04/2013)   Pulmonary embolism (H[facility])    URI (upper respiratory infection)  05/07/2022    Allergies Allergies  Allergen Reactions   Ibuprofen    Shellfish Allergy     IV Location/Drains/Wounds Patient Lines/Drains/Airways Status     Active Line/Drains/Airways     Name Placement date Placement time Site Days   Peripheral IV 02/15/23 22 G Left;Posterior Hand 02/15/23  0010  Hand  less than 1   Peripheral IV 02/15/23 20 G 1" Right Antecubital 02/15/23  0406  Antecubital  less than 1   Incision - 3 Ports Abdomen 1: Right;Lateral 2: Lower;Mid 3: Umbilicus [id number]  0000000  -- 3013            Labs/Imaging Results for orders placed or performed during the hospital encounter of 02/15/23 (from the past 48 hour(s))  Basic metabolic panel     Status: Abnormal   Collection Time: 02/15/23 12:23 AM  Result Value Ref Range   Sodium 138 135 - 145 mmol/L   Potassium 2.9 (L) 3.5 - 5.1 mmol/L   Chloride 106 98 - 111 mmol/L   CO2 23 22 - 32 mmol/L   Glucose, Bld 121 (H) 70 - 99 mg/dL    Comment: Glucose reference range applies only to samples taken after fasting for at least 8 hours.   BUN 10 6 - 20 mg/dL   Creatinine, Ser 0.79 0.44 - 1.00  mg/dL   Calcium 9.2 8.9 - 10.3 mg/dL   GFR, Estimated >60 >60 mL/min    Comment: (NOTE) Calculated using the CKD-EPI Creatinine Equation (2021)    Anion gap 9 5 - 15    Comment: Performed at [facility], [place] [address] [place], [place] [address]  CBC     Status: Abnormal   Collection Time: 02/15/23 12:23 AM  Result Value Ref Range   WBC 7.4 4.0 - 10.5 K/uL   RBC 4.24 3.87 - 5.11 MIL/uL   Hemoglobin 12.2 12.0 - 15.0 g/dL   HCT 37.0 36.0 - 46.0 %   MCV 87.3 80.0 - 100.0 fL   MCH 28.8 26.0 - 34.0 pg   MCHC 33.0 30.0 - 36.0 g/dL   RDW 13.1 11.5 - 15.5 %   Platelets 500 (H) 150 - 400 K/uL   nRBC 0.0 0.0 - 0.2 %    Comment: Performed at [facility], [place] [address], [place], [place] [address]  Troponin I (High Sensitivity)     Status: None   Collection Time: 02/15/23 12:23 AM   Result Value Ref Range   Troponin I (High Sensitivity) 3 <18 ng/L    Comment: (NOTE) Elevated high sensitivity troponin I (hsTnI) values and significant  changes across serial measurements may suggest ACS but many other  chronic and acute conditions are known to elevate hsTnI results.  Refer to the "Links" section for chest pain algorithms and additional  guidance. Performed at [facility], [place] [address], [place], [place] [address]   Troponin I (High Sensitivity)     Status: None   Collection Time: 02/15/23  2:45 AM  Result Value Ref Range   Troponin I (High Sensitivity) 2 <18 ng/L    Comment: (NOTE) Elevated high sensitivity troponin I (hsTnI) values and significant  changes across serial measurements may suggest ACS but many other  chronic and acute conditions are known to elevate hsTnI results.  Refer to the "Links" section for chest pain algorithms and additional  guidance. Performed at [facility], [place] [address] [place], [place] [address]   Magnesium     Status: None   Collection Time: 02/15/23  2:45 AM  Result Value Ref Range   Magnesium 1.9 1.7 - 2.4 mg/dL    Comment: Performed at [facility], [place] [address], [place], [place] [address]  Heparin level (unfractionated)     Status: None   Collection Time: 02/15/23  6:08 AM  Result Value Ref Range   Heparin Unfractionated  0.30 - 0.70 IU/mL    QUESTIONABLE RESULTS, RECOMMEND RECOLLECT TO VERIFY    Comment: Performed at [facility], [place] [address] [place], [place] [address] CORRECTED ON 03/27 AT 0734: PREVIOUSLY REPORTED AS 0.14   Heparin level (unfractionated)     Status: None   Collection Time: 02/15/23  7:51 AM  Result Value Ref Range   Heparin Unfractionated 0.58 0.30 - 0.70 IU/mL    Comment: (NOTE) The clinical reportable range upper limit is being lowered to >1.10 to align with the FDA approved guidance for the current  laboratory assay.  If heparin results are below expected values, and patient dosage has  been confirmed, suggest follow up testing of antithrombin III levels. Performed at [facility], [place] [address], [place], [place] [address]   APTT     Status: Abnormal   Collection Time: 02/15/23  7:51 AM  Result Value Ref Range  aPTT 56 (H) 24 - 36 seconds    Comment:        IF BASELINE aPTT IS ELEVATED, SUGGEST PATIENT RISK ASSESSMENT BE USED TO DETERMINE APPROPRIATE ANTICOAGULANT THERAPY. Performed at [facility], [place] [address] [place], [place] [address]    CT Angio Chest PE W and/or Wo Contrast  Result Date: 02/15/2023 CLINICAL DATA:  Pulmonary embolism suspected, high probability. Shortness of breath for 1 day with ankle swelling. EXAM: CT ANGIOGRAPHY CHEST WITH CONTRAST TECHNIQUE: Multidetector CT imaging of the chest was performed using the standard protocol during bolus administration of intravenous contrast. Multiplanar CT image reconstructions and MIPs were obtained to evaluate the vascular anatomy. RADIATION DOSE REDUCTION: This exam was performed according to the departmental dose-optimization program which includes automated exposure control, adjustment of the mA and/or kV according to patient size and/or use of iterative reconstruction technique. CONTRAST:  131mL OMNIPAQUE IOHEXOL 350 MG/ML SOLN COMPARISON:  06/15/2018. FINDINGS: Cardiovascular: The heart is normal in size and there is no pericardial effusion. The aorta and pulmonary trunk are normal in caliber. Small filling defects in the left lower lobe pulmonary arteries which may be acute or chronic. The right upper lobe pulmonary is diminutive and there is non-opacification of distal branches, suggesting pulmonary emboli. There is no evidence of right heart strain. Mediastinum/Nodes: No mediastinal or axillary lymphadenopathy by size criteria. Prominent hilar lymph nodes are present bilaterally.  The thyroid gland, trachea, and esophagus are within normal limits. Lungs/Pleura: Bronchial wall thickening is noted bilaterally. There are mild scattered ground-glass opacities in the right upper lobe. No effusion or pneumothorax. Upper Abdomen: No acute abnormality. Musculoskeletal: No acute osseous abnormality. Review of the MIP images confirms the above findings. IMPRESSION: 1. Right upper lobe and left lower lobe pulmonary emboli. No evidence of right heart strain. 2. A few ground-glass opacities in the right upper lobe, possible edema or infiltrate. Electronically Signed   By: Brett Fairy M.D.   On: 02/15/2023 05:05   DG Chest 2 View  Result Date: 02/15/2023 CLINICAL DATA:  Shortness of breath and lower extremity swelling. History of DVT and embolism. EXAM: CHEST - 2 VIEW COMPARISON:  06/15/2018. FINDINGS: The heart size and mediastinal contours are within normal limits. Peribronchial cuffing and perihilar interstitial thickening is noted bilaterally. No consolidation, effusion, or pneumothorax. No acute osseous abnormality. IMPRESSION: Peribronchial cuffing with perihilar interstitial thickening bilaterally, possible reactive airways disease versus bronchitis. Electronically Signed   By: Brett Fairy M.D.   On: 02/15/2023 00:38    Pending Labs Unresulted Labs (From admission, onward)     Start     Ordered   02/16/23 0500  CBC  Daily,   R      02/15/23 0547   02/15/23 1200  APTT  Once-Timed,   STAT        02/15/23 0547   02/15/23 0811  HIV Antibody (routine testing w rflx)  (HIV Antibody (Routine testing w reflex) panel)  Once,   R        02/15/23 0812   02/15/23 0745  Heparin level  Once,   URGENT        02/15/23 0745            Vitals/Pain Today's Vitals   02/15/23 0700 02/15/23 0741 02/15/23 0745 02/15/23 0814  BP: 139/77     Pulse: 64  72   Resp: 19  16   Temp:  98.1 F (36.7 C)    TempSrc:  Oral    SpO2:  98%  100%   PainSc:    4     Isolation Precautions No active  isolations  Medications Medications  heparin bolus via infusion 4,000 Units (4,000 Units Intravenous Bolus from Bag 02/15/23 0613)    Followed by  heparin ADULT infusion 100 units/mL (25000 units/269mL) (1,700 Units/hr Intravenous New Bag/Given 02/15/23 [id number])  acetaminophen (TYLENOL) tablet 650 mg (650 mg Oral Given 02/15/23 0813)  traMADol (ULTRAM) tablet 50 mg (has no administration in time range)  benzonatate (TESSALON) capsule 100 mg (has no administration in time range)  traZODone (DESYREL) tablet 25 mg (has no administration in time range)  docusate sodium (COLACE) capsule 100 mg (has no administration in time range)  ondansetron (ZOFRAN) tablet 4 mg (has no administration in time range)    Or  ondansetron (ZOFRAN) injection 4 mg (has no administration in time range)  metoprolol tartrate (LOPRESSOR) injection 5 mg (has no administration in time range)  albuterol (PROVENTIL) (2.5 MG/3ML) 0.083% nebulizer solution 2.5 mg (has no administration in time range)  potassium chloride SA (KLOR-CON M) CR tablet 40 mEq (40 mEq Oral Given 02/15/23 0409)  potassium chloride 10 mEq in 100 mL IVPB (0 mEq Intravenous Stopped 02/15/23 0615)  magnesium sulfate IVPB 2 g 50 mL (0 g Intravenous Stopped 02/15/23 0615)  iohexol (OMNIPAQUE) 350 MG/ML injection 100 mL (100 mLs Intravenous Contrast Given 02/15/23 0433)  sodium chloride (PF) 0.9 % injection (  Given by Other 02/15/23 0809)  potassium chloride SA (KLOR-CON M) CR tablet 40 mEq (40 mEq Oral Given 02/15/23 0813)    Mobility walks

## 2023-02-15 NOTE — Progress Notes (Signed)
ANTICOAGULATION CONSULT NOTE -   Consult  Pharmacy Consult for Heparin Indication: pulmonary embolus  Allergies  Allergen Reactions   Ibuprofen Swelling   Shellfish Allergy Swelling    Patient Measurements: Height: 5\' 11"  (180.3 cm) Weight: 119.9 kg (264 lb 4.8 oz) IBW/kg (Calculated) : 70.8 Heparin Dosing Weight: 98.5 kg  Weight (11/2022) = 123.3 kg Height = 71"  Vital Signs: Temp: 98.2 F (36.8 C) (03/27 0957) Temp Source: Oral (03/27 0957) BP: 145/91 (03/27 0957) Pulse Rate: 86 (03/27 0957)  Labs: Recent Labs    02/15/23 0023 02/15/23 0245 02/15/23 [id number] 02/15/23 0751 02/15/23 1007 02/15/23 1211  HGB 12.2  --   --   --   --   --   HCT 37.0  --   --   --   --   --   PLT 500*  --   --   --   --   --   APTT  --   --   --  56*  --  48*  HEPARINUNFRC  --   --  QUESTIONABLE RESULTS, RECOMMEND RECOLLECT TO VERIFY 0.58 0.59  --   CREATININE 0.79  --   --   --   --   --   TROPONINIHS 3 2  --   --   --   --      Estimated Creatinine Clearance: 121.4 mL/min (by C-G formula based on SCr of 0.79 mg/dL).   Medical History: Past Medical History:  Diagnosis Date   Chest pain    2/2 related to PE with component of anxiety versus pericarditis  Improves with tylenol prn a few times a week EKG with long QTc and nonspecific t wave changes. 2d echo 08/23/2013 - normal  08/26/2013 referred to cards for evaluation Lipid check ordered.    DVT (deep venous thrombosis) ([place])    "LLE" (07/04/2013)   Pulmonary embolism (HCC)    URI (upper respiratory infection) 05/07/2022    Medications:  PTA Eliquis 5mg  po BID - patient reports last dose taken 02/14/23 @ 0700  (noted medication restarted a month ago)  Assessment: 52 yr female with h/o DVT/PE presents with shortness of breath.  S/p ankle fracture in February and noted recent increased swelling CTAngio = + right upper lobe and left lower lobe PE.  No evidence of right heart strain Due to recent apixaban use, anticipate heparin levels  to be falsely elevated.  So will monitor heparin therapy with aPTT until effects of apixaban have worn off and aPTT and heparin levels correlate.  Today, 02/15/23 Hgb 12.2, plt 500  Scr 0.79 mg/dl, CrCl 121 ml/min HL is 0.59, therapeutic. aPTT is 48 sec. Pt has been taken sporadically due to affordability issues.    Goal of Therapy:  Heparin level 0.3-0.7 units/ml aPTT 66-102 seconds Monitor platelets by anticoagulation protocol: Yes   Plan:    Continue heparin at  1700 units/hr Check aPTT/HL 6 hr after heparin gtt started Likely will be able to use HL if correlate Daily heparin level and CBC   Royetta Asal, PharmD, BCPS 02/15/2023 1:24 PM

## 2023-02-16 ENCOUNTER — Other Ambulatory Visit (HOSPITAL_COMMUNITY): Payer: Self-pay

## 2023-02-16 LAB — BASIC METABOLIC PANEL
Anion gap: 8 (ref 5–15)
BUN: 10 mg/dL (ref 6–20)
CO2: 22 mmol/L (ref 22–32)
Calcium: 8.6 mg/dL — ABNORMAL LOW (ref 8.9–10.3)
Chloride: 107 mmol/L (ref 98–111)
Creatinine, Ser: 0.66 mg/dL (ref 0.44–1.00)
GFR, Estimated: >60 mL/min (ref >60)
Glucose, Bld: 120 mg/dL — ABNORMAL HIGH (ref 70–99)
Potassium: 3.6 mmol/L (ref 3.5–5.1)
Sodium: 137 mmol/L (ref 135–145)

## 2023-02-16 LAB — CBC
HCT: 34.5 % — ABNORMAL LOW (ref 36.0–46.0)
Hemoglobin: 11.1 g/dL — ABNORMAL LOW (ref 12.0–15.0)
MCH: 28.6 pg (ref 26.0–34.0)
MCHC: 32.2 g/dL (ref 30.0–36.0)
MCV: 88.9 fL (ref 80.0–100.0)
Platelets: 455 10*3/uL — ABNORMAL HIGH (ref 150–400)
RBC: 3.88 MIL/uL (ref 3.87–5.11)
RDW: 13.4 % (ref 11.5–15.5)
WBC: 6.3 10*3/uL (ref 4.0–10.5)
nRBC: 0 % (ref 0.0–0.2)

## 2023-02-16 LAB — PROTIME-INR
INR: 1.1 (ref 0.8–1.2)
Prothrombin Time: 14.1 seconds (ref 11.4–15.2)

## 2023-02-16 LAB — HEPARIN LEVEL (UNFRACTIONATED): Heparin Unfractionated: 0.22 IU/mL — ABNORMAL LOW (ref 0.30–0.70)

## 2023-02-16 MED ORDER — TRAMADOL HCL 50 MG PO TABS
50.0000 mg | ORAL_TABLET | Freq: Every day | ORAL | 0 refills | Status: DC
  Filled 2023-02-16: qty 15, 3d supply, fill #0

## 2023-02-16 MED ORDER — WARFARIN - PHARMACIST DOSING INPATIENT: Freq: Every day | Status: DC

## 2023-02-16 MED ORDER — POTASSIUM CHLORIDE CRYS ER 20 MEQ PO TBCR
40.0000 meq | EXTENDED_RELEASE_TABLET | Freq: Once | ORAL | Status: AC
  Administered 2023-02-16: 40 meq via ORAL
  Filled 2023-02-16: qty 2

## 2023-02-16 MED ORDER — WARFARIN SODIUM 5 MG PO TABS: 10.0000 mg | ORAL_TABLET | Freq: Once | ORAL | Status: DC

## 2023-02-16 MED ORDER — ENOXAPARIN SODIUM 120 MG/0.8ML IJ SOSY
120.0000 mg | PREFILLED_SYRINGE | Freq: Two times a day (BID) | INTRAMUSCULAR | 0 refills | Status: DC
  Filled 2023-02-16 (×2): qty 11.2, 7d supply, fill #0

## 2023-02-16 MED ORDER — WARFARIN SODIUM 5 MG PO TABS
10.0000 mg | ORAL_TABLET | Freq: Once | ORAL | Status: AC
  Administered 2023-02-16: 10 mg via ORAL
  Filled 2023-02-16: qty 2

## 2023-02-16 MED ORDER — ENOXAPARIN SODIUM 120 MG/0.8ML IJ SOSY
120.0000 mg | PREFILLED_SYRINGE | Freq: Two times a day (BID) | INTRAMUSCULAR | Status: DC
  Administered 2023-02-16: 120 mg via SUBCUTANEOUS
  Filled 2023-02-16: qty 0.8

## 2023-02-16 MED ORDER — WARFARIN SODIUM 7.5 MG PO TABS
7.5000 mg | ORAL_TABLET | Freq: Every day | ORAL | 0 refills | Status: DC
  Filled 2023-02-16: qty 30, 30d supply, fill #0

## 2023-02-16 NOTE — Discharge Instructions (Addendum)
Follow with Gaylan Gerold, DO in 5-7 days  Please get a complete blood count and chemistry panel checked by your Primary MD at your next visit, and again as instructed by your Primary MD. Please get your medications reviewed and adjusted by your Primary MD.  Please request your Primary MD to go over all Hospital Tests and Procedure/Radiological results at the follow up, please get all Hospital records sent to your Prim MD by signing hospital release before you go home.  In some cases, there will be blood work, cultures and biopsy results pending at the time of your discharge. Please request that your primary care M.D. goes through all the records of your hospital data and follows up on these results.  If you had Pneumonia of Lung problems at the Hospital: Please get a 2 view Chest X ray done in 6-8 weeks after hospital discharge or sooner if instructed by your Primary MD.  If you have Congestive Heart Failure: Please call your Cardiologist or Primary MD anytime you have any of the following symptoms:  1) 3 pound weight gain in 24 hours or 5 pounds in 1 week  2) shortness of breath, with or without a dry hacking cough  3) swelling in the hands, feet or stomach  4) if you have to sleep on extra pillows at night in order to breathe  Follow cardiac low salt diet and 1.5 lit/day fluid restriction.  If you have diabetes Accuchecks 4 times/day, Once in AM empty stomach and then before each meal. Log in all results and show them to your primary doctor at your next visit. If any glucose reading is under 80 or above 300 call your primary MD immediately.  If you have Seizure/Convulsions/Epilepsy: Please do not drive, operate heavy machinery, participate in activities at heights or participate in high speed sports until you have seen by Primary MD or a Neurologist and advised to do so again. Per [facility] statutes, patients with seizures are not allowed to drive until they have been  seizure-free for six months.  Use caution when using heavy equipment or power tools. Avoid working on ladders or at heights. Take showers instead of baths. Ensure the water temperature is not too high on the home water heater. Do not go swimming alone. Do not lock yourself in a room alone (i.e. bathroom). When caring for infants or small children, sit down when holding, feeding, or changing them to minimize risk of injury to the child in the event you have a seizure. Maintain good sleep hygiene. Avoid alcohol.   If you had Gastrointestinal Bleeding: Please ask your Primary MD to check a complete blood count within one week of discharge or at your next visit. Your endoscopic/colonoscopic biopsies that are pending at the time of discharge, will also need to followed by your Primary MD.  Get Medicines reviewed and adjusted. Please take all your medications with you for your next visit with your Primary MD  Please request your Primary MD to go over all hospital tests and procedure/radiological results at the follow up, please ask your Primary MD to get all Hospital records sent to his/her office.  If you experience worsening of your admission symptoms, develop shortness of breath, life threatening emergency, suicidal or homicidal thoughts you must seek medical attention immediately by calling 911 or calling your MD immediately  if symptoms less severe.  You must read complete instructions/literature along with all the possible adverse reactions/side effects for all the Medicines you take  and that have been prescribed to you. Take any new Medicines after you have completely understood and accpet all the possible adverse reactions/side effects.   Do not drive or operate heavy machinery when taking Pain medications.   Do not take more than prescribed Pain, Sleep and Anxiety Medications  Special Instructions: If you have smoked or chewed Tobacco  in the last 2 yrs please stop smoking, stop any regular  Alcohol  and or any Recreational drug use.  Wear Seat belts while driving.  Please note You were cared for by a hospitalist during your hospital stay. If you have any questions about your discharge medications or the care you received while you were in the hospital after you are discharged, you can call the unit and asked to speak with the hospitalist on call if the hospitalist that took care of you is not available. Once you are discharged, your primary care physician will handle any further medical issues. Please note that NO REFILLS for any discharge medications will be authorized once you are discharged, as it is imperative that you return to your primary care physician (or establish a relationship with a primary care physician if you do not have one) for your aftercare needs so that they can reassess your need for medications and monitor your lab values.  You can reach the hospitalist office at phone [phone] or fax [phone]   If you do not have a primary care physician, you can call [phone] for a physician referral.  Activity: As tolerated with Full fall precautions use walker/cane & assistance as needed    Diet: regular  Disposition Home   Information on my medicine - Coumadin   (Warfarin)  This medication education was reviewed with me or my healthcare representative as part of my discharge preparation.   Why was Coumadin prescribed for you? Coumadin was prescribed for you because you have a blood clot or a medical condition that can cause an increased risk of forming blood clots. Blood clots can cause serious health problems by blocking the flow of blood to the heart, lung, or brain. Coumadin can prevent harmful blood clots from forming. As a reminder your indication for Coumadin is:  Pulmonary Embolism treatment  What test will check on my response to Coumadin? While on Coumadin (warfarin) you will need to have an INR test regularly to ensure that your dose is keeping  you in the desired range. The INR (international normalized ratio) number is calculated from the result of the laboratory test called prothrombin time (PT).  If an INR APPOINTMENT HAS NOT ALREADY BEEN MADE FOR YOU please schedule an appointment to have this lab work done by your health care provider within 7 days. Your INR goal is usually a number between:  2 to 3 or your provider may give you a more narrow range like 2-2.5.  Ask your health care provider during an office visit what your goal INR is.  What  do you need to  know  About  COUMADIN? Take Coumadin (warfarin) exactly as prescribed by your healthcare provider about the same time each day.  DO NOT stop taking without talking to the doctor who prescribed the medication.  Stopping without other blood clot prevention medication to take the place of Coumadin may increase your risk of developing a new clot or stroke.  Get refills before you run out.  What do you do if you miss a dose? If you miss a dose, take it as soon as you  remember on the same day then continue your regularly scheduled regimen the next day.  Do not take two doses of Coumadin at the same time.  Important Safety Information A possible side effect of Coumadin (Warfarin) is an increased risk of bleeding. You should call your healthcare provider right away if you experience any of the following: Bleeding from an injury or your nose that does not stop. Unusual colored urine (red or dark brown) or unusual colored stools (red or black). Unusual bruising for unknown reasons. A serious fall or if you hit your head (even if there is no bleeding).  Some foods or medicines interact with Coumadin (warfarin) and might alter your response to warfarin. To help avoid this: Eat a balanced diet, maintaining a consistent amount of Vitamin K. Notify your provider about major diet changes you plan to make. Avoid alcohol or limit your intake to 1 drink for women and 2 drinks for men per  day. (1 drink is 5 oz. wine, 12 oz. beer, or 1.5 oz. liquor.)  Make sure that ANY health care provider who prescribes medication for you knows that you are taking Coumadin (warfarin).  Also make sure the healthcare provider who is monitoring your Coumadin knows when you have started a new medication including herbals and non-prescription products.  Coumadin (Warfarin)  Major Drug Interactions  Increased Warfarin Effect Decreased Warfarin Effect  Alcohol (large quantities) Antibiotics (esp. Septra/Bactrim, Flagyl, Cipro) Amiodarone (Cordarone) Aspirin (ASA) Cimetidine (Tagamet) Megestrol (Megace) NSAIDs (ibuprofen, naproxen, etc.) Piroxicam (Feldene) Propafenone (Rythmol SR) Propranolol (Inderal) Isoniazid (INH) Posaconazole (Noxafil) Barbiturates (Phenobarbital) Carbamazepine (Tegretol) Chlordiazepoxide (Librium) Cholestyramine (Questran) Griseofulvin Oral Contraceptives Rifampin Sucralfate (Carafate) Vitamin K   Coumadin (Warfarin) Major Herbal Interactions  Increased Warfarin Effect Decreased Warfarin Effect  Garlic Ginseng Ginkgo biloba Coenzyme Q10 Green tea St. John's wort    Coumadin (Warfarin) FOOD Interactions  Eat a consistent number of servings per week of foods HIGH in Vitamin K (1 serving =  cup)  Collards (cooked, or boiled & drained) Kale (cooked, or boiled & drained) Mustard greens (cooked, or boiled & drained) Parsley *serving size only =  cup Spinach (cooked, or boiled & drained) Swiss chard (cooked, or boiled & drained) Turnip greens (cooked, or boiled & drained)  Eat a consistent number of servings per week of foods MEDIUM-HIGH in Vitamin K (1 serving = 1 cup)  Asparagus (cooked, or boiled & drained) Broccoli (cooked, boiled & drained, or raw & chopped) Brussel sprouts (cooked, or boiled & drained) *serving size only =  cup Lettuce, raw (green leaf, endive, romaine) Spinach, raw Turnip greens, raw & chopped   These websites have more  information on Coumadin (warfarin):  FailFactory.se; VeganReport.com.au;

## 2023-02-16 NOTE — Discharge Summary (Addendum)
Physician Discharge Summary  Kara Mcclain [id number] DOB: [birth year] DOA: 02/15/2023  PCP: Gaylan Gerold, DO  Admit date: 02/15/2023 Discharge date: 02/16/2023  Admitted From: home Disposition:  home  Recommendations for Outpatient Follow-up:  Follow up with PCP / Coumadin clinic in 1 week -- patient already called for an appointment  Home Health: none Equipment/Devices: none  Discharge Condition: stable CODE STATUS: Full code Diet Orders (From admission, onward)     Start     Ordered   02/15/23 0812  Diet regular Room service appropriate? Yes; Fluid consistency: Thin  Diet effective now       Question Answer Comment  Room service appropriate? Yes   Fluid consistency: Thin      02/15/23 0812            HPI: Per admitting MD, Kara Mcclain is a 52 y.o. 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  The patient has a long history of venous thromboembolism, starting in about 2014 when she had a left lower extremity DVT which the patient says was unprovoked.  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  She was initially started on Coumadin, and took this for a year and tolerated well.  Medication was then discontinued, after which she got pregnant and suffered another DVT in the left lower extremity, which she says traveled towards her heart.  After that, she was placed on Coumadin again, and then switched to Xarelto.  She had significant bleeding complications and menorrhagia with Xarelto, was switched to Eliquis.  Unfortunately, the patient is uninsured and so in the last few years her Eliquis use has been sporadic.  At times she has taken Eliquis from samples, or from her sister who is also on the same medication and dosage.  She says that several months ago, there was a period of time for about 2 months where she did not take any blood thinners.   Then, back in January of this year, she fell and broke her ankle after which she got back on Eliquis. Patient states that in the last 24 hours, she noted some left ankle swelling and edema, denies any pain in the legs or in her calf.  She also noticed some slight discomfort in her chest with deep breaths, and that she was getting winded with activity.  Therefore she came to the ER for evaluation.  Hospital Course / Discharge diagnoses: Principal Problem:   Acute pulmonary embolism (HCC) Active Problems:   Obesity (BMI 30-39.9)   Noncompliance with medication regimen   Hypokalemia   Thrombocytosis  Principal problem Recurrent DVT/PE -patient was admitted to the hospital and diagnosed with right upper lobe and left lower lobe pulmonary emboli, recurrent.  There was no evidence of right heart strain.  Lower extremity Dopplers showed age-indeterminate DVT in the left posterior tibial veins and superficial vein thrombosis in the left superficial veins/varicosities.  She was placed on heparin, tolerated that well without issues.  Hematology consulted and evaluated patient while hospitalized, feeling like this was Eliquis failure.  Given that she had excessive bleeding while on Xarelto, and did well on Coumadin in the past, hematology recommends that she be placed back on Coumadin.  She will be bridged with Lovenox, and patient has been doing this in the past and is familiar how to self administer the Lovenox injections.  She  is better, her breathing has improved and is back to baseline, she is ambulating without difficulties, and will be discharged home in stable condition.  She has already contacted the Coumadin clinic and I have also conacted her PCP and she will have follow-up next week for INR check.  Hematology also plans for outpatient follow-up  Active problems Obesity, class II-BMI 36, she would benefit from weight loss Tobacco use, in remission-quit 2 years ago. Hypokalemia-replaced  Sepsis  ruled out   Discharge Instructions   Allergies as of 02/16/2023       Reactions   Ibuprofen Swelling   Shellfish Allergy Swelling        Medication List     STOP taking these medications    apixaban 5 MG Tabs tablet Commonly known as: Eliquis   HYDROcodone-acetaminophen 5-325 MG tablet Commonly known as: NORCO/VICODIN       TAKE these medications    acetaminophen 500 MG tablet Commonly known as: TYLENOL Take 1,000 mg by mouth as needed.   benzonatate 100 MG capsule Commonly known as: Tessalon Perles Take 1 capsule (100 mg total) by mouth 3 (three) times daily as needed for cough.   D-5000 125 MCG (5000 UT) Tabs Generic drug: Cholecalciferol Take 1 tablet every day by mouth.   enoxaparin 120 MG/0.8ML injection Commonly known as: LOVENOX Inject 0.8 mLs (120 mg total) into the skin every 12 (twelve) hours for 7 days.   traMADol 50 MG tablet Commonly known as: ULTRAM Take 1 tablet every 4 hours by mouth as needed.   VITAMIN D PO Take 1 capsule by mouth daily.   warfarin 7.5 MG tablet Commonly known as: Coumadin Take 1 tablet (7.5 mg total) by mouth daily.       Consultations: Hematology   Procedures/Studies:  VAS [place] LOWER EXTREMITY VENOUS (DVT)  Result Date: 02/15/2023  Lower Venous DVT Study Patient Name:  Kara Mcclain  Date of Exam:   02/15/2023 Medical Rec #: [id number]        Accession #:    [id number] Date of Birth: 1970/12/22        Patient Gender: F Patient Age:   77 years Exam Location:  [facility] Procedure:      VAS [place] LOWER EXTREMITY VENOUS (DVT) Referring Phys: MIR [facility] --------------------------------------------------------------------------------  Indications: Pulmonary embolism. Other Indications: History of PE and DVT. Risk Factors: Trauma - Left ankle fracture 12-15-2022. Limitations: Skin changes and edema- distal left calf. Comparison Study: 02-07-2018 Prior right lower extremity venous study was                    negative for DVT. Performing Technologist: Darlin Coco RDMS, RVT  Examination Guidelines: A complete evaluation includes B-mode imaging, spectral Doppler, color Doppler, and power Doppler as needed of all accessible portions of each vessel. Bilateral testing is considered an integral part of a complete examination. Limited examinations for reoccurring indications may be performed as noted. The reflux portion of the exam is performed with the patient in reverse Trendelenburg.  +-----+---------------+---------+-----------+----------+--------------+ RIGHTCompressibilityPhasicitySpontaneityPropertiesThrombus Aging +-----+---------------+---------+-----------+----------+--------------+ CFV  Full           Yes      Yes                                 +-----+---------------+---------+-----------+----------+--------------+   +---------+---------------+---------+-----------+----------+-----------------+ LEFT     CompressibilityPhasicitySpontaneityPropertiesThrombus Aging    +---------+---------------+---------+-----------+----------+-----------------+ CFV      Full  Yes      Yes                                    +---------+---------------+---------+-----------+----------+-----------------+ SFJ      Full                                                           +---------+---------------+---------+-----------+----------+-----------------+ FV Prox  Full                                                           +---------+---------------+---------+-----------+----------+-----------------+ FV Mid   Full                                                           +---------+---------------+---------+-----------+----------+-----------------+ FV DistalFull                                                           +---------+---------------+---------+-----------+----------+-----------------+ PFV      Full                                                            +---------+---------------+---------+-----------+----------+-----------------+ POP      Partial        Yes      Yes                  Chronic           +---------+---------------+---------+-----------+----------+-----------------+ PTV      Partial        Yes      Yes                  Age Indeterminate +---------+---------------+---------+-----------+----------+-----------------+ PERO                    Yes      Yes                  Patent by color   +---------+---------------+---------+-----------+----------+-----------------+ VV       Partial        Yes      Yes                  Age Indeterminate +---------+---------------+---------+-----------+----------+-----------------+     Summary: RIGHT: - No evidence of common femoral vein obstruction.  LEFT: - Findings consistent with age indeterminate deep vein thrombosis involving the left posterior tibial veins. - Findings consistent with age indeterminate superficial vein thrombosis involving the left superficial veins/varicosities. - A cystic structure is found in the  popliteal fossa. 3.3 x 0.7 cm.  *See table(s) above for measurements and observations. Electronically signed by Deitra Mayo MD on 02/15/2023 at 2:26:20 PM.    Final    CT Angio Chest PE W and/or Wo Contrast  Result Date: 02/15/2023 CLINICAL DATA:  Pulmonary embolism suspected, high probability. Shortness of breath for 1 day with ankle swelling. EXAM: CT ANGIOGRAPHY CHEST WITH CONTRAST TECHNIQUE: Multidetector CT imaging of the chest was performed using the standard protocol during bolus administration of intravenous contrast. Multiplanar CT image reconstructions and MIPs were obtained to evaluate the vascular anatomy. RADIATION DOSE REDUCTION: This exam was performed according to the departmental dose-optimization program which includes automated exposure control, adjustment of the mA and/or kV according to patient size and/or use of iterative reconstruction  technique. CONTRAST:  181mL OMNIPAQUE IOHEXOL 350 MG/ML SOLN COMPARISON:  06/15/2018. FINDINGS: Cardiovascular: The heart is normal in size and there is no pericardial effusion. The aorta and pulmonary trunk are normal in caliber. Small filling defects in the left lower lobe pulmonary arteries which may be acute or chronic. The right upper lobe pulmonary is diminutive and there is non-opacification of distal branches, suggesting pulmonary emboli. There is no evidence of right heart strain. Mediastinum/Nodes: No mediastinal or axillary lymphadenopathy by size criteria. Prominent hilar lymph nodes are present bilaterally. The thyroid gland, trachea, and esophagus are within normal limits. Lungs/Pleura: Bronchial wall thickening is noted bilaterally. There are mild scattered ground-glass opacities in the right upper lobe. No effusion or pneumothorax. Upper Abdomen: No acute abnormality. Musculoskeletal: No acute osseous abnormality. Review of the MIP images confirms the above findings. IMPRESSION: 1. Right upper lobe and left lower lobe pulmonary emboli. No evidence of right heart strain. 2. A few ground-glass opacities in the right upper lobe, possible edema or infiltrate. Electronically Signed   By: Brett Fairy M.D.   On: 02/15/2023 05:05   DG Chest 2 View  Result Date: 02/15/2023 CLINICAL DATA:  Shortness of breath and lower extremity swelling. History of DVT and embolism. EXAM: CHEST - 2 VIEW COMPARISON:  06/15/2018. FINDINGS: The heart size and mediastinal contours are within normal limits. Peribronchial cuffing and perihilar interstitial thickening is noted bilaterally. No consolidation, effusion, or pneumothorax. No acute osseous abnormality. IMPRESSION: Peribronchial cuffing with perihilar interstitial thickening bilaterally, possible reactive airways disease versus bronchitis. Electronically Signed   By: Brett Fairy M.D.   On: 02/15/2023 00:38     Subjective: - no chest pain, shortness of breath,  no abdominal pain, nausea or vomiting.   Discharge Exam: BP 137/82 (BP Location: Left Arm)   Pulse 86   Temp 98.4 F (36.9 C) (Oral)   Resp 18   Ht 5\' 11"  (1.803 m)   Wt 119.9 kg   LMP 01/20/2023   SpO2 100%   BMI 36.86 kg/m   General: Pt is alert, awake, not in acute distress Cardiovascular: RRR, S1/S2 +, no rubs, no gallops Respiratory: CTA bilaterally, no wheezing, no rhonchi Abdominal: Soft, NT, ND, bowel sounds + Extremities: no edema, no cyanosis    The results of significant diagnostics from this hospitalization (including imaging, microbiology, ancillary and laboratory) are listed below for reference.     Microbiology: No results found for this or any previous visit (from the past 240 hour(s)).   Labs: Basic Metabolic Panel: Recent Labs  Lab 02/15/23 0023 02/15/23 0245 02/16/23 0524  NA 138  --  137  K 2.9*  --  3.6  CL 106  --  107  CO2 23  --  22  GLUCOSE 121*  --  120*  BUN 10  --  10  CREATININE 0.79  --  0.66  CALCIUM 9.2  --  8.6*  MG  --  1.9  --    Liver Function Tests: No results for input(s): "AST", "ALT", "ALKPHOS", "BILITOT", "PROT", "ALBUMIN" in the last 168 hours. CBC: Recent Labs  Lab 02/15/23 0023 02/16/23 0524  WBC 7.4 6.3  HGB 12.2 11.1*  HCT 37.0 34.5*  MCV 87.3 88.9  PLT 500* 455*   CBG: No results for input(s): "GLUCAP" in the last 168 hours. Hgb A1c No results for input(s): "HGBA1C" in the last 72 hours. Lipid Profile No results for input(s): "CHOL", "HDL", "LDLCALC", "TRIG", "CHOLHDL", "LDLDIRECT" in the last 72 hours. Thyroid function studies No results for input(s): "TSH", "T4TOTAL", "T3FREE", "THYROIDAB" in the last 72 hours.  Invalid input(s): "FREET3" Urinalysis    Component Value Date/Time   COLORURINE YELLOW 11/10/2014 [place] 11/10/2014 1520   LABSPEC 1.028 11/10/2014 1520   PHURINE 6.5 11/10/2014 1520   GLUCOSEU NEGATIVE 11/10/2014 1520   HGBUR NEGATIVE 11/10/2014 [place] 11/10/2014 1520   KETONESUR 15 (A) 11/10/2014 1520   PROTEINUR NEGATIVE 11/10/2014 1520   UROBILINOGEN 1.0 11/10/2014 1520   NITRITE NEGATIVE 11/10/2014 1520   LEUKOCYTESUR NEGATIVE 11/10/2014 1520    FURTHER DISCHARGE INSTRUCTIONS:   Get Medicines reviewed and adjusted: Please take all your medications with you for your next visit with your Primary MD   Laboratory/radiological data: Please request your Primary MD to go over all hospital tests and procedure/radiological results at the follow up, please ask your Primary MD to get all Hospital records sent to his/her office.   In some cases, they will be blood work, cultures and biopsy results pending at the time of your discharge. Please request that your primary care M.D. goes through all the records of your hospital data and follows up on these results.   Also Note the following: If you experience worsening of your admission symptoms, develop shortness of breath, life threatening emergency, suicidal or homicidal thoughts you must seek medical attention immediately by calling 911 or calling your MD immediately  if symptoms less severe.   You must read complete instructions/literature along with all the possible adverse reactions/side effects for all the Medicines you take and that have been prescribed to you. Take any new Medicines after you have completely understood and accpet all the possible adverse reactions/side effects.    Do not drive when taking Pain medications or sleeping medications (Benzodaizepines)   Do not take more than prescribed Pain, Sleep and Anxiety Medications. It is not advisable to combine anxiety,sleep and pain medications without talking with your primary care practitioner   Special Instructions: If you have smoked or chewed Tobacco  in the last 2 yrs please stop smoking, stop any regular Alcohol  and or any Recreational drug use.   Wear Seat belts while driving.   Please note: You were cared for by  a hospitalist during your hospital stay. Once you are discharged, your primary care physician will handle any further medical issues. Please note that NO REFILLS for any discharge medications will be authorized once you are discharged, as it is imperative that you return to your primary care physician (or establish a relationship with a primary care physician if you do not have one) for your post hospital discharge needs so that they can reassess your need for medications and monitor your lab  values.  Time coordinating discharge: 35 minutes  SIGNED:  Marzetta Board, MD, PhD 02/16/2023, 9:58 AM

## 2023-02-16 NOTE — Progress Notes (Signed)
ANTICOAGULATION CONSULT NOTE -  Follow Up  Pharmacy consult for Heparin > Enoxaparin/Warfarin Indication: pulmonary embolus  Allergies  Allergen Reactions   Ibuprofen Swelling   Shellfish Allergy Swelling    Patient Measurements: Height: 5\' 11"  (180.3 cm) Weight: 119.9 kg (264 lb 4.8 oz) IBW/kg (Calculated) : 70.8 Heparin Dosing Weight: 98 kg  Vital Signs: Temp: 98.4 F (36.9 C) (03/28 0529) Temp Source: Oral (03/28 0529) BP: 137/82 (03/28 0529) Pulse Rate: 86 (03/28 0529)  Labs: Recent Labs    02/15/23 0023 02/15/23 0245 02/15/23 [id number] 02/15/23 0751 02/15/23 1007 02/15/23 1211 02/15/23 1645 02/16/23 0524  HGB 12.2  --   --   --   --   --   --  11.1*  HCT 37.0  --   --   --   --   --   --  34.5*  PLT 500*  --   --   --   --   --   --  455*  APTT  --   --   --  56*  --  48* 52*  --   HEPARINUNFRC  --   --    < > 0.58 0.59  --  0.44 0.22*  CREATININE 0.79  --   --   --   --   --   --  0.66  TROPONINIHS 3 2  --   --   --   --   --   --    < > = values in this interval not displayed.     Estimated Creatinine Clearance: 121.4 mL/min (by C-G formula based on SCr of 0.66 mg/dL).   Medical History: Past Medical History:  Diagnosis Date   Chest pain    2/2 related to PE with component of anxiety versus pericarditis  Improves with tylenol prn a few times a week EKG with long QTc and nonspecific t wave changes. 2d echo 08/23/2013 - normal  08/26/2013 referred to cards for evaluation Lipid check ordered.    DVT (deep venous thrombosis) ([place])    "LLE" (07/04/2013)   Pulmonary embolism (HCC)    URI (upper respiratory infection) 05/07/2022    Medications:  PTA Eliquis 5 mg PO BID - patient reports last dose taken 02/14/23 @ 0700  (noted medication restarted recently).  Assessment: Pt is a 28 yoF with history of multiple PE/DVT since 2014. Pt initially anticoagulated with warfarin, which was switched to rivaroxaban but experienced bleeding complications and menorrhagia so  was subsequently switched to apixaban. Pt reported previous intermittent use due to medication being cost prohibitive, however, oncology note states compliance during Feb and March.   Pt presented with chest pain, leg swelling. CTA revealed bilateral PE, no evidence RHS. Pharmacy initially consulted to manage heparin drip. Oncology following - recommended to transition anticoagulation to warfarin with initial enoxaparin bridge.    Today, 02/16/23 Heparin level = 0.22 is subtherapeutic. Consulted to transition anticoagulation to enoxaparin bridge + warfarin INR = 1.1 at baseline. Not falsely elevated with recent DOAC. CBC: Hgb slightly low, Plt remain elevated Diet: Regular. Meal intake not charted No major DDI with warfarin  Goal of Therapy:  INR = 2-3   Plan:  Stop heparin Initiate enoxaparin 120 mg (1 mg/kg) subQ q12h Warfarin 10 mg PO x1 today INR daily while inpatient, CBC with AM labs tomorrow  At this time, would recommend warfarin 7.5 mg PO daily at discharge + enoxaparin 120 mg subQ q12h with follow up with [facility] clinic early next  week to check INR and guide further dosing.   Lenis Noon, PharmD 02/16/23 8:24 AM

## 2023-02-16 NOTE — Progress Notes (Signed)
   [place] Medication Assistance Card  Name: Kara Mcclain ID (MRN): [id number]  [place]: [id number]  RX Group: [id number]  Discharge Date: 02/16/2023 Expiration Date: 02/23/2023 (must be filled within 7 days of discharge)  Dear Cletis Media:  You have been approved to have the prescriptions written by your discharging physician filled through our [facility] (Medication Assistance Through [facility]) program. This program allows for a one-time (no refills) 34-day supply of selected medications for a low copay amount.  The copay is $3.00 per prescription. For instance, if you have one prescription, you will pay $3.00; for two prescriptions, you pay $6.00; for three prescriptions, you pay $9.00; and so on.  Only certain pharmacies are participating in this program with [facility]. You will need to select one of the pharmacies from the attached list and take your prescriptions, this letter, and your photo ID to one of the participating pharmacies.   We are excited that you are able to use the [facility] program to get your medications. These prescriptions must be filled within 7 days of hospital discharge or they will no longer be valid for the [facility] program. Should you have any problems with your prescriptions please contact your case management team member at [phone] for [facility]/ [place] you, [place] Management

## 2023-02-16 NOTE — Plan of Care (Signed)

## 2023-02-20 ENCOUNTER — Telehealth: Payer: Self-pay | Admitting: Hematology and Oncology

## 2023-02-20 NOTE — Telephone Encounter (Signed)
Called patient per 3/28 IB message to schedule lab and clinic visit. Patient scheduled and notified.

## 2023-02-22 ENCOUNTER — Ambulatory Visit: Payer: Self-pay | Admitting: Pharmacist

## 2023-02-22 NOTE — Progress Notes (Addendum)
I have reviewed the patient's admission as well as their discharge summary. The patient states that it was "my failure" and not the failure of the apixaban/Eliquis, citing "I was not taking the apixaban/Eliquis 'correctly' for six months prior to this admission. "I realize this was poor decision making." She now states she has made contact with the manufacturer, [place], she is enrolled in their patient assistance program and the apixaban has already been drop-shipped/delivered to her home.   She has been advised that she is to commence the apixaban/Eliquis tonight and she agrees to the 9:00 PM and 9:00 AM times each day, starting tonight. She has been instructed to take TWO (2) of her five (5) milligram apixaban/Eliquis tablets TWICE DAILY at 9:00 AM and 9:00 PM for SEVEN DAYS (until her morning dose on Wednesday April 10th, 2024.)  On the evening of Wednesday, April 10th, 2024, she will commence taking ONE (1) of her five (5) milligram apixaban/Eliquis tablets TWICE DAILY.  Her INR value today (on warfarin 7.5mg  PO once daily since discharge she states with concomittant overlap of enoxaparin/Lovenox 1mg /kg SQ 12 hours--last dose of each medication, this morning) is 1.4 (repeated twice.)   She is advised to DISCONTINUE the enoxaparin/Lovenox injections as well as the warfarin/Coumadin. She has been instructed to call the clinic or report to the ED if bleeding (blood in urine, stool, nose-bleeds, throwing up blood, coughing up blood--bleeding anywhere were to occur.) Similarly, she has been instructed to report to the ED if she were to develop coughing up blood, coughing, chest-pain, shortness of breath or an increased heart rate. She verbalized all of these teaching points back on teach-back.  Discussed with the Faculty Attending Physician, Dr. Evette Doffing.

## 2023-03-02 ENCOUNTER — Ambulatory Visit: Payer: Self-pay

## 2023-03-02 VITALS — BP 135/79 | HR 90 | Temp 98.5°F | Ht 71.0 in | Wt 265.4 lb

## 2023-03-02 DIAGNOSIS — Z131 Encounter for screening for diabetes mellitus: Secondary | ICD-10-CM

## 2023-03-02 DIAGNOSIS — S82839D Other fracture of upper and lower end of unspecified fibula, subsequent encounter for closed fracture with routine healing: Secondary | ICD-10-CM

## 2023-03-02 DIAGNOSIS — I2699 Other pulmonary embolism without acute cor pulmonale: Secondary | ICD-10-CM

## 2023-03-02 DIAGNOSIS — R7303 Prediabetes: Secondary | ICD-10-CM

## 2023-03-02 DIAGNOSIS — Z0000 Encounter for general adult medical examination without abnormal findings: Secondary | ICD-10-CM

## 2023-03-02 DIAGNOSIS — I82402 Acute embolism and thrombosis of unspecified deep veins of left lower extremity: Secondary | ICD-10-CM

## 2023-03-02 DIAGNOSIS — Z13220 Encounter for screening for lipoid disorders: Secondary | ICD-10-CM

## 2023-03-02 DIAGNOSIS — S82839A Other fracture of upper and lower end of unspecified fibula, initial encounter for closed fracture: Secondary | ICD-10-CM

## 2023-03-02 DIAGNOSIS — E876 Hypokalemia: Secondary | ICD-10-CM

## 2023-03-02 DIAGNOSIS — Z7901 Long term (current) use of anticoagulants: Secondary | ICD-10-CM

## 2023-03-02 DIAGNOSIS — I82409 Acute embolism and thrombosis of unspecified deep veins of unspecified lower extremity: Secondary | ICD-10-CM

## 2023-03-02 LAB — PROTIME-INR
INR: 1.5 — ABNORMAL HIGH (ref 0.8–1.2)
Prothrombin Time: 17.9 seconds — ABNORMAL HIGH (ref 11.4–15.2)

## 2023-03-02 MED ORDER — APIXABAN 5 MG PO TABS: 5.0000 mg | ORAL_TABLET | Freq: Two times a day (BID) | ORAL | 0 refills | Status: AC

## 2023-03-02 NOTE — Patient Instructions (Addendum)
[name], it was a pleasure seeing you today! You endorsed feeling well today. Below are some of the things we talked about this visit. We look forward to seeing you in the follow up appointment!  Today we discussed: I will call with lab results  Please continue taking your eliquis as instructed by Dr. Alexandria Lodge. Let us know if you have trouble getting this.  They will call you to schedule the xray.    I have ordered the following labs today:  Lab Orders         Hemoglobin A1c         Lipid Profile         BMP8+Anion Gap         Protime-INR        Referrals ordered today:   Referral Orders  No referral(s) requested today     I have ordered the following medication/changed the following medications:   Stop the following medications: Medications Discontinued During This Encounter  Medication Reason   enoxaparin (LOVENOX) 120 MG/0.8ML injection Change in therapy   warfarin (COUMADIN) 7.5 MG tablet Completed Course     Start the following medications: Meds ordered this encounter  Medications   apixaban (ELIQUIS) 5 MG TABS tablet    Sig: Take 1 tablet (5 mg total) by mouth 2 (two) times daily.    Refill:  0     Follow-up: 6 months routine f\u   Please make sure to arrive 15 minutes prior to your next appointment. If you arrive late, you may be asked to reschedule.   We look forward to seeing you next time. Please call our clinic at [phone] if you have any questions or concerns. The best time to call is Monday-Friday from 9am-4pm, but there is someone available 24/7. If after hours or the weekend, call the main hospital number and ask for the Internal Medicine Resident On-Call. If you need medication refills, please notify your pharmacy one week in advance and they will send [place] a request.  Thank you for letting us take part in your care. Wishing you the best!  Thank you, Lyndle Herrlich MD

## 2023-03-02 NOTE — Assessment & Plan Note (Addendum)
Has underlying coagulopathy, unsure exactly what the diagnosis is.  She has history of venous thrombosis but not arterial thrombosis.  Has previously tried both Eliquis and Xarelto as well as warfarin.  Xarelto caused excessive bleeding.  She was initially prescribed Eliquis but due to insurance trouble, had to stop for some time and then had the ankle injury and likely subsequent DVT due to immobility.  She was discharged from the hospital on Lovenox and then bridged to warfarin.  Upon following up with Dr. Alexandria Lodge on 4/3, she was switched back to Eliquis.  I agree, I do not think her recent admission for DVT/PE is consistent with Eliquis failure and more so inability to access Eliquis consistently over the past year.  She was loaded on eliquis for 7 days last week and switched to maintance dose today. No abnormal bleeding.  Will continue this therapy for now as she has confirmed at least a year supply through the manufacture.  Will check INR given she was on multiple anticoagulants for the past couple weeks.

## 2023-03-02 NOTE — Assessment & Plan Note (Addendum)
Symptoms significantly better now, minimal chest pain, no dyspnea, no hemoptysis.  Respiratory exam is benign and she is satting 100% on room air.  Anticoagulation plan under "long-term anticoagulation" problem

## 2023-03-02 NOTE — Assessment & Plan Note (Addendum)
Was following up with ortho regarding this. Was on boot previously.  Able to bear weight on the ankle now.  She mentions that she was supposed to get a repeat x-ray of the ankle to make sure it is healing well.  Will order today and she can follow-up with Ortho as needed.

## 2023-03-03 DIAGNOSIS — R7303 Prediabetes: Secondary | ICD-10-CM | POA: Insufficient documentation

## 2023-03-03 LAB — BMP8+ANION GAP
Anion Gap: 15 mmol/L (ref 10.0–18.0)
BUN/Creatinine Ratio: 18 (ref 9–23)
BUN: 13 mg/dL (ref 6–24)
CO2: 20 mmol/L (ref 20–29)
Calcium: 9.6 mg/dL (ref 8.7–10.2)
Chloride: 102 mmol/L (ref 96–106)
Creatinine, Ser: 0.73 mg/dL (ref 0.57–1.00)
Glucose: 85 mg/dL (ref 70–99)
Potassium: 4.1 mmol/L (ref 3.5–5.2)
Sodium: 137 mmol/L (ref 134–144)
eGFR: 101 mL/min/{1.73_m2} (ref >59)

## 2023-03-03 LAB — LIPID PANEL
Chol/HDL Ratio: 4.7 ratio — ABNORMAL HIGH (ref 0.0–4.4)
Cholesterol, Total: 196 mg/dL (ref 100–199)
HDL: 42 mg/dL (ref >39)
LDL Chol Calc (NIH): 136 mg/dL — ABNORMAL HIGH (ref 0–99)
Triglycerides: 97 mg/dL (ref 0–149)
VLDL Cholesterol Cal: 18 mg/dL (ref 5–40)

## 2023-03-03 LAB — HEMOGLOBIN A1C
Est. average glucose Bld gHb Est-mCnc: 123 mg/dL
Hgb A1c MFr Bld: 5.9 % — ABNORMAL HIGH (ref 4.8–5.6)

## 2023-03-03 NOTE — Assessment & Plan Note (Addendum)
Will check A1c, lipids, bmp today -address colonoscopy at [facility] -she requested gyn referral, pap smear during that visit

## 2023-03-03 NOTE — Progress Notes (Signed)
CC: hospital follow up  HPI:   [name] is a 52 y.o.-year-old female with past medical history as below presenting for HFU recurrent DVT/PE.  Please see encounters tab for problem-based charting.  Past Medical History:  Diagnosis Date   Chest pain    2/2 related to PE with component of anxiety versus pericarditis  Improves with tylenol prn a few times a week EKG with long QTc and nonspecific t wave changes. 2d echo 08/23/2013 - normal  08/26/2013 referred to cards for evaluation Lipid check ordered.    DVT (deep venous thrombosis) (HCC)    "LLE" (07/04/2013)   Pulmonary embolism (HCC)    URI (upper respiratory infection) 05/07/2022   Review of Systems: As in HPI.  Please see encounters tab for problem based charting.  Physical Exam:  Vitals:   03/02/23 1432  BP: 135/79  Pulse: 90  Temp: 98.5 F (36.9 C)  TempSrc: Oral  SpO2: 100%  Weight: 265 lb 6.4 oz (120.4 kg)  Height: 5\' 11"  (1.803 m)   General:Well-appearing, pleasant, In NAD Cardiac: RRR, no murmurs rubs or gallops. Respiratory: Normal work of breathing on room air, CTAB Abdominal: Soft, nontender, nondistended Extremities: Persistent left ankle deformity.  No asymmetric swelling in bilateral lower extremities  Assessment & Plan:   Long term current use of anticoagulant therapy Has underlying coagulopathy, unsure exactly what the diagnosis is.  She has history of venous thrombosis but not arterial thrombosis.  Has previously tried both Eliquis and Xarelto as well as warfarin.  Xarelto caused excessive bleeding.  She was initially prescribed Eliquis but due to insurance trouble, had to stop for some time and then had the ankle injury and likely subsequent DVT due to immobility.  She was discharged from the hospital on Lovenox and then bridged to warfarin.  Upon following up with Dr. Alexandria Lodge on 4/3, she was switched back to Eliquis.  I agree, I do not think her recent admission for DVT/PE is consistent with Eliquis  failure and more so inability to access Eliquis consistently over the past year.  She was loaded on eliquis for 7 days last week and switched to maintance dose today. No abnormal bleeding.  Will continue this therapy for now as she has confirmed at least a year supply through the manufacture.  Will check INR given she was on multiple anticoagulants for the past couple weeks.  Recurrent pulmonary embolism (HCC) Symptoms significantly better now, minimal chest pain, no dyspnea, no hemoptysis.  Respiratory exam is benign and she is satting 100% on room air.  Anticoagulation plan under "long-term anticoagulation" problem  Closed traumatic minimally displaced fracture of distal end of fibula Was following up with ortho regarding this. Was on boot previously.  Able to bear weight on the ankle now.  She mentions that she was supposed to get a repeat x-ray of the ankle to make sure it is healing well.  Will order today and she can follow-up with Ortho as needed.  Recurrent deep vein thrombosis (DVT) (HCC) Left-sided DVT developed likely due to immobilization from left ankle fracture.  She says pain in her leg has improved.  Exam is benign with some residual deformity in the left ankle but otherwise no asymmetric pitting edema or TTP of the lower extremities.  Hypokalemia Will check BMP today  Prediabetes Addendum: A1c is 5.9, talked about lifestyle changes.  Preventative health care Will check A1c, lipids, bmp today -address colonoscopy at [facility] -she requested gyn referral, pap smear during that  visit   Patient discussed with Dr.  Lafonda Mosses

## 2023-03-03 NOTE — Assessment & Plan Note (Signed)
Will check BMP today. 

## 2023-03-03 NOTE — Assessment & Plan Note (Signed)
Addendum: A1c is 5.9, talked about lifestyle changes.

## 2023-03-03 NOTE — Assessment & Plan Note (Signed)
Left-sided DVT developed likely due to immobilization from left ankle fracture.  She says pain in her leg has improved.  Exam is benign with some residual deformity in the left ankle but otherwise no asymmetric pitting edema or TTP of the lower extremities.

## 2023-03-08 ENCOUNTER — Telehealth: Payer: Self-pay | Admitting: *Deleted

## 2023-03-08 NOTE — Telephone Encounter (Signed)
Patient calling for info on referral to GYN placed 03/03/23. Will forward to Referral Coordinator to f/u.  Patient also requesting OV notes from 12/15/22 and 03/02/23 to send to [employer]. Will need to speak with Practice Administrator to see if ROI is needed.

## 2023-03-08 NOTE — Telephone Encounter (Signed)
Submitted application for ELIQUIS to [employer] for patient assistance.   Phone: [phone]  

## 2023-03-09 NOTE — Progress Notes (Signed)
Internal Medicine Clinic Attending  Case discussed with Dr. Sridharan  At the time of the visit.  We reviewed the resident's history and exam and pertinent patient test results.  I agree with the assessment, diagnosis, and plan of care documented in the resident's note.  

## 2023-03-13 ENCOUNTER — Ambulatory Visit (HOSPITAL_COMMUNITY)
Admission: RE | Admit: 2023-03-13 | Discharge: 2023-03-13 | Disposition: A | Discharge status: Home / Self Care | Payer: Self-pay | Source: Ambulatory Visit | Attending: Internal Medicine | Admitting: Internal Medicine

## 2023-03-13 ENCOUNTER — Telehealth: Payer: Self-pay | Admitting: *Deleted

## 2023-03-13 DIAGNOSIS — S82839A Other fracture of upper and lower end of unspecified fibula, initial encounter for closed fracture: Secondary | ICD-10-CM | POA: Insufficient documentation

## 2023-03-15 ENCOUNTER — Inpatient Hospital Stay: Payer: Self-pay | Admitting: Hematology and Oncology

## 2023-03-15 ENCOUNTER — Inpatient Hospital Stay: Payer: Self-pay | Attending: Hematology and Oncology

## 2023-03-15 ENCOUNTER — Other Ambulatory Visit: Payer: Self-pay | Admitting: Hematology and Oncology

## 2023-03-15 DIAGNOSIS — I2699 Other pulmonary embolism without acute cor pulmonale: Secondary | ICD-10-CM

## 2023-03-15 NOTE — Progress Notes (Signed)
No show

## 2023-03-21 ENCOUNTER — Telehealth: Payer: Self-pay

## 2023-03-21 NOTE — Telephone Encounter (Signed)
Patient called she is requesting a call back regarding her xray results.

## 2023-04-17 ENCOUNTER — Encounter: Payer: Self-pay | Admitting: *Deleted

## 2023-04-18 ENCOUNTER — Telehealth: Payer: Self-pay

## 2023-04-18 ENCOUNTER — Telehealth: Payer: Self-pay | Admitting: *Deleted

## 2023-04-18 NOTE — Telephone Encounter (Signed)
Telephoned patient at mobile number. Mailbox full and voice mail not an option to leave [facility] contact information.

## 2024-01-15 NOTE — Telephone Encounter (Signed)
 Enrolled until 03/12/24.

## 2024-01-30 ENCOUNTER — Other Ambulatory Visit (HOSPITAL_COMMUNITY): Payer: Self-pay

## 2024-04-16 ENCOUNTER — Telehealth: Payer: Self-pay | Admitting: *Deleted

## 2024-04-16 NOTE — Telephone Encounter (Signed)
 RTC to patient message from Dr. Lydia Sams that she will need to be seen before getting an Antibiotic.  Patient was advised to look for a Dentist as the Clinics no longer have Dentist for referrals.

## 2024-04-16 NOTE — Telephone Encounter (Signed)
 Copied from CRM [id number]. Topic: Clinical - Medication Question >> Apr 16, 2024 12:35 PM Blair Bumpers wrote: Reason for CRM: Patient wanting to know if provider can prescribe her an antibiotic. She states that she has 3 teeth on one side of her face that has her face swollen. She has an upcoming appt for tomorrow to speak with provider about a referral to a dentist.

## 2024-04-17 ENCOUNTER — Ambulatory Visit: Payer: Self-pay | Admitting: Student

## 2024-04-17 NOTE — Progress Notes (Deleted)
   CC: ***  HPI:  [name] is a 53 y.o. female with past medical history of recurrent DVT on Eliquis  who presents for concerns of dental pain and swelling.  Please see assessment and plan for full HPI.  Medications: Recurrent DVT: Eliquis  5 mg twice daily   Past Medical History:  Diagnosis Date   Chest pain    2/2 related to PE with component of anxiety versus pericarditis  Improves with tylenol  prn a few times a week EKG with long QTc and nonspecific t wave changes. 2d echo 08/23/2013 - normal  08/26/2013 referred to cards for evaluation Lipid check ordered.    DVT (deep venous thrombosis) (HCC)    "LLE" (07/04/2013)   Pulmonary embolism (HCC)    URI (upper respiratory infection) 05/07/2022     Current Outpatient Medications:    acetaminophen  (TYLENOL ) 500 MG tablet, Take 1,000 mg by mouth as needed., Disp: , Rfl:    apixaban  (ELIQUIS ) 5 MG TABS tablet, Take 1 tablet (5 mg total) by mouth 2 (two) times daily., Disp: , Rfl: 0   benzonatate  (TESSALON  PERLES) 100 MG capsule, Take 1 capsule (100 mg total) by mouth 3 (three) times daily as needed for cough. (Patient not taking: Reported on 02/15/2023), Disp: 30 capsule, Rfl: 1   Cholecalciferol (D-5000) 125 MCG (5000 UT) TABS, Take 1 tablet every day by mouth. (Patient not taking: Reported on 02/15/2023), Disp: 60 tablet, Rfl: 0   traMADol  (ULTRAM ) 50 MG tablet, Take 1 tablet every 4 hours by mouth as needed., Disp: 15 tablet, Rfl: 0   VITAMIN D PO, Take 1 capsule by mouth daily., Disp: , Rfl:   Review of Systems:  ***  Constitutional: Eye: Respiratory: Cardiovascular: GI: MSK: GU: Skin: Neuro: Endocrine:   Physical Exam:  There were no vitals filed for this visit. *** General: Patient is sitting comfortably in the room  Eyes: Pupils equal and reactive to light, EOM intact  Head: Normocephalic, atraumatic  Neck: Supple, nontender, full range of motion, No JVD Cardio: Regular rate and rhythm, no murmurs, rubs or  gallops. 2+ pulses to bilateral upper and lower extremities  Chest: No chest tenderness Pulmonary: Clear to ausculation bilaterally with no rales, rhonchi, and crackles  Abdomen: Soft, nontender with normoactive bowel sounds with no rebound or guarding  Neuro: Alert and orientated x3. CN II-XII intact. Sensation intact to upper and lower extremities. 2+ patellar reflex.  Back: No midline tenderness, no step off or deformities noted. No paraspinal muscle tenderness.  Skin: No rashes noted  MSK: 5/5 strength to upper and lower extremities.    Assessment & Plan:   No problem-specific Assessment & Plan notes found for this encounter.    Patient {GC/GE:[id number]::"discussed with","seen with"} Dr. {NAMES:[id number]::"Guilloud","Hoffman","Mullen","Narendra","Williams","Vincent"}  Jonelle Neri, DO PGY-2 Internal Medicine Resident

## 2024-04-19 ENCOUNTER — Encounter: Payer: Self-pay | Admitting: Student

## 2025-02-04 ENCOUNTER — Ambulatory Visit: Payer: Self-pay | Admitting: Student
